# Patient Record
Sex: Male | Born: 1935 | Race: White | Hispanic: No | Marital: Married | Smoking: Former smoker
Health system: Southern US, Community
[De-identification: ages and names within clinical notes are randomized; demographics above are authoritative.]

## PROBLEM LIST (undated history)

## (undated) DIAGNOSIS — H409 Unspecified glaucoma: Secondary | ICD-10-CM

## (undated) DIAGNOSIS — T7840XA Allergy, unspecified, initial encounter: Secondary | ICD-10-CM

## (undated) DIAGNOSIS — I255 Ischemic cardiomyopathy: Secondary | ICD-10-CM

## (undated) DIAGNOSIS — I219 Acute myocardial infarction, unspecified: Secondary | ICD-10-CM

## (undated) DIAGNOSIS — K635 Polyp of colon: Secondary | ICD-10-CM

## (undated) DIAGNOSIS — E039 Hypothyroidism, unspecified: Secondary | ICD-10-CM

## (undated) DIAGNOSIS — E785 Hyperlipidemia, unspecified: Secondary | ICD-10-CM

## (undated) DIAGNOSIS — K5790 Diverticulosis of intestine, part unspecified, without perforation or abscess without bleeding: Secondary | ICD-10-CM

## (undated) DIAGNOSIS — I259 Chronic ischemic heart disease, unspecified: Secondary | ICD-10-CM

## (undated) DIAGNOSIS — E1140 Type 2 diabetes mellitus with diabetic neuropathy, unspecified: Secondary | ICD-10-CM

## (undated) DIAGNOSIS — H269 Unspecified cataract: Secondary | ICD-10-CM

## (undated) DIAGNOSIS — Z95810 Presence of automatic (implantable) cardiac defibrillator: Secondary | ICD-10-CM

## (undated) DIAGNOSIS — R269 Unspecified abnormalities of gait and mobility: Secondary | ICD-10-CM

## (undated) DIAGNOSIS — I509 Heart failure, unspecified: Secondary | ICD-10-CM

## (undated) DIAGNOSIS — K648 Other hemorrhoids: Secondary | ICD-10-CM

## (undated) HISTORY — DX: Allergy, unspecified, initial encounter: T78.40XA

## (undated) HISTORY — DX: Ischemic cardiomyopathy: I25.5

## (undated) HISTORY — DX: Unspecified abnormalities of gait and mobility: R26.9

## (undated) HISTORY — DX: Chronic ischemic heart disease, unspecified: I25.9

## (undated) HISTORY — PX: CHOLECYSTECTOMY, LAPAROSCOPIC: SHX56

## (undated) HISTORY — DX: Other hemorrhoids: K64.8

## (undated) HISTORY — DX: Hyperlipidemia, unspecified: E78.5

## (undated) HISTORY — PX: CARDIAC DEFIBRILLATOR PLACEMENT: SHX171

## (undated) HISTORY — DX: Unspecified glaucoma: H40.9

## (undated) HISTORY — DX: Heart failure, unspecified: I50.9

## (undated) HISTORY — DX: Hypothyroidism, unspecified: E03.9

## (undated) HISTORY — DX: Polyp of colon: K63.5

## (undated) HISTORY — PX: TONSILLECTOMY: SUR1361

## (undated) HISTORY — DX: Type 2 diabetes mellitus with diabetic neuropathy, unspecified: E11.40

## (undated) HISTORY — PX: CHOLECYSTECTOMY: SHX55

## (undated) HISTORY — DX: Unspecified cataract: H26.9

## (undated) HISTORY — DX: Diverticulosis of intestine, part unspecified, without perforation or abscess without bleeding: K57.90

## (undated) HISTORY — PX: EYE SURGERY: SHX253

## (undated) HISTORY — PX: CATARACT EXTRACTION: SUR2

## (undated) HISTORY — DX: Acute myocardial infarction, unspecified: I21.9

## (undated) HISTORY — PX: SHOULDER SURGERY: SHX246

---

## 1999-04-18 ENCOUNTER — Encounter: Admission: RE | Admit: 1999-04-18 | Discharge: 1999-07-17 | Payer: Self-pay | Admitting: Anesthesiology

## 1999-09-25 ENCOUNTER — Encounter: Admission: RE | Admit: 1999-09-25 | Discharge: 1999-12-24 | Payer: Self-pay | Admitting: Anesthesiology

## 2000-02-07 ENCOUNTER — Encounter: Admission: RE | Admit: 2000-02-07 | Discharge: 2000-05-06 | Payer: Self-pay | Admitting: Anesthesiology

## 2000-06-17 ENCOUNTER — Encounter: Admission: RE | Admit: 2000-06-17 | Discharge: 2000-09-15 | Payer: Self-pay | Admitting: Anesthesiology

## 2000-10-03 ENCOUNTER — Encounter: Admission: RE | Admit: 2000-10-03 | Discharge: 2001-01-01 | Payer: Self-pay | Admitting: Anesthesiology

## 2004-08-11 ENCOUNTER — Encounter: Admission: RE | Admit: 2004-08-11 | Discharge: 2004-08-11 | Payer: Self-pay | Admitting: Internal Medicine

## 2004-10-22 DIAGNOSIS — I219 Acute myocardial infarction, unspecified: Secondary | ICD-10-CM

## 2004-10-22 HISTORY — DX: Acute myocardial infarction, unspecified: I21.9

## 2004-11-22 DIAGNOSIS — Z95810 Presence of automatic (implantable) cardiac defibrillator: Secondary | ICD-10-CM

## 2004-11-22 HISTORY — DX: Presence of automatic (implantable) cardiac defibrillator: Z95.810

## 2004-11-29 ENCOUNTER — Inpatient Hospital Stay (HOSPITAL_COMMUNITY): Admission: EM | Admit: 2004-11-29 | Discharge: 2004-12-11 | Payer: Self-pay | Admitting: Emergency Medicine

## 2004-12-01 ENCOUNTER — Encounter: Payer: Self-pay | Admitting: Cardiology

## 2004-12-02 HISTORY — PX: CORONARY ARTERY BYPASS GRAFT: SHX141

## 2004-12-16 ENCOUNTER — Inpatient Hospital Stay (HOSPITAL_COMMUNITY): Admission: EM | Admit: 2004-12-16 | Discharge: 2004-12-19 | Payer: Self-pay | Admitting: Emergency Medicine

## 2004-12-18 ENCOUNTER — Encounter (INDEPENDENT_AMBULATORY_CARE_PROVIDER_SITE_OTHER): Payer: Self-pay | Admitting: *Deleted

## 2005-01-08 ENCOUNTER — Encounter (HOSPITAL_COMMUNITY): Admission: RE | Admit: 2005-01-08 | Discharge: 2005-04-08 | Payer: Self-pay | Admitting: Cardiology

## 2005-04-09 ENCOUNTER — Encounter (HOSPITAL_COMMUNITY): Admission: RE | Admit: 2005-04-09 | Discharge: 2005-07-08 | Payer: Self-pay | Admitting: Cardiology

## 2005-04-19 ENCOUNTER — Ambulatory Visit: Payer: Self-pay | Admitting: Internal Medicine

## 2005-05-04 ENCOUNTER — Ambulatory Visit: Payer: Self-pay | Admitting: Internal Medicine

## 2005-05-11 ENCOUNTER — Inpatient Hospital Stay (HOSPITAL_COMMUNITY): Admission: AD | Admit: 2005-05-11 | Discharge: 2005-05-12 | Payer: Self-pay | Admitting: Internal Medicine

## 2005-05-12 ENCOUNTER — Ambulatory Visit: Payer: Self-pay | Admitting: Cardiology

## 2005-05-28 ENCOUNTER — Ambulatory Visit: Payer: Self-pay

## 2005-06-04 ENCOUNTER — Ambulatory Visit: Payer: Self-pay

## 2005-06-11 ENCOUNTER — Ambulatory Visit: Payer: Self-pay

## 2005-08-13 ENCOUNTER — Ambulatory Visit: Payer: Self-pay | Admitting: Internal Medicine

## 2005-11-13 ENCOUNTER — Ambulatory Visit: Payer: Self-pay | Admitting: Internal Medicine

## 2006-02-18 ENCOUNTER — Encounter (INDEPENDENT_AMBULATORY_CARE_PROVIDER_SITE_OTHER): Payer: Self-pay | Admitting: *Deleted

## 2006-02-18 ENCOUNTER — Ambulatory Visit (HOSPITAL_COMMUNITY): Admission: RE | Admit: 2006-02-18 | Discharge: 2006-02-19 | Payer: Self-pay | Admitting: General Surgery

## 2006-03-04 ENCOUNTER — Ambulatory Visit: Payer: Self-pay | Admitting: Internal Medicine

## 2006-06-11 ENCOUNTER — Ambulatory Visit: Payer: Self-pay | Admitting: Internal Medicine

## 2006-08-23 ENCOUNTER — Ambulatory Visit: Payer: Self-pay

## 2006-08-23 ENCOUNTER — Ambulatory Visit: Payer: Self-pay | Admitting: Internal Medicine

## 2006-12-10 ENCOUNTER — Ambulatory Visit: Payer: Self-pay | Admitting: Internal Medicine

## 2007-03-11 ENCOUNTER — Ambulatory Visit: Payer: Self-pay | Admitting: Internal Medicine

## 2007-05-20 ENCOUNTER — Ambulatory Visit: Payer: Self-pay | Admitting: Internal Medicine

## 2007-06-10 ENCOUNTER — Ambulatory Visit: Payer: Self-pay | Admitting: Internal Medicine

## 2007-06-24 ENCOUNTER — Encounter: Payer: Self-pay | Admitting: Internal Medicine

## 2007-06-24 ENCOUNTER — Ambulatory Visit: Payer: Self-pay | Admitting: Internal Medicine

## 2007-07-31 ENCOUNTER — Ambulatory Visit: Payer: Self-pay | Admitting: Internal Medicine

## 2007-09-09 ENCOUNTER — Ambulatory Visit: Payer: Self-pay | Admitting: Internal Medicine

## 2007-10-28 ENCOUNTER — Ambulatory Visit: Payer: Self-pay | Admitting: Internal Medicine

## 2007-11-25 ENCOUNTER — Inpatient Hospital Stay (HOSPITAL_COMMUNITY): Admission: EM | Admit: 2007-11-25 | Discharge: 2007-11-27 | Payer: Self-pay | Admitting: Emergency Medicine

## 2007-11-25 ENCOUNTER — Ambulatory Visit: Payer: Self-pay | Admitting: Internal Medicine

## 2007-12-10 ENCOUNTER — Ambulatory Visit: Payer: Self-pay

## 2007-12-17 ENCOUNTER — Ambulatory Visit: Payer: Self-pay | Admitting: Internal Medicine

## 2008-02-02 DIAGNOSIS — F3289 Other specified depressive episodes: Secondary | ICD-10-CM | POA: Insufficient documentation

## 2008-02-02 DIAGNOSIS — I509 Heart failure, unspecified: Secondary | ICD-10-CM | POA: Insufficient documentation

## 2008-02-02 DIAGNOSIS — I2510 Atherosclerotic heart disease of native coronary artery without angina pectoris: Secondary | ICD-10-CM | POA: Insufficient documentation

## 2008-02-02 DIAGNOSIS — I219 Acute myocardial infarction, unspecified: Secondary | ICD-10-CM | POA: Insufficient documentation

## 2008-02-02 DIAGNOSIS — F329 Major depressive disorder, single episode, unspecified: Secondary | ICD-10-CM

## 2008-02-02 DIAGNOSIS — E119 Type 2 diabetes mellitus without complications: Secondary | ICD-10-CM

## 2008-03-09 ENCOUNTER — Ambulatory Visit: Payer: Self-pay | Admitting: Internal Medicine

## 2008-06-08 ENCOUNTER — Ambulatory Visit: Payer: Self-pay | Admitting: Internal Medicine

## 2008-06-08 ENCOUNTER — Encounter: Admission: RE | Admit: 2008-06-08 | Discharge: 2008-06-08 | Payer: Self-pay | Admitting: Cardiology

## 2008-09-09 ENCOUNTER — Ambulatory Visit: Payer: Self-pay | Admitting: Internal Medicine

## 2008-12-07 ENCOUNTER — Ambulatory Visit: Payer: Self-pay | Admitting: Internal Medicine

## 2008-12-15 ENCOUNTER — Encounter: Payer: Self-pay | Admitting: Internal Medicine

## 2009-03-09 ENCOUNTER — Encounter: Payer: Self-pay | Admitting: Internal Medicine

## 2009-03-15 DIAGNOSIS — E039 Hypothyroidism, unspecified: Secondary | ICD-10-CM | POA: Insufficient documentation

## 2009-03-15 DIAGNOSIS — Z95810 Presence of automatic (implantable) cardiac defibrillator: Secondary | ICD-10-CM

## 2009-03-15 DIAGNOSIS — Q740 Other congenital malformations of upper limb(s), including shoulder girdle: Secondary | ICD-10-CM | POA: Insufficient documentation

## 2009-03-15 DIAGNOSIS — E785 Hyperlipidemia, unspecified: Secondary | ICD-10-CM | POA: Insufficient documentation

## 2009-03-16 ENCOUNTER — Ambulatory Visit: Payer: Self-pay | Admitting: Internal Medicine

## 2009-06-14 ENCOUNTER — Ambulatory Visit: Payer: Self-pay | Admitting: Internal Medicine

## 2009-06-29 ENCOUNTER — Encounter: Payer: Self-pay | Admitting: Internal Medicine

## 2009-07-15 ENCOUNTER — Encounter: Admission: RE | Admit: 2009-07-15 | Discharge: 2009-07-15 | Payer: Self-pay | Admitting: Orthopedic Surgery

## 2009-09-13 ENCOUNTER — Ambulatory Visit: Payer: Self-pay | Admitting: Internal Medicine

## 2009-09-28 ENCOUNTER — Encounter: Payer: Self-pay | Admitting: Internal Medicine

## 2009-10-07 ENCOUNTER — Emergency Department (HOSPITAL_COMMUNITY): Admission: EM | Admit: 2009-10-07 | Discharge: 2009-10-07 | Payer: Self-pay | Admitting: Emergency Medicine

## 2009-12-13 ENCOUNTER — Ambulatory Visit: Payer: Self-pay | Admitting: Internal Medicine

## 2009-12-16 ENCOUNTER — Encounter: Payer: Self-pay | Admitting: Internal Medicine

## 2010-02-27 LAB — BASIC METABOLIC PANEL: Creatinine: 1.5 mg/dL — AB (ref <=1.3)

## 2010-02-27 LAB — LIPID PANEL: LDL Cholesterol: 47 mg/dL

## 2010-02-28 ENCOUNTER — Ambulatory Visit: Payer: Self-pay | Admitting: Internal Medicine

## 2010-06-01 ENCOUNTER — Ambulatory Visit: Payer: Self-pay | Admitting: Internal Medicine

## 2010-06-05 ENCOUNTER — Ambulatory Visit: Payer: Self-pay | Admitting: Cardiology

## 2010-07-05 ENCOUNTER — Encounter: Payer: Self-pay | Admitting: Internal Medicine

## 2010-07-18 ENCOUNTER — Ambulatory Visit (HOSPITAL_COMMUNITY): Admission: RE | Admit: 2010-07-18 | Discharge: 2010-07-18 | Payer: Self-pay | Admitting: Cardiology

## 2010-07-18 ENCOUNTER — Ambulatory Visit: Payer: Self-pay | Admitting: Cardiology

## 2010-09-05 ENCOUNTER — Ambulatory Visit: Payer: Self-pay | Admitting: Cardiology

## 2010-09-07 ENCOUNTER — Ambulatory Visit: Payer: Self-pay | Admitting: Internal Medicine

## 2010-10-06 ENCOUNTER — Encounter: Payer: Self-pay | Admitting: Internal Medicine

## 2010-11-12 ENCOUNTER — Encounter: Payer: Self-pay | Admitting: Surgery

## 2010-11-20 ENCOUNTER — Encounter: Payer: Self-pay | Admitting: Cardiology

## 2010-11-20 DIAGNOSIS — I255 Ischemic cardiomyopathy: Secondary | ICD-10-CM | POA: Insufficient documentation

## 2010-11-20 DIAGNOSIS — I259 Chronic ischemic heart disease, unspecified: Secondary | ICD-10-CM | POA: Insufficient documentation

## 2010-11-20 DIAGNOSIS — E1140 Type 2 diabetes mellitus with diabetic neuropathy, unspecified: Secondary | ICD-10-CM | POA: Insufficient documentation

## 2010-11-20 DIAGNOSIS — E785 Hyperlipidemia, unspecified: Secondary | ICD-10-CM | POA: Insufficient documentation

## 2010-11-20 DIAGNOSIS — E119 Type 2 diabetes mellitus without complications: Secondary | ICD-10-CM | POA: Insufficient documentation

## 2010-11-20 DIAGNOSIS — I219 Acute myocardial infarction, unspecified: Secondary | ICD-10-CM | POA: Insufficient documentation

## 2010-11-20 DIAGNOSIS — E039 Hypothyroidism, unspecified: Secondary | ICD-10-CM | POA: Insufficient documentation

## 2010-11-20 DIAGNOSIS — I119 Hypertensive heart disease without heart failure: Secondary | ICD-10-CM | POA: Insufficient documentation

## 2010-11-23 NOTE — Cardiovascular Report (Signed)
Summary: Office Visit Remote   Office Visit Remote   Imported By: Sallee Provencal 12/19/2009 11:12:01  _____________________________________________________________________  External Attachment:    Type:   Image     Comment:   External Document

## 2010-11-23 NOTE — Cardiovascular Report (Signed)
Summary: Office Visit Remote   Office Visit Remote   Imported By: Sallee Provencal 07/11/2010 14:12:35  _____________________________________________________________________  External Attachment:    Type:   Image     Comment:   External Document

## 2010-11-23 NOTE — Assessment & Plan Note (Signed)
Summary: re  Medications Added GLIMEPIRIDE 2 MG TABS (GLIMEPIRIDE) 1 by mouth two times a day SYNTHROID 125 MCG TABS (LEVOTHYROXINE SODIUM) 6 days per week AMIODARONE HCL 200 MG TABS (AMIODARONE HCL) Take 1/2 tablet by mouth daily VITAMIN D3 1000 UNIT CAPS (CHOLECALCIFEROL) once daily      Allergies Added: NKDA  Visit Type:  Follow-up Primary Provider:  Piedad Climes, MD   History of Present Illness: Gregory [name] returns today for followup.  He is a 77 yo man who is s/p ICD implant secondary to a ICM and non-sustained VT.  He developed a medtronic [phone] lead fx. and underwent ICD lead revision two years ago.  He returns today for eval.  He has seen Dr. Mare Ferrari and thought to be doing well.  He denies sob or peripheral edema.  No ICD therapies have been noted.  Current Medications (verified): 1)  Aspirin Ec 325 Mg Tbec (Aspirin) .... Take One Tablet By Mouth Daily 2)  Glimepiride 2 Mg Tabs (Glimepiride) .Marland Kitchen.. 1 By Mouth Two Times A Day 3)  Lipitor 20 Mg Tabs (Atorvastatin Calcium) .... Take One Tablet By Mouth Daily. 4)  Furosemide 40 Mg Tabs (Furosemide) .... Take 1/2 Tablet By Mouth Daily. 5)  Ramipril 1.25 Mg Caps (Ramipril) .... Take One Capsule By Mouth Daily 6)  Coreg Cr 20 Mg Xr24h-Cap (Carvedilol Phosphate) .Marland Kitchen.. 1 By Mouth Once Daily 7)  Gabapentin 300 Mg Caps (Gabapentin) .... 2 Capsules Once Daily 8)  Synthroid 125 Mcg Tabs (Levothyroxine Sodium) .... 6 Days Per Week 9)  Amiodarone Hcl 200 Mg Tabs (Amiodarone Hcl) .... Take 1/2 Tablet By Mouth Daily 10)  Vitamin D3 1000 Unit Caps (Cholecalciferol) .... 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    Vital Signs:  Patient profile:   77 year old male Height:      66 inches Weight:      184 pounds BMI:     29.81 Pulse rate:   54 / minute BP sitting:   110 / 80  (left arm)  Vitals Entered By: Margaretmary Bayley CMA (Feb 28, 2010 3:19 PM)  Physical Exam  General:  Well developed, well nourished, in no acute distress. Head:  normocephalic and atraumatic Eyes:  He wears a patch over his right eye Mouth:  Teeth, gums and palate normal. Oral mucosa normal. Neck:  Neck supple, no JVD. No masses, thyromegaly or abnormal cervical nodes. Chest Wall:  Well healed ICD incision Lungs:  Clear bilaterally to auscultation with no wheezes, rales, or rhonchi. Heart:  RRR with normal S1 and S2 and laterally displaced PMI.  No murmurs were appreciated. Abdomen:  Bowel sounds positive; abdomen soft and non-tender without masses, organomegaly, or hernias noted. No hepatosplenomegaly. Msk:  Back normal, normal gait. Muscle strength and tone normal. Pulses:  pulses normal in all 4 extremities Extremities:  No clubbing or cyanosis. Neurologic:  Alert and oriented x 3.    ICD Specifications Following MD:  Cristopher Peru, MD     ICD Vendor:  Medtronic     ICD Model Number:  [phone]     ICD  Serial Number:  [id number] ICD DOI:  05/11/2005     ICD Implanting MD:  Cristopher Peru, MD  Lead 1:    Location: RV     DOI: 05/11/2005     Model #: [id number]     Serial #: [id number]     Status: capped Lead 2:    Location: RV     DOI: 11/26/2007     Model #: [id number]     Serial #[id number]     Status: active  Indications::  ICM   ICD Follow Up Battery Voltage:  3.07 V     Charge Time:  8.59 seconds     Underlying rhythm:  SR   ICD Device Measurements Right Ventricle:  Amplitude: 8.1 mV, Impedance: 400  ohms, Threshold: 1.0 V at 0.4 msec Shock Impedance: 63 ohms   Episodes MS Episodes:  0     Percent Mode Switch:  0     Coumadin:  No Shock:  0     ATP:  0     Nonsustained:  0     Atrial Therapies:  0 Ventricular Pacing:  <0.1%  Brady Parameters Mode VVI     Lower Rate Limit:  40      Tachy Zones VF:  200     VT:  200 (VIA VF)     VT1:  176     Tech Comments:  NORMAL DEVICE FUNCTION.  NO CHANGES MADE.  CARELINK CHECK 06-01-10. Shelly Bombard  Feb 28, 2010 3:11 PM MD Comments:  Agree with above.  Impression & Recommendations:  Problem # 1:  AUTOMATIC IMPLANTABLE CARDIAC DEFIBRILLATOR SITU (ICD-V45.02) His device is stable with normal function.  Will recheck in several months.  Problem # 2:  CAD (ICD-414.00) He denies anginal symptoms.  Continue current meds. His updated medication list for this problem includes:    Aspirin Ec 325 Mg Tbec (Aspirin) .Marland Kitchen... Take one tablet by mouth daily    Ramipril 1.25 Mg Caps (Ramipril) .Marland Kitchen... Take one capsule by mouth daily    Coreg Cr 20 Mg Xr24h-cap (Carvedilol phosphate) .Marland Kitchen... 1 by mouth once daily  Problem # 3:  CHF (ICD-428.0) He remains class 2.  Continue current meds. His updated medication list for this problem includes:    Aspirin Ec 325 Mg Tbec (Aspirin) .Marland Kitchen... Take one tablet by mouth daily    Furosemide 40 Mg Tabs (Furosemide) .Marland Kitchen... Take 1/2 tablet by mouth daily.    Ramipril 1.25 Mg Caps (Ramipril) .Marland Kitchen... Take one capsule by mouth daily    Coreg Cr 20 Mg Xr24h-cap (Carvedilol phosphate) .Marland Kitchen... 1 by mouth once daily    Amiodarone Hcl 200 Mg Tabs (Amiodarone hcl) .Marland Kitchen... Take 1/2 tablet by mouth daily  Patient Instructions: 1)  Your physician recommends that you schedule a follow-up appointment in: 12 months with Dr Lovena Le

## 2010-11-23 NOTE — Cardiovascular Report (Signed)
Summary: Office Visit Remote   Office Visit Remote   Imported By: Sallee Provencal 10/09/2010 16:05:34  _____________________________________________________________________  External Attachment:    Type:   Image     Comment:   External Document

## 2010-11-23 NOTE — Letter (Signed)
Summary: Remote Device Check  [facility], [place]  [id number] [place]   [place], [place] [address]   Phone: [phone]  Fax: [phone]     October 06, 2010 MRN: [id number]   [place] [address] [place], [place]  [address]   Dear Mr. Tudisco,   Your remote transmission was recieved and reviewed by your physician.  All diagnostics were within normal limits for you.  __X___Your next transmission is scheduled for:  12-07-2010.  Please transmit at any time this day.  If you have a wireless device your transmission will be sent automatically.   Sincerely,  Shelly Bombard

## 2010-11-23 NOTE — Letter (Signed)
Summary: Remote Device Check  [facility], [place]  [id number] [place]   [place], [place] [address]   Phone: [phone]  Fax: [phone]     December 16, 2009 MRN: [id number]   [place] [address] [place], [place]  [address]   Dear Gregory [name],   Your remote transmission was recieved and reviewed by your physician.  All diagnostics were within normal limits for you.   ___X___Your next office visit is scheduled for:  MAY 2011 [place]. Please call our office to schedule an appointment.    Sincerely,  Hotel manager

## 2010-11-23 NOTE — Letter (Signed)
Summary: Remote Device Check  [facility], [place]  [id number] [place]   [place], [place] [address]   Phone: [phone]  Fax: [phone]     July 05, 2010 MRN: [id number]   [place] [address] [place], [place]  [address]   Dear Gregory [name],   Your remote transmission was recieved and reviewed by your physician.  All diagnostics were within normal limits for you.  __X___Your next transmission is scheduled for:   09-07-10.  Please transmit at any time this day.  If you have a wireless device your transmission will be sent automatically.   Sincerely,  Shelly Bombard

## 2010-11-23 NOTE — Assessment & Plan Note (Signed)
Summary: [email or web address]  Medications Added ASPIRIN EC 325 MG TBEC (ASPIRIN) Take one tablet by mouth daily GLIMEPIRIDE 2 MG TABS (GLIMEPIRIDE) 1 by mouth once daily LIPITOR 20 MG TABS (ATORVASTATIN CALCIUM) Take one tablet by mouth daily. FUROSEMIDE 40 MG TABS (FUROSEMIDE) Take 1/2 tablet by mouth daily. RAMIPRIL 1.25 MG CAPS (RAMIPRIL) Take one capsule by mouth daily COREG CR 20 MG XR24H-CAP (CARVEDILOL PHOSPHATE) 1 by mouth once daily GABAPENTIN 300 MG CAPS (GABAPENTIN) 2 capsules once daily SYNTHROID 125 MCG TABS (LEVOTHYROXINE SODIUM) 1 by mouth once daily      Allergies Added: NKDA  CC:  chest spam ?.  History of Present Illness: Mr. Leeb returns today for followup.  He is a 77 yo man who is s/p ICD implant secondary to a ICM and non-sustained VT.  He developed a medtronic [phone] lead fx. and underwent ICD lead revision one year ago.  He returns today for eval.  He notes fleeting (seconds) episodes of c/p whic is not related to exertion.  He has seen Dr. Mare Ferrari with the thought at this time appropriately being a period of watchful waiting.  He denies sob or peripheral edema.  No ICD therapies have been noted.  Current Medications (verified): 1)  Aspirin Ec 325 Mg Tbec (Aspirin) .... Take One Tablet By Mouth Daily 2)  Glimepiride 2 Mg Tabs (Glimepiride) .Marland Kitchen.. 1 By Mouth Once Daily 3)  Lipitor 20 Mg Tabs (Atorvastatin Calcium) .... Take One Tablet By Mouth Daily. 4)  Furosemide 40 Mg Tabs (Furosemide) .... Take 1/2 Tablet By Mouth Daily. 5)  Ramipril 1.25 Mg Caps (Ramipril) .... Take One Capsule By Mouth Daily 6)  Coreg Cr 20 Mg Xr24h-Cap (Carvedilol Phosphate) .Marland Kitchen.. 1 By Mouth Once Daily 7)  Gabapentin 300 Mg Caps (Gabapentin) .... 2 Capsules Once Daily 8)  Synthroid 125 Mcg Tabs (Levothyroxine Sodium) .Marland Kitchen.. 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    Vital Signs:  Patient profile:   77 year old male Height:      66 inches Weight:      187.75 pounds BMI:     30.41 Pulse rate:   67 / minute Pulse rhythm:   regular BP sitting:   119 / 73  (left arm) Cuff size:   large  Vitals Entered By: Darrick Meigs (Mar 16, 2009 2:34 PM)  Physical Exam  General:  Well developed, well nourished, in no acute distress. Head:  normocephalic and atraumatic Eyes:  He wears a patch over his right eye Mouth:  Teeth, gums and palate normal. Oral mucosa normal. Neck:  Neck supple, no JVD. No masses, thyromegaly or abnormal cervical nodes. Chest Wall:  Well healed ICD incision Lungs:  Clear bilaterally to auscultation and percussion. Heart:  RRR with normal S1 and S2 and laterally displaced PMI.  No murmurs were appreciated. Abdomen:  Bowel sounds positive; abdomen soft and non-tender without masses, organomegaly, or hernias noted. No hepatosplenomegaly. Msk:  Back normal, normal gait. Muscle strength and tone normal. Pulses:  pulses normal in all 4 extremities Extremities:  No clubbing or cyanosis. Neurologic:  Alert and oriented x 3.   ICD Specifications Following MD:  Cristopher Peru, MD     ICD Vendor:  Medtronic     ICD Model Number:  [id number]     ICD Serial Number:  [id number] H ICD DOI:  05/11/2005     ICD Implanting MD:  Cristopher Peru, MD  Lead 1:    Location: RV     DOI: 05/11/2005     Model #: [id number]     Serial #: [id number]     Status: capped Lead 2:    Location: RV     DOI: 11/26/2007     Model #: [id number]      Serial #[id number]     Status: active  Indications::  ICM   ICD Specs Remote Check?  No Battery Voltage:  3.14 V     Charge Time:  8.19 seconds     Underlying rhythm:  SR@67    ICD Device Measurements [place]:  Right Ventricle:  Amplitude: 3.9 mV, Impedance: 440 ohms, Threshold: 1.5 V at 0.3 msec Left Ventricle:  Shock Impedance: 61 ohms   Episodes Coumadin:  No Shock:  0     ATP:  0     Nonsustained:  0     Ventricular Pacing:  <0.1&  Brady Parameters Mode VVI     Lower Rate Limit:  40      Tachy Zones VF:  200     VT:  250     VT1:  176     Next Remote Date:  06/14/2009     Next Cardiology Appt Due:  02/19/2010 Tech Comments:  No changes [facility] Friendly, LPN  May 26, [id number] PM  MD Comments:  Normal device function.  Impression & Recommendations:  Problem # 1:  AUTOMATIC IMPLANTABLE CARDIAC DEFIBRILLATOR SITU (ICD-V45.02) His device is functioning normally today.  Will followup in several months.  Problem # 2:  CHF (ICD-428.0) His CHF is well controlled at the present.  Continue current meds as noted below. His updated medication list for this problem includes:    Aspirin Ec 325 Mg Tbec (Aspirin) .Marland Kitchen... Take one tablet by mouth daily    Furosemide 40 Mg Tabs (Furosemide) .Marland Kitchen... Take 1/2 tablet by mouth daily.    Ramipril 1.25 Mg Caps (Ramipril) .Marland Kitchen... Take one capsule by mouth daily    Coreg Cr 20 Mg Xr24h-cap (Carvedilol phosphate) .Marland Kitchen... 1 by mouth once daily  Problem # 3:  CAD (ICD-414.00) Currently his chest pain is fleeting and I do not think represent recurrent ischemia. His updated medication list for this problem includes:    Aspirin Ec 325 Mg Tbec (Aspirin) .Marland Kitchen... Take one tablet by mouth daily    Ramipril 1.25 Mg Caps (Ramipril) .Marland Kitchen... Take one capsule by mouth daily    Coreg Cr 20 Mg Xr24h-cap (Carvedilol phosphate) .Marland Kitchen... 1 by mouth once daily

## 2010-12-07 ENCOUNTER — Encounter: Payer: Self-pay | Admitting: Internal Medicine

## 2010-12-07 ENCOUNTER — Encounter (INDEPENDENT_AMBULATORY_CARE_PROVIDER_SITE_OTHER): Payer: Medicare Other

## 2010-12-07 DIAGNOSIS — I472 Ventricular tachycardia: Secondary | ICD-10-CM

## 2011-01-08 ENCOUNTER — Encounter: Payer: Self-pay | Admitting: *Deleted

## 2011-01-09 ENCOUNTER — Ambulatory Visit: Payer: Self-pay | Admitting: Cardiology

## 2011-01-12 ENCOUNTER — Ambulatory Visit (INDEPENDENT_AMBULATORY_CARE_PROVIDER_SITE_OTHER): Payer: Medicare Other | Admitting: Cardiology

## 2011-01-12 ENCOUNTER — Encounter: Payer: Self-pay | Admitting: Cardiology

## 2011-01-12 VITALS — BP 100/60 | HR 61 | Wt 183.0 lb

## 2011-01-12 DIAGNOSIS — I2510 Atherosclerotic heart disease of native coronary artery without angina pectoris: Secondary | ICD-10-CM

## 2011-01-12 DIAGNOSIS — I509 Heart failure, unspecified: Secondary | ICD-10-CM

## 2011-01-12 DIAGNOSIS — Z95810 Presence of automatic (implantable) cardiac defibrillator: Secondary | ICD-10-CM

## 2011-01-12 DIAGNOSIS — I4510 Unspecified right bundle-branch block: Secondary | ICD-10-CM

## 2011-01-12 NOTE — Assessment & Plan Note (Signed)
No shocks from his defibrillator.  He had a lead replacement in February 2009.  Continue followup through the pacemaker defibrillator clinic at [facility] heart [facility]

## 2011-01-12 NOTE — Progress Notes (Signed)
History of Present Illness: No longer goes to [facility].  No chest pain. NO increase in dyspnea.The patient has an ICD.  He's had no shocks from his defibrillator.  He has not had to take any recent nitroglycerin.  He states that his blood sugars at home have been good and he has not had any hypoglycemic episodes.  He no longer goes to the [facility] because it was too expensive to drive back-and-forth  Current Outpatient Prescriptions  Medication Sig Dispense Refill  . acetaminophen (TYLENOL) 500 MG tablet Take 500 mg by mouth as needed.        Marland Kitchen amiodarone (PACERONE) 200 MG tablet Take 100 mg by mouth daily.        Marland Kitchen aspirin 325 MG tablet Take 325 mg by mouth daily.        Marland Kitchen atorvastatin (LIPITOR) 20 MG tablet Take 20 mg by mouth daily.        . calcium carbonate (TUMS EX) 750 MG chewable tablet Chew 1 tablet by mouth daily.        . carvedilol (COREG) 12.5 MG tablet Take 12.5 mg by mouth 2 (two) times daily.        . Cholecalciferol (VITAMIN D-3 PO) Take by mouth daily.        . furosemide (LASIX) 40 MG tablet Take 20 mg by mouth daily.       Marland Kitchen gabapentin (NEURONTIN) 300 MG capsule Take 600 mg by mouth daily.        Marland Kitchen glimepiride (AMARYL) 2 MG tablet Take 2 mg by mouth 2 (two) times daily.        . IRON PO Take by mouth daily.        Marland Kitchen levothyroxine (SYNTHROID, LEVOTHROID) 125 MCG tablet Take 125 mcg by mouth daily. 6 DAYS OUT OF 7 DAYS       . Multiple Vitamins-Minerals (CENTRUM SILVER PO) Take by mouth daily.        . nitroGLYCERIN (NITROSTAT) 0.4 MG SL tablet Place 0.4 mg under the tongue every 5 (five) minutes as needed.        Marland Kitchen TEMAZEPAM PO Take by mouth at bedtime as needed.          No Known Allergies  Patient Active Problem List  Diagnoses  . HYPOTHYROIDISM  . DM  . DYSLIPIDEMIA  . DEPRESSION  . MI  . CAD  . CHF  . ANOMALY, ARM, CONGENITAL  . AUTOMATIC IMPLANTABLE CARDIAC DEFIBRILLATOR SITU  . Ischemic heart disease  . Myocardial infarction  . Hypothyroidism  . Diabetes  mellitus  . Dyslipidemia  . Essential hypertension  . Ischemic cardiomyopathy  . Diabetic neuropathy    History  Smoking status  . Former Smoker  Smokeless tobacco  . Not on file    History  Alcohol Use: Not on file    No family history on file.  Review of Systems: Constitutional: no fever chills diaphoresis or fatigue or change in weight.  Head and neck: no hearing loss, no epistaxis, no photophobia or visual disturbance. Respiratory: No cough, shortness of breath or wheezing. Cardiovascular: No chest pain peripheral edema, palpitations. Gastrointestinal: No abdominal distention, no abdominal pain, no change in bowel habits hematochezia or melena. Genitourinary: No dysuria, no frequency, no urgency, no nocturia. Musculoskeletal:No arthralgias, no back pain, no gait disturbance or myalgias. Neurological: No dizziness, no headaches, no numbness, no seizures, no syncope, no weakness, no tremors. Hematologic: No lymphadenopathy, no easy bruising. Psychiatric: No confusion, no hallucinations, no sleep disturbance.  Physical Exam: Weight 183, unchanged.  Pulse is 61.  Vital signs as recorded.  The general appearance reveals a well-developed well-nourished gentleman in no distress.  He wears a patch over his right eye.  Extraocular Movements are full.  There is no scleral icterus.  The mouth and pharynx are normal.  The neck is supple.  The carotids reveal no bruits.  The jugular venous pressure is normal.  The thyroid is not enlarged.  There is no lymphadenopathy.The chest is clear to percussion and auscultation. There are no rales or rhonchi. Expansion of the chest is symmetrical.The precordium is quiet.  The first heart sound is normal.  The second heart sound is physiologically split.  There is no murmur gallop rub or click.  There is no abnormal lift or heave.The abdomen is soft and nontender. Bowel sounds are normal. The liver and spleen are not enlarged. There Are no abdominal  masses. There are no bruits.The pedal pulses are good.  There is no phlebitis or edema.  There is no cyanosis or clubbing.Strength is normal and symmetrical in all extremities.  There is no lateralizing weakness.  There are no sensory deficits.The skin is warm and dry.  There is no rash.    Assessment / Plan:

## 2011-01-12 NOTE — Assessment & Plan Note (Signed)
The patient has had no recurrent angina.  Continue same medication.

## 2011-01-12 NOTE — Assessment & Plan Note (Signed)
The patient is not experiencing any orthopnea or paroxysmal nocturnal dyspnea or peripheral edema.  Continue present medication.  Continue low-sodium diet.

## 2011-01-18 NOTE — Cardiovascular Report (Signed)
Summary: Office Visit   Office Visit   Imported By: Sallee Provencal 01/09/2011 15:26:13  _____________________________________________________________________  External Attachment:    Type:   Image     Comment:   External Document

## 2011-01-18 NOTE — Letter (Signed)
Summary: Remote Device Check  [facility], [place]  [id number] [place]   [place], [place] [address]   Phone: [phone]  Fax: [phone]     January 08, 2011 MRN: [id number]   [place] [address] [place], [place]  [address]   Dear Mr. Rostron,   Your remote transmission was recieved and reviewed by your physician.  All diagnostics were within normal limits for you.   __X____Your next office visit is scheduled for: 03-09-2011 @ 930 with Dr Lovena Le.     Sincerely,  Shelly Bombard

## 2011-03-06 NOTE — Assessment & Plan Note (Signed)
[name]                         ELECTROPHYSIOLOGY OFFICE NOTE   Gregory [name], Gregory [name]                     MRN:          [id number]  DATE:10/28/2007                            DOB:          03/31/1933    [name] returns today for follow-up.  He is a very pleasant patient  of Dr. Lorelle Gibbs with a history of ischemic cardiomyopathy,  congestive heart failure and nonsustained VT who is status post ICD  insertion.  He returns today for follow-up.  His heart failure has been  fairly well-controlled.  He does have very mild episodes of dizziness at  times but mostly these are infrequent.  He denies peripheral edema.   MEDICATIONS:  1. Aspirin 325 a day.  2. Altace 1.25 daily.  3. Glyburide 2 mg daily.  4. Lipitor 20 mg daily.  5. Amiodarone 200 mg 1/2-tablet daily.  6. Coreg 20 mg CR daily.  7. Synthroid 125 mcg daily.  8. Lasix 20 mg daily.   PHYSICAL EXAM:  He is a pleasant, well-appearing man in no acute  distress.  Blood pressure was 96/53, the pulse 66 and regular, respirations were  18.  Weight was 189 pounds, unchanged from prior visits.  NECK:  Revealed no jugular distention.  LUNGS:  Clear bilaterally to auscultation. No wheezes, rales or rhonchi  are present. There is no increased work of breathing.  CARDIOVASCULAR:  Regular rate and rhythm with normal S1 and S2.  EXTREMITIES:  Demonstrated no cyanosis, clubbing or edema.   Interrogation of his defibrillator demonstrates Medtronic Maximo with R  waves of  9 millivolts, the impedance was 416, the threshold was 1 volt  at 0.2 milliseconds.  There were no intercurrent ICD therapies.   IMPRESSION:  1. Ischemic cardiomyopathy.  2. Nonsustained ventricular tachycardia.  3. Congestive heart failure.  4. Status post ICD insertion.   DISCUSSION:  Overall Gregory [name] is stable and his defibrillator is  working normally.  I will plan to see him back in the office in 1 year  for ICD  follow-up.  He will also follow-up with Dr. Mare [name] as  previously scheduled.     Gregory [name]. Gregory Le, MD  Electronically Signed    Gregory [name]  DD: 10/28/2007  DT: 10/28/2007  Job #: [id number]   cc:   Gregory [name], M.D.

## 2011-03-06 NOTE — Assessment & Plan Note (Signed)
[place]                         ELECTROPHYSIOLOGY OFFICE NOTE   JEET, HEARING                     MRN:          [id number]  DATE:03/09/2008                            DOB:          12/05/1932    Mr. Steege returns today for followup.  He is a pleasant man with a  history of congestive heart failure and ischemic cardiomyopathy and  nonsustained VT, status post ICD insertion.  He had a prophylactic  defibrillator placed back in 2006.  He subsequently has developed a 6949  lead fracture and had multiple ICD shocks and hospitalization several  weeks ago.  He underwent insertion of a new Medtronic defibrillator lead  and returns today for followup.  He denies chest pain or shortness of  breath today and otherwise had no specific complaints.   PRESENT MEDICATIONS:  1. Aspirin 325 a day.  2. Altace 1.25 daily.  3. Glyburide 2 mg a day.  4. Lipitor 20 a day.  5. Amiodarone 200 mg 1/2 tablet daily.  6. Furosemide 40 mg 1/2 tablet daily.  7. Multivitamin.  8. Gabapentin.  9. Doxycycline.  10.He is also on Synthroid 125 mcg daily.   PHYSICAL EXAMINATION:  GENERAL:  He is a pleasant man, in no acute  distress.  VITAL SIGNS:  Blood pressure was 130/59, the pulse 56 and regular, the  respirations were 18, the weight was 191 pounds.  NECK:  Revealed no jugular venous distention.  LUNGS:  Clear bilaterally to auscultation.  No wheezes, rales, or  rhonchi are present.  CARDIOVASCULAR:  Revealed a regular rate and rhythm.  Normal S1 and S2.  EXTREMITIES:  Demonstrated no edema. The ICD insertion site was healed  nicely.   Interrogation of his defibrillator demonstrates a [place].  The  R-waves were 5, the impedance 424, the threshold 1 V at 0.3.  The  battery voltage was 3.19 V.  There were no intercurrent IC therapies.  His outputs were decreased in the right ventricle to 2.5 at 0.5 today.   IMPRESSION:  1. Ischemic cardiomyopathy.  2. Nonsustained ventricular tachycardia.  3. Congestive heart failure.  4. Status post implantable cardioverter defibrillator lead function,      with recent revision after the patient presented with multiple      implantable cardioverter defibrillator shocks secondary to      noise on his defibrillator lead.  Overall, the patient is stable.      Will plan to see the patient back in the office in approximately 1      year.     Champ Mungo. Lovena Le, MD  Electronically Signed    GWT/MedQ  DD: 03/09/2008  DT: 03/09/2008  Job #: [id number]   cc:   Darlin Coco, M.D.

## 2011-03-06 NOTE — Discharge Summary (Signed)
Gregory [name], Gregory [name]              ACCOUNT NO.:  [id number]   MEDICAL RECORD NO.:  [id number]          PATIENT TYPE:  INP   LOCATION:  2034                         FACILITY:  [facility]   PHYSICIAN:  Marijo Conception. Wall, MD, FACCDATE OF BIRTH:  1933-01-09   DATE OF ADMISSION:  11/25/2007  DATE OF DISCHARGE:                               DISCHARGE SUMMARY   ALLERGIES:  This patient has no known drug allergies.   Time for this dictation and examination greater than 35 minutes.   FINAL DIAGNOSES:  1. Discharging day #1 after implant of supplementary right ventricular      lead for cardioverter defibrillator by Dr. Cristopher Peru.  Admit      with inappropriate ICD discharges x9 secondary to fracture of      Medtronic 6949 lead.  2. Venogram November 26, 2007.  No occlusion of left subclavian vein.   SECONDARY DIAGNOSES:  1. New York Heart Association class II chronic systolic congestive      heart failure.  2. Diabetes non-insulin-dependent diagnosed greater than 10 years ago.  3. Three vessel coronary artery disease status post coronary artery      bypass graft surgery in 2006.  4. Right bundle branch block.  5. Cardioverter defibrillator implanted 2006 secondary to coronary      artery disease with LAD 100% occlusion, cardiogenic shock, and      emergent CABG.  6. Post CABG atrial fibrillation.  Patient placed on amiodarone.  7. At catheterization 2006 ejection fraction 20% and ischemic      cardiomyopathy with akinesis of the anterior wall and apex.  8. Echocardiogram November 17, 2007, EF 25%.  9. Right bundle branch block.   PROCEDURES:  1. November 26, 2007:  Venogram and ICD lead revision by Dr. Cristopher Peru.  2. Defibrillator threshold study less than or equal to 15 joules.      There was a pocket revision as well.  The patient has a Maximo VR      7232 CX cardioverter defibrillator.   BRIEF HISTORY:  Gregory [name] is a 77 year old male.  He has a  history of ischemic  cardiomyopathy.  He had a myocardial infarction in  2006.  This led to an urgent left heart catheterization study showing  that the LAD and the right coronary artery were jeopardized.  The  patient was in cardiogenic shock.  He needed an intra-aortic balloon  pump, and then underwent emergent coronary artery bypass graft surgery.  At catheterization at that time his ejection fraction was 20%.  He had  akinesis of the anterior wall and apex.  In the postoperative period he  had atrial fibrillation.  He was placed on Coumadin which he still takes  as well as amiodarone.  He also had a Medtronic Maximo [id number]  cardioverter defibrillator implanted for ischemic cardiomyopathy,  nonsustained ventricular tachycardia at about the time of bypass  surgery.   Recently he had a followup 2D echocardiogram at the office of Dr.  Mare Ferrari.  His ejection fraction remained at 25%.  The patient seems  very functional, and he has suspected New York Heart Association class  II chronic systolic congestive heart failure.   Today, February 3, the patient was up and about.  He felt fine.  About  10 in the morning he was doing his morning ablutions.  He had just  shaved and then was brushing his teeth.  Suddenly he was interrupted by  ICD discharges.  He felt four discharges.  He came to the emergency  room.  Interrogation of the device showed nine inappropriate discharges.  Electrical parameters on interrogation implicate a fractured [id number]  Medtronic right ventricular lead.   HOSPITAL COURSE:  After presentation through the emergency room with ICD  discharges which were inappropriate the device was turned off.  The  patient has been feeling fine since.  He was seen by Dr. Cristopher Peru.  Diagnosis is fractured 6949 right ventricular lead.  The patient is  scheduled for revision.  This took place February 4.  Was proceeded by  venogram to check for a flow in the left subclavian vein to the heart.  Venogram  showed good flow, and the patient then received a subsequent  right ventricular lead.  He has done well in the post procedural period.  He has been in sinus rhythm discharging post procedure day #1, November 27, 2007.  His medications have stayed the same.  The incision is doing  well.  There is no hematoma there.  He will go home with his regular  medications as well as a five day course of antibiotics.  He is asked to  keep his incision dry for the next seven days, to sponge bathe until  Wednesday, February 11.  Mobility of the left arm has been discussed  with the patient.   MEDICATIONS AT DISCHARGE:  1. Keflex 500 mg tablets one tab 1/2 hour before breakfast, lunch,      dinner, and bedtime.  2. Amiodarone 200 mg daily.  3. Altace 1.25 mg daily.  4. Furosemide 400 mg daily.  5. Lipitor 20 mg daily at bedtime.  6. Synthroid 125 mcg daily.  7. Glimepiride 2 mg daily.  8. Coreg CR 20 mg daily.  9. Enteric-coated aspirin 325 mg daily.  10.Gabapentin 300 mg two tabs at bedtime.  11.Multivitamin daily.  12.Ferrous sulfate 65 mg daily.  13.He is to take Tums as he has done prior to this admission.   FOLLOWUP:  He has followup at [facility] on Wednesday, February 18, at 9:40 at the [facility] to check the  incision.  He will see Dr. Mare Ferrari Friday, May 1, at 11 o'clock.  He  is free to call Dr. Mare Ferrari if he needs to see him earlier.   LABORATORY STUDIES PERTINENT TO THIS ADMISSION:  Complete blood count:  Hemoglobin 14.6, hematocrit 42.4, white cells 11, platelets 157.  Serial  electrolytes:  Sodium 133, potassium 4, chloride 97, bicarbonate 27, BUN  13, creatinine 1.63, glucose 90.  Protime this admission 14.3.  INR 1.1.  Troponin I studies 0.61, then 0.61 after nine ICD discharges.      Sueanne Margarita, PA      Marcello Moores C. Verl Blalock, MD, [facility]  Electronically Signed    GM/MEDQ  D:  11/27/2007  T:  11/28/2007  Job:  [id number]   cc:    Darlin Coco, M.D.  Champ Mungo. Lovena Le, MD  Burnard Bunting, M.D.

## 2011-03-06 NOTE — Assessment & Plan Note (Signed)
[place]                         ELECTROPHYSIOLOGY OFFICE NOTE   BAELIN, PETROSSIAN                     MRN:          [id number]  DATE:03/11/2007                            DOB:          07-03-1933    Mr. Mckeag returns today for followup. He is a very pleasant, middle-age  man with a history of ischemic cardiomyopathy, congestive heart failure  and nonsustained VT who is status post ICD insertion. He returns today  for followup. The patient has dyspnea with exertion. He recently  underwent exercise treadmill testing with Dr. Mare Ferrari and was told  that he did not have any severe ischemia. The patient does try to be  active but notes when he tries to get out and do much in the way of  strenuous activity he gets short of breath and fatigued. His heart  failure is really class 2. He denies any intercurrent ICD therapies. He  denies peripheral edema.   PHYSICAL EXAMINATION:  GENERAL:  He is a pleasant, middle-aged man who  is somewhat obese in no distress.  VITAL SIGNS:  Blood pressure was 120/64, the pulse 57 and regular, the  weight 189 pounds.  NECK:  Revealed no jugular venous distention.  LUNGS:  Clear bilaterally to auscultation. There were no wheezes, rales  or rhonchi.  CARDIOVASCULAR:  Regular rate and rhythm with normal S1 and S2.  ABDOMEN:  Protuberant, soft, nontender, nondistended. There is no  organomegaly.  EXTREMITIES:  Demonstrated trace peripheral edema bilaterally.   Interrogation of his defibrillator demonstrates a Medtronic Maximo with  R waves greater than 10, impedance of 408 ohms and a threshold a volt at  0.3. There were no intercurrent ICD therapies. The patient does have a  6949 lead which has been reprogrammed to minimize any ICD shocks.   IMPRESSION:  1. Ischemic cardiomyopathy.  2. Congestive heart failure.  3. Nonsustained ventricular tachycardia.   DISCUSSION:  Overall, Mr. Kissell is stable. He will  followup with Dr.  Mare Ferrari with regards to his congestive heart failure. His  defibrillator is working normally. Will plan to see him back in the ICD  clinic in one year and he will followup with __________ .     Champ Mungo. Lovena Le, MD  Electronically Signed    GWT/MedQ  DD: 03/11/2007  DT: 03/11/2007  Job #: [id number]   cc:   Darlin Coco, M.D.

## 2011-03-06 NOTE — H&P (Signed)
Gregory [name], Gregory [name] NO.:  [id number]   MEDICAL RECORD NO.:  [id number]          PATIENT TYPE:  EMS   LOCATION:  MAJO                         FACILITY:  [facility]   PHYSICIAN:  [name], MD DATE OF BIRTH:  08/04/1933   DATE OF ADMISSION:  11/25/2007  DATE OF DISCHARGE:                              HISTORY & PHYSICAL   PRIMARY CARE PHYSICIAN:  Darlin Coco, M.D.   CARDIOLOGIST:  Champ Mungo. Lovena Le, MD.   ELECTROPHYSIOLOGIST:  Burnard Bunting, MD.   ALLERGIES:  NO KNOWN DRUG ALLERGIES.   PRESENTING CIRCUMSTANCE:  It sure walloped me.   HISTORY OF PRESENT ILLNESS:  Gregory [name] is a 77 year old male.  He has a history of ischemic cardiomyopathy.  He had a myocardial  infarction in 2006.  This led to an urgent left heart catheterization.  Study showed that the LAD and the right coronary artery were  jeopardized.  The patient was in cardiogenic shock.  He needed an intra-  aortic balloon pump and then underwent emergent coronary artery bypass  graft surgery.  At catheterization, his ejection fraction was 20%.  He  had akinesis of the anterior wall and apex.  In the postoperative  period, he had atrial fibrillation and was placed on Coumadin which he  still takes today.  He also had a Medtronic Maximo [id number] cardioverter  defibrillator implanted for ischemic cardiomyopathy, nonsustained  ventricular tachycardia  at about the time of his bypass surgery.   Recently, he had a follow up 2-D echocardiogram at the office of Dr.  Mare Ferrari, ejection fraction was 25%. This patient seems very functional  with suspected New York Heart Association class II chronic systolic  congestive heart failure.   Today, the patient was up and about.  He felt fine.  At about 10 in the  morning, he was doing his morning ablutions.  He had just shaved and  then was brushing his teeth.  Suddenly was interrupted by ICD  discharges.  He felt 4 discharges.  He came to the  emergency room.  Interrogation registered 9 inappropriate discharges.  Electrical  parameters on interrogation implicate a fractured [id number] Medtronic right  ventricular lead.   MEDICATIONS:  1. Amiodarone 200 mg daily.  2. Altace 1.25 mg daily.  3. Furosemide 40 mg daily.  4. Lipitor 20 mg daily.  5. Synthroid 125 mcg daily.  6. Glimepiride 2 mg daily.  7. Coreg CR 20 mg daily.  8. Enteric-coated aspirin 325 mg daily.  9. Gabapentin 300 mg 2 tablets at bedtime.  10.Multivitamin daily.  11.Ferrous sulfate 65 mg daily.  12.Calcium carbonate 600 mg daily   PAST MEDICAL HISTORY:  1. New York Heart Association class II chronic systolic congestive      heart failure.  2. Diabetes greater than 10 years, non-insulin-dependence.  3. History of myocardial infarction, three-vessel coronary artery      disease at left heart catheterization with ejection fraction 20%,      subsequent coronary artery bypass graft surgery 2006 on IABP.  4. Cardioverter-defibrillator implant 2006.  The patient presented at  that time in acute pulmonary edema.  It was secondary to 100%      occlusion of the LAD.  He underwent emergent coronary artery bypass      graft surgery 2006.  5. Post-coronary artery bypass graft surgery, atrial fibrillation on      amiodarone.  6. At left heart catheterization 2006, ejection fraction 20%, akinesis      of anterior wall and apex.  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  8. Treated hypothyroidism.  9. Dyslipidemia.   SOCIAL HISTORY:  The patient lives in [place] with his wife of 64  years.  Their 1 daughter had juvenile diabetes and passed away at age  59.  The patient and his wife both instantly grieve at the  mention of  their daughter.  The patient used to smoke a pipe, but does not smoke  currently not for the last 3 years.  No alcoholic beverages.  No drugs.  He is a retired from [employer], corporate, he was a Freight forwarder.   FAMILY HISTORY:  Both mother and  father died of cancer.  Father died of  prostate cancer, but he has 2 sisters living, no coronary artery  disease.   REVIEW OF SYSTEMS:  No fevers, chills, no unexpected weight gain or  loss.  No epistaxis, vertigo or hoarseness.  No rashes, lesions or  nonhealing ulcerations.  No chest pain, no palpitations.  No presyncope.  No syncope.  No edema in the lower extremities.  No dyspnea on exertion.  No orthopnea, no hematuria, no dysuria.  No weakness, no numbness, no  anxiety or depression.  No neuro deficits.   PHYSICAL EXAMINATION:  GENERAL:  This is an alert and oriented male in  no acute distress.  VITAL SIGNS:  Temperature is 97.8, pulse is 60 and regular, blood  pressure 104/61, respirations are 20.  LUNGS:  Clear to auscultation bilaterally.  HEART:  Regular rate and rhythm without murmur.  ABDOMEN:  Soft, nondistended.  Bowel sounds are present.  EXTREMITIES:  Show no evidence of clubbing, edema or cyanosis.  NEURO:  Grossly intact.   DIAGNOSTICS:  Telemetry is sinus rhythm.   PLAN:  Admit the patient, cycle enzymes and get admission laboratories.  His ICD has been disabled so as not to provide more inappropriate  shocks.  The patient will have a probable lead revision in the morning  November 26, 2007.  The patient has low-level congestive heart failure symptoms, but with a  right bundle branch block and increased PR interval, the patient may be  a candidate for BIV/ICD upgrade as well as the expected right  ventricular lead replacement.  Removal of the existing fractured right  ventricular lead is per Dr. Lovena Le will be doing the procedure.      Sueanne Margarita, PA    ______________________________  [name], MD    GM/MEDQ  D:  11/25/2007  T:  11/26/2007  Job:  [id number]

## 2011-03-06 NOTE — Assessment & Plan Note (Signed)
[place] OFFICE NOTE   Gregory [name], Gregory [name]                     MRN:          [id number]  DATE:05/20/2007                            DOB:          Aug 31, 1933    REFERRING PHYSICIAN:  Burnard Bunting, M.D.   REASON FOR CONSULTATION:  Minor intermittent rectal bleeding and  colonoscopy.   HISTORY:  This is a pleasant, 77 year old white male with a history of  coronary artery disease, status post myocardial infarction, status post  coronary artery bypass grafting in February of 2006, history of  congestive heart failure, status post pacemaker placement, status post  internal defibrillator placement, history of diabetes mellitus, and a  history of depression.  He is referred today through the courtesy of Dr.  Reynaldo Minium regarding intermittent rectal bleeding and the need for  colonoscopy.  Patient reports to me that he sees bright red blood  intermittently in the toilet, as well as the toilet paper.  This has  occurred intermittently over the past one and a half to two years.  It  may happen as infrequently as once every three months.  There is no  obvious rectal pain or rectal discomfort associated with the bleeding.  He has no abdominal pain or change in bowel habits, though his bowels  tend to be a bit constipated and he needs to strain for defecation.  He  has not had prior colonoscopy.  From a cardiovascular standpoint, he has  been stable.   PAST MEDICAL HISTORY:  As above.   PAST SURGICAL HISTORY:  1. Status post tonsillectomy.  2. Status post coronary artery bypass grafting.  3. Status post cholecystectomy.  4. Status post right-eye surgery (blindness).   ALLERGIES:  No known drug allergies.   CURRENT MEDICATIONS:  1. Aspirin 325 mg daily.  2. Furosemide 40 mg daily.  3. Altace 1.25 mg daily.  4. Glimepiride 2 mg daily.  5. Lipitor 20 mg daily.  6. Neurontin 300 mg in the morning and 600 mg  at night.  7. Amiodarone 100 mg daily.  8. Iron 65 mg at night.  9. Coreg 20 mg daily.  10.Lovaza two b.i.d.  11.Doxycycline.  12.Synthroid 125 micrograms.   FAMILY HISTORY:  No family history of gastrointestinal malignancy.   SOCIAL HISTORY:  Patient is married, accompanied by his wife.  They had  one daughter, who is deceased.  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  He quit smoking in 2006, after his heart attack.  Does not  use alcohol.   REVIEW OF SYSTEMS:  Per diagnostic evaluation form.   PHYSICAL EXAM:  Well-appearing male, in no acute distress.  Blood pressure is 118/54, heart rate is 60 and regular, his weight is  190 pounds, he is 5 feet 7 inches in height.  HEENT:  Sclerae are anicteric.  Conjunctivae are pink.  Oral mucosa is  intact, no adenopathy.  His right eye has a patch on it.  LUNGS:  Clear.  HEART:  Regular.  ABDOMEN:  Soft, without tenderness, mass or hernia.   IMPRESSION:  This is a 77 year old gentleman with  multiple significant  medical problems for which he is currently clinically stable.  He  presents today regarding intermittent minor rectal bleeding.  This is  most likely due to benign anorectal pathology.  More worrisome causes  should be excluded.   RECOMMENDATIONS:  Colonoscopy with polypectomy, if necessary.  The  nature of the procedure, as well as the risks, benefits, and  alternatives, have been reviewed.  He understood and agreed to proceed.  We discussed issues regarding his defibrillator and preparation options,  given his medical problems.     Docia Chuck. Henrene Pastor, MD  Electronically Signed    JNP/MedQ  DD: 05/20/2007  DT: 05/21/2007  Job #: [id number]

## 2011-03-06 NOTE — Op Note (Signed)
Gregory [name], Gregory [name] NO.:  [id number]   MEDICAL RECORD NO.:  [id number]          PATIENT TYPE:  INP   LOCATION:  2034                         FACILITY:  [facility]   PHYSICIAN:  Champ Mungo. Lovena Le, MD    DATE OF BIRTH:  08-19-1933   DATE OF PROCEDURE:  11/26/2007  DATE OF DISCHARGE:                               OPERATIVE REPORT   PROCEDURE PERFORMED:  Implantation of a new single coil defibrillator  lead and ICD pocket revision with defibrillation threshold testing.   INTRODUCTION:  The patient is 14-year male with a longstanding ischemic  cardiomyopathy and class II heart failure who presented to hospital  after having nine ICD shocks and was essentially found to have ICD lead  failure, resulting in noise being sensed as VF resulting in the  unnecessary shocks.  He is now referred for insertion of new  defibrillator lead and ICD pocket revision.   PROCEDURE:  After informed obtained the patient is taken to the  diagnostic EP lab in fasting state.  After usual preparation and  draping, 10 mL of contrast was injected into the left upper extremity  venous system demonstrating a patent left subclavian vein as documented  by the venography.  The left subclavian vein was then punctured and the  Medtronic model Sprint Quattro secure S, model [id number] 65-cm active  fixation single coil defibrillation was advanced by way of the  subclavian vein into the right ventricle.  The final site on the RV  septum the R-waves measured 11 mV and the pacing impedance was 723 ohms  with the lead actively fixed.  10 volts pacing did not stimulate the  diaphragm, the pacing threshold 0.7 volts at 0.5 milliseconds.  With the  defibrillation lead in satisfactory position it was secured to  subpectoralis fascia with a figure-of-eight silk suture and the sewing  sleeve was also secured with silk suture.  At this point electrocautery  was utilized to enter the subcutaneous pocket and remove the  previously  implanted Medtronic Maximo defibrillator.  The defibrillator was placed  in a bowl of kanamycin.  The electrocautery was then utilized to revise  the previously made subcutaneous pocket.  Having accomplished this, the  pocket was irrigated with kanamycin.  Electrocautery utilized to assure  hemostasis.  The Medtronic Maximo VR was connected to the new  defibrillator lead and placed back in the subcutaneous pocket.  Generator secured with silk suture.  Kanamycin was utilized to irrigate  the pocket and defibrillation threshold testing carried out.   After the patient was more deeply sedated with fentanyl and Versed, VF  was induced with T-wave shock and a 15 joules shock was delivered which  terminated VF and restored sinus rhythm.  At this point no additional  defibrillation threshold test was carried out and the incision was  closed with 2-0 Vicryl followed by layer of 3-0 Vicryl.  Benzoin painted  on skin Steri-Strips applied and pressure dressings placed.  The patient  was returned to his room in satisfactory condition.   COMPLICATIONS:  There were no immediate procedure complications.   RESULTS:  This demonstrates successful implantation of a Medtronic ICD  lead in a patient whose previous lead had failed.  Defibrillation  threshold testing demonstrated DFT at 15 joules.      Champ Mungo. Lovena Le, MD  Electronically Signed     GWT/MEDQ  D:  11/26/2007  T:  11/27/2007  Job:  [id number]   cc:   Darlin Coco, M.D.

## 2011-03-09 ENCOUNTER — Encounter: Payer: Self-pay | Admitting: Internal Medicine

## 2011-03-09 ENCOUNTER — Ambulatory Visit (INDEPENDENT_AMBULATORY_CARE_PROVIDER_SITE_OTHER): Payer: Medicare Other | Admitting: Internal Medicine

## 2011-03-09 DIAGNOSIS — I255 Ischemic cardiomyopathy: Secondary | ICD-10-CM

## 2011-03-09 DIAGNOSIS — I509 Heart failure, unspecified: Secondary | ICD-10-CM

## 2011-03-09 DIAGNOSIS — I2589 Other forms of chronic ischemic heart disease: Secondary | ICD-10-CM

## 2011-03-09 DIAGNOSIS — Z95810 Presence of automatic (implantable) cardiac defibrillator: Secondary | ICD-10-CM

## 2011-03-09 DIAGNOSIS — I428 Other cardiomyopathies: Secondary | ICD-10-CM

## 2011-03-09 NOTE — H&P (Signed)
[facility]  Patient:    Gregory [name], Gregory [name]                     MRN: [id number] Adm. Date:  [id number] Attending:  Nicholaus Bloom CC:         Richard A. Reynaldo Minium, M.D.  [name], M.D.   History and Physical  FOLLOW-UP EVALUATION:  [name] comes in for follow-up evaluation of his chronic neck and shoulder pain. Since his last evaluation, we have tapered him nearly off of his OxyContin and placed him on apple cider vinegar with honey. He has noted a marked improvement with regard to his pain. He did take some Vioxx at one point and noted that this helped him to a degree. The combination of apple cider vinegar and honey seems to have helped the most.  PHYSICAL EXAMINATION:  VITAL SIGNS:  Blood pressure is 138/73, heart rate is 105, respiratory rate is 18, O2 saturation is 98%, pain level is 3/10.  NEUROLOGICAL:  Neck shows no change in range of motion. Right shoulder is unchanged with regard to limitations in range of motion. Deep tendon reflexes were symmetric in the upper and lower extremities.  IMPRESSION: 1. Right shoulder pain most likely on the basis of rotator cuff tendonitis. 2. Cervical spondylosis, stable. 3. Other medical problems per Richard A. Reynaldo Minium, M.D.  DISPOSITION: 1. Go off OxyContin. 2. Continue with apple cider vinegar/honey combination. 3. He was given some Vioxx 25 mg one p.o. q.d., #30 with two refills if needed    in the event the apple cider vinegar with honey was less efficacious. DD:  11/28/00 TD:  11/29/00 Job: [id number]

## 2011-03-09 NOTE — Op Note (Signed)
NAMECAELLUM, STORK              ACCOUNT NO.:  [id number]   MEDICAL RECORD NO.:  [id number]          PATIENT TYPE:  AMB   LOCATION:  SDS                          FACILITY:  [facility]   PHYSICIAN:  Shellia Carwin, M.D. DATE OF BIRTH:  Aug 03, 1933   DATE OF PROCEDURE:  02/18/2006  DATE OF DISCHARGE:                                 OPERATIVE REPORT   OPERATIVE PROCEDURE:  Laparoscopic cholecystectomy with intraoperative  cholangiogram.   SURGEON:  Shellia Carwin, M.D.   ASSISTANT:  Odis Hollingshead, M.D.   ANESTHESIA:  General endotracheal.   PREOPERATIVE DIAGNOSES:  Gallstones, cholecystitis.   POSTOPERATIVE DIAGNOSES:  Gallstones, cholecystitis, normal cholangiogram.   CLINICAL SUMMARY:  A 77 year old male with multiple medical problems,  including diabetes, cardiac disease, and hypertension, who is brought in  with gallstones for elective cholecystectomy.   OPERATIVE FINDINGS:  The gallbladder was thin-walled, not acutely inflamed.  There were adhesions of it to the omentum, etcetera.  Then, the  cholangiogram was normal in anatomy.   DESCRIPTION OF PROCEDURE:  Under satisfactory general endotracheal  anesthesia, the patient was positioned, and prepped and draped in a standard  fashion.  He received 1 gram of Ancef preop.  A total of 26 mL of 0.5%  Marcaine with epinephrine was infiltrated for postop analgesia at the  operative sites.   A transverse incision was made above the umbilicus, midline identified and  opened, peritoneum entered, controlled with a figure-of-eight 0 Vicryl  suture and operating Hassan, ports inserted and secured, good CO2  pneumoperitoneum established and camera placed.  With excellent  visualization, two #5 ports were placed laterally and a second #10 medially.  A lateral grasper was placed and operating through the medial port, using  coagulating scissors, I took down sharply the adhesions of omentum to the  liver and gallbladder.  Then, the  dissector was used to carefully dissect  the cystic duct and artery.  The patient had a very large hepatic artery,  close to the cystic artery, and we carefully avoided that.  Multiple clips  were placed on the cystic artery, and it was cupped.  A single clip on the  cystic duct near the gallbladder.  A percutaneous catheter placed and good  cholangiogram obtained that looked normal.  The catheter was removed and the  cystic duct was controlled with multiple clips and divided.  The gallbladder  was then removed from the liver bed from below upward with coagulating  current for hemostasis and dissection.  A small hole was made in the  gallbladder and spillage of some clear bile.  The gallbladder was removed  from the liver bed and then placed in an EndoCatch bag.   The liver bed was checked for hemostasis and made dry by cautery.  Because  of concern for some postop bleeding when he returns to his anticoagulation,  Surgicel was also placed.  Hemostasis was very good.   Then, the camera moved to the upper port and a large grasper placed at the  umbilicus and the gallbladder in the bag, removed.  The operative site was  then checked.  The abdomen was lavaged with saline, suctioned dry, ports and  CO2 released.  The midline was closed with the previous figure-of-eight and  a second interrupted 0 Vicryl suture, followed by a 4-0 Vicryl subcu.  Then,  the upper medial site was checked and one 0 Vicryl suture was used to  approximate the fascia.  Again, subcu was 4-0 Vicryl, and Steri-Strips  applied to all incisions.  Sterile absorbent dressings were applied and the  patient went to the recovery room from the operating room in good condition.  Sponge and needle counts were correct.  No complications.           ______________________________  Shellia Carwin, M.D.     MRL/MEDQ  D:  02/18/2006  T:  02/18/2006  Job:  [id number]   cc:   Darlin Coco, M.D.  Fax: [phone]   Burnard Bunting, M.D.  Fax: [phone]

## 2011-03-09 NOTE — Cardiovascular Report (Signed)
NAMEHANNA, COMINS NO.:  [id number]   MEDICAL RECORD NO.:  [id number]          PATIENT TYPE:  INP   LOCATION:  2929                         FACILITY:  [facility]   PHYSICIAN:  Belva Crome III, M.D.DATE OF BIRTH:  [birth year]   DATE OF PROCEDURE:  12/02/2004  DATE OF DISCHARGE:                              CARDIAC CATHETERIZATION   PROCEDURE PERFORMED:  Insertion of intra-aortic balloon pump.   INDICATIONS FOR PROCEDURE:  Acute heart failure in this patient with severe  3-vessel coronary disease including total occlusion of the proximal RCA,  proximal circumflex and high-grade diffuse LAD disease with TIMI grade 2  flow documented by catheterization on 11/30/2004.  This evening he suddenly  developed shortness of breath, EKG demonstrates ST elevation in anterior  leads.  He is felt, not to be an emergency surgical candidate, after  discussion with Dr. Gilford Raid.  I felt that this procedure was indicated  to help improve myocardial perfusion and treat heart failure and impending  cardiogenic shock.   DESCRIPTION:  The patient was brought to the catheterization laboratory  under emergency circumstances where the right iliofemoral region was  sterilely prepped and draped.  We then placed a 6-French, intra-aortic 40  cm, intra-aortic balloon pump.  This was done under fluoroscopic guidance  without difficulty.  1:1 punting was then begun.  The patient was taken back  to the cardiac care unit in improved condition.   CONCLUSIONS:  Successful placement of intra-aortic balloon pump for the  above indication.   PLAN:  IV diuresis, IV nitroglycerin. Prognosis is guarded.  He does not  have PTI options that are reasonable. He is not deemed to be a reasonable  candidate for emergency surgery at this time either.      HWS/MEDQ  D:  12/02/2004  T:  12/03/2004  Job:  [id number]   cc:   Darlin Coco, M.D.  [id number] [address] [place] [address]  Fax:  [phone]   Thayer Headings, M.D.  [id number] [address] [place] [address]  Fax: [place], [place]  [address]  [place]  [place] [address]   Burnard Bunting, M.D.  [address]  [place]  [place] [address]  Fax: [phone]   Youlanda Roys. Deatra Ina, M.D.  [address]  [place]  [place] [address]  Fax: [phone]

## 2011-03-09 NOTE — Assessment & Plan Note (Signed)
He denies anginal symptoms. He will continue his current medications. 

## 2011-03-09 NOTE — Op Note (Signed)
NAMESHIRAZ, DORSON              ACCOUNT NO.:  [id number]   MEDICAL RECORD NO.:  [id number]          PATIENT TYPE:  INP   LOCATION:  2033                         FACILITY:  [facility]   PHYSICIAN:  Champ Mungo. Lovena Le, M.D.  DATE OF BIRTH:  [birth year]   DATE OF PROCEDURE:  05/11/2005  DATE OF DISCHARGE:                                 OPERATIVE REPORT   PROCEDURE PERFORMED:  Implantation of an implantable  cardioverter/defibrillator (ICD).   INDICATIONS:  Ischemic cardiomyopathy, status post MI.  Status post bypass  surgery with an ejection fraction of 30%, class 2 heart failure.   INTRODUCTION:  The patient is a 77 year old man status post myocardial  infarction back in February.  He underwent three-vessel bypass surgery at  that time.  Postoperatively, his ejection fraction has been 30%.  He has  class 2 heart failure.  His most recent in May demonstrated an ejection  fraction of 15%.  He is now referred for prophylactic ICD implantation.  The  patient has a history of nonsustained. VT.   PROCEDURE:  After informed consent was obtained, the patient was taken to  the diagnostic EP lab in a fasting state.  After the usual prepping and  draping __________ midazolam was given for sedation, 30 cc of lidocaine was  infiltrated into the left infraclavicular region.  A 9 cm incision was  carried out over this region.  Electrocautery was utilized to dissect down  to the fascial plane.  Then 10 cc of contrast was injected into the left  upper extremity venous system, demonstrating a patent left subclavian vein.  It subsequently punctured, and the Medtronic model 6949 65 cm axial fixation  defibrillator lead, serial [id number] was advanced into the right  ventricle.  Mapping was carried out in the right ventricle.  At the final  site, the R wave was measured at 11 mV.  The pacing impedance was 824 ohm,  and the pacing threshold was 1.5 volts at 0.5 milliseconds.  The 10 volt  pacing in this  location did not stimulate the diaphragm.  With the  defibrillator lead in satisfactory position, it was carried to the  subpectoralis fascia with a figure-of-eight silk suture.  The sew-in sleeve  was also secured with silk suture.  Electrocautery was utilized to make a  subcutaneous pocket.  Kanamycin irrigation was utilized to irrigate the  pocket, and electrocautery utilized to assure hemostasis.  The Medtronic  model 7232 single-chamber defibrillator, serial [id number] was connected to  the defibrillator lead and placed in the subcutaneous pocket.  The generator  was secured with a silk suture.  Additional kanamycin was utilized to  irrigate the pocket and defibrillator threshold testing carried out.  After  the patient was more deeply sedated with fentanyl and Versed, VF was induced  with a T-wave shock.  A 15 joule shock was delivered, which terminated  ventricular fibrillation and restored sinus rhythm.  Five minutes was  allowed to lapse, and a second defibrillation threshold testing carried out.  Again, VF was induced with a T wave shock, and again, a 15 joule shock was  delivered, which terminated ventricular fibrillation and restored sinus  rhythm.  At this point, no additional defibrillation threshold testing was  carried out.  The incision was closed with a layer of 2-0 Vicryl, followed  by a layer of 3-0 Vicryl, followed by a layer of 4-0 Vicryl.  Benzoin was  painted on the skin.  Steri-Strips were applied.  A pressure dressing was  placed.  The patient was returned to his room in satisfactory condition.   COMPLICATIONS:  There were no immediate procedural complications.   RESULTS:  This demonstrates successful implantation of a Medtronic single-  chamber defibrillator in a patient with an ischemic cardiomyopathy, class 2  heart failure, status post myocardial infarction with a history of  nonsustained ventricular tachycardia.       GWT/MEDQ  D:  05/11/2005  T:   05/11/2005  Job:  [id number]   cc:   Darlin Coco, M.D.  [id number] [address], [address] [place]  [place] [address]  Fax: [phone]

## 2011-03-09 NOTE — Consult Note (Signed)
[facility]  Patient:    Gregory [name], Gregory [name]                     MRN: [id number] Proc. Date: 10/03/00 Adm. Date:  [id number] Attending:  Nicholaus Bloom CC:         Richard A. Reynaldo Minium, M.D.  [name], M.D.   Consultation Report  FOLLOW-UP EVALUATION  Mr. Corporon comes in for follow-up evaluation of his cervical spondylosis and right shoulder pain.  Since his last evaluation, he has seen Dr. Reynaldo Minium who started him on Synthroid.  He continues to have predominantly just the right shoulder pain which he rates at a level of 4/10.  He notes that lifting heavy objects will increase his right shoulder discomfort; rest decreases it.  He is having a considerable amount of problems with constipation, but it sounds as though he has been kind of hit and miss with how he takes the laxatives.  This also may be partially related to his underlying hypothyroidism.  He complains of a lot of dry skin.  He is on OxyContin 20 mg twice a day.  In addition, he takes Vioxx, glucosamine, Centrum, Tums, extra-strength Tylenol, and Glucotrol.  The Vioxx also could be constipating him.  PHYSICAL EXAMINATION:  Blood pressure is 143/76.  Heart rate is 110, respiratory rates 18, O2 saturations 98%.  Pain level is 4/10.  He exhibits cold hands and feet with good pulses at the radial and dorsalis pedis.  Deep tendon reflexes are symmetric.  He continues to have fairly limited range of motion of his right shoulder.  IMPRESSION: 1. Right shoulder pain most likely on the basis of rotator cuff tendonitis. 2. Cervical spondylosis - stable. 3. Constipation, possibly related either the OxyContin, Vioxx, or to his    hypothyroidism. 4. Other medical problems per Dr. Reynaldo Minium.  DISPOSITION: 1. Continue on OxyContin 20 mg 1 p.o. b.i.d. #60 with no refill. 2. He was given the Senokot protocol from [facility]. 3. Follow up with me in eight weeks. 4. He was encouraged to call if he  does not get results with his protocol for    his constipation. DD:  10/03/00 TD:  10/03/00 Job: [id number]

## 2011-03-09 NOTE — Discharge Summary (Signed)
Gregory [name], Gregory [name]              ACCOUNT NO.:  [id number]   MEDICAL RECORD NO.:  [id number]          PATIENT TYPE:  INP   LOCATION:  2036                         FACILITY:  [facility]   PHYSICIAN:  Gilford Raid, M.D.     DATE OF BIRTH:  12-Nov-1932   DATE OF ADMISSION:  11/29/2004  DATE OF DISCHARGE:  12/11/2004                                 DISCHARGE SUMMARY   ADMITTING DIAGNOSES:  1.  Chest pain with ST elevation and myocardial infarction.   DISCHARGE/SECONDARY DIAGNOSES:  1.  Severe three vessel coronary artery disease, severe left ventricular      dysfunction status post acute myocardial infarction.  Status post      emergency coronary artery bypass grafting.  2.  Postoperative renal insufficiency, improving.  3.  Diabetes mellitus Type 2, poorly controlled.  4.  Hypothyroidism.  5.  Diabetic neuropathy on Neurontin.  6.  Hypercholesterolemia.  7.  Hypertension.  8.  Right bundle branch block, uncertain age.  9.  Ongoing pipe smoker.  10. 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  11. History of right shoulder surgery.  12. No known drug allergies.  13. Postoperative atrial fibrillation, resolved on amiodarone.  14. Acute pulmonary edema secondary to coronary artery disease and suspected      occlusion of his left anterior descending artery requiring emergent      intra-aortic balloon pump placement followed by emergent coronary artery      bypass graft.   PROCEDURES:  1.  December 02, 2004:  Emergency median sternotomy for coronary artery      bypass grafting x4 using a left internal mammary artery to a left      anterior descending artery, saphenous vein graft to the second diagonal      branch of the left anterior descending, saphenous vein graft to the      obtuse marginal branch of the left circumflex coronary artery, saphenous      vein graft to the distal right coronary artery, endoscopic vein      harvesting from the right leg.  Surgeon Dr. Gilford Raid.  2.   December 02, 2004:  Intraoperative transesophageal echocardiogram by Dr.      Lillia Abed.  3.  December 02, 2004:  Cardiac catheterization for insertion of intra-      aortic balloon pump by Dr. Daneen Schick, III.  4.  November 30, 2004:  Cardiac catheterization by Dr. Mertie Moores showing      severe three vessel coronary artery disease with mild to moderate mitral      regurgitation (1+ per transesophageal echocardiogram).  There was      moderate to severe left ventricular dysfunction with an ejection      fraction of 20% with akinesis/dyskinesis of anterior wall and apex.      There is severe hypokinesis of inferior wall.  There was 1-2+ mitral      regurgitation.  5.  November 30, 2004:  Carotid duplex showed bilateral mild diffuse carotid      artery disease, but no significant internal carotid artery stenosis.  Vertebral artery flow was antegrade.  6.  Ankle brachial indexes are greater than 1.0 bilaterally with normal wave      forms.   BRIEF HISTORY:  Gregory [name] is a 77 year old Caucasian male who on November 29, 2004 presented to [facility] Emergency Department with chest pain.  He  did not have any prior history of known coronary artery disease.  Apparently  around 2 p.m. that day while watching television he developed substernal  tightness not long after lunch.  He felt it might be due to indigestion but  it persisted, lasted about 45 minutes.  At that point he and his wife drove  to the emergency department.  He did take two aspirin at home prior to  leaving.  Described the pain as a dull discomfort in the center of his chest  which did not radiate to his jaw or arm.  There was no associated nausea,  vomiting, or diaphoresis.  His medical history is significant for diabetes  mellitus, hypercholesterolemia, and hypertension.  EKG in the emergency  department showed normal sinus rhythm with deep Q-waves in leads 2, 3, and  aVF which appear old and were felt consistent with  an old inferior wall  myocardial infarction.  He also had a right bundle branch block.  There was  no acute ST segment changes.  However, his enzymes were positive with a CK-  MB of 45, 13.3, and 37.9.  Troponin were 0.19-0.99 and 5.59.  Gregory [name] was  free of chest pain on arrival to the emergency department; however, it was  felt that he was suffering from myocardial infarction and was admitted for  further treatment including telemetry, IV heparin, nitroglycerin, and  aspirin with plans for future cardiac catheterization during this  hospitalization.   HOSPITAL COURSE:  On November 29, 2004 Gregory [name] is admitted to [facility] after suffering a myocardial infarction.  He was treated with IV  heparin and Integrilin and placed on a telemetry floor.  He was continued on  aspirin.  On February 9 he underwent cardiac catheterization by Dr. Acie Fredrickson  with results discussed briefly above.  Dr. Lanelle Bal of [facility] was  consulted regarding consideration of coronary artery bypass grafting this  hospitalization.  Dr. Servando Snare saw Gregory [name] on February 9 and did feel  that the patient would benefit from cardiac bypass surgery.  However, it was  felt that first an echocardiogram should be done to evaluate the mitral  regurgitation that was noted on cardiac catheterization report.  In  addition, it was felt that Gregory [name] should get better control of his  diabetes prior to surgery as his admission hemoglobin A1C was elevated at  8.7.  Once these issues were addressed, Dr. Servando Snare felt the patient should  be ready for surgery around February 14.  After discussing risks, benefits,  and alternatives with the patient he agreed to proceed.  However, just two  days later the patient developed acute pulmonary edema that awakened him  from sleep.  He was transferred to the critical care unit under the care of Dr. Pernell Dupre.  Dr. Tamala Julian felt the acute pulmonary edema was secondary to a  severe  coronary artery disease with suspected occlusion of his LAD.  Initial  plan was insertion of an intra-aortic balloon pump with emergent contact  with cardiac surgery.  Dr. Gilford Raid was the covering cardiac surgeon  that day.  After discussing the plan with Dr. Cyndia Bent, he felt  Mr. Wilkins  should first undergo placement of an intra-aortic balloon pump and then try  to stabilize the patient and resolve his pulmonary edema prior to surgery.  This was done on the 11th and he was transferred to the critical care unit  where he was medically stabilized.  His oxygenation improved and he was  weaned from 100% FiO2 down to 4 L by nasal cannula.  His oxygen saturation  remained at 98% if he showed good evidence of diuresing well.  He remained  hemodynamically stable and denied chest pain and resolution of his shortness  of breath.  Once stabilized Dr. Cyndia Bent felt that he should proceed with  emergent coronary artery bypass grafting surgery as follow-up cardiac  enzymes did show an increase that morning with a CPK now at 1544 and an MB  of 80.  He also showed new in 2 ST elevation per EKG suggesting there was  further ischemia.  Again, risks and benefits were discussed with the patient  and he agreed to proceed and was taken to the operating room emergently.   On postoperative day one Mr. Fansler remained sedated on the ventilator.  Chest x-ray showed continued pulmonary edema.  EKG showed normal sinus  rhythm with a right bundle branch block.  His ST changes had improved.  He  remained hemodynamically stable on a dopamine drip.  The intra-aortic  balloon pump remained in place.  Diuresis was continued for his pulmonary  edema using furosemide drip.   On postoperative day two Mr. Gruett had been extubated neurologically  intact.  Chest x-ray showed improving edema.  He remained on dopamine and  Levophed drip.  Otherwise, he was stable and slightly tachycardic in the low  100s.  His blood sugars  were elevated and did initially require insulin drip  but post extubation he was switched to Lantus insulin as well as sliding  scale.  His Glucophage was placed on hold due to his poor left ventricular  function and congestive heart failure.  On postoperative day three Mr.  Dicroce remained stable, although he did develop atrial fibrillation which  required IV amiodarone.  He was back in sinus rhythm shortly thereafter.  He  continued to show decrease in his weight, although was still up around 10  pounds from his baseline.  His creatinine did show some elevation to 1.4.  His baseline was 1.1.  Otherwise, he was felt to be progressing well and was  felt stable for transfer out of the intensive care unit and out to the  floor.   Once on unit 2000 Mr. Cudd made steady progression.  He did initially have  some nausea with amiodarone, but improved when switching to oral regimen. He continued to show good diuresis and resolution of his edema and  eventually his diuretic therapy was discontinued.  His creatinine did peak  at 2.0, but began decreasing to 1.8 and remained stable.  Due to his renal  insufficiency his Glucophage was held.  Instead, he was started on low dose  Amaryl with Lantus insulin.  His Lantus insulin was eventually discontinued  due to a blood sugar of 68.  He was continued on Amaryl as well as sliding  scale.  He also developed some mild hypotension which required decreasing  his beta blocker and amiodarone.  After these adjustments his blood pressure  seemed to tolerate low dose metoprolol 12.5 mg b.i.d. and amiodarone 200 mg  b.i.d.  He maintained sinus rhythm.   By December 06, 2004 Mr. Duhn chest x-ray showed near complete  resolution of his pulmonary edema with basilar atelectasis/small bilateral  pleural effusions.  His chest tubes had been removed and there was no  evidence of pneumothorax.  He continued to feel that his breathing was  improving and denied  shortness of breath.  He did develop a productive cough  with congestion which was treated with Humibid.  He did remain afebrile with  a decreasing white blood count of 10.5.  By postoperative day eight Mr.  Tomlins was felt near ready for discharge home.  He remained hemodynamically  stable and was tolerating his blood pressure regimen.  His systolic blood  pressure readings ranged between low [id number] with diastolic readings in the  60s and 70s.  He was maintaining sinus rhythm in the 80s.  He was afebrile  and saturating 95% on room air.  His creatinine had stayed stable for  several days at 1.8.  His blood sugars were moderately controlled on Amaryl  primarily ranging in the 120s-170s.  His weight was just above his baseline  of 178.  He felt that his chest congestion was improving.  On examination  his heart had a regular rate and rhythm.  His lungs were diminished at the  bases, but otherwise clear.  His abdominal examination was benign and he was  tolerating oral diet.  His extremities showed only trace lower extremity and  his incisions were healing well without signs of infection.  His pain was  managed on oral medication and his bowel and bladder were functioning  appropriately.  He was also ambulating in the hallways with both nursing and  cardiac rehabilitation assist.  His distance was improving daily.  It was  noted that he was ambulating often with the rolling walker.  This will be  arranged as is felt needed at discharge.   It is anticipated that Mr. Schneller will be ready for discharge home on  postoperative day 9, December 11, 2004 if there were no significant changes  in his status and if his renal and pulmonary function remained stable.   LABORATORIES:  February 19:  Sodium 135, potassium 3.9, chloride 102, CO2  27, blood glucose 97, BUN 28, creatinine 1.8, calcium 7.9.  Other recent laboratories showed a white blood count 10.5, hemoglobin 10.6, hematocrit  30.8, platelet  count 287.  Hemoglobin A1C 8.7.  Magnesium 2.3.  Total  bilirubin on February 11 was 2.0 with alkaline phosphatase of 97, SGOT 115,  SGPT 30, total protein 6.9, blood albumin 3.4.  Lipid profile showed a total  cholesterol of 166, triglyceride 362, HDL 31, LDL 63.  TSH 5.408, T4 6.7.   DISCHARGE MEDICATIONS:  1.  Enteric-coated aspirin 325 mg one p.o. daily.  2.  Toprol XL 25 mg one p.o. daily.  3.  Lipitor 20 mg one p.o. daily.  4.  Amiodarone 200 mg one p.o. b.i.d.  5.  Synthroid 50 mcg one p.o. daily.  6.  Amaryl 2 mg one p.o. daily.  7.  Neurontin 300 mg each morning, 600 mg at nighttime.  8.  Tylox one to two tablets p.o. q.4h. p.r.n. moderate to severe pain or      Tylenol 325 mg one to two tablets q.4h. p.r.n. mild pain.   DISCHARGE INSTRUCTIONS:  He is instructed to avoid driving or heavy lifting  more than 10 pounds.  He was encouraged to continue daily breathing and  walking exercises.  He is to follow a carbohydrate-modified  diet.  He may  shower and clean his incisions gently with mild soap and water.  He should  notify the [facility] office if he develops fever, increasing shortness of breath,  redness or drainage from his incision site, or increasing weight or edema.   FOLLOW-UP:  He is to follow up with Dr. Cyndia Bent at the [place] office in  approximately three weeks from discharge.  The [facility] office will contact him  with a specific appointment date and time.  He is to call [phone] to  schedule a two-week follow-up with Dr. Mare Ferrari with a chest x-ray.  He is  instructed to bring this chest x-ray with him to his follow-up appointment  with Dr. Cyndia Bent.  He should schedule one to two-week follow-up with Dr.  Burnard Bunting to readdress his diabetes and current glucose reading.      AWZ/MEDQ  D:  12/10/2004  T:  12/11/2004  Job:  [id number]   cc:   Darlin Coco, M.D.  [id number] [place] [address]  Fax: [phone]   Thayer Headings, M.D.  [id number]  [address], [place]  [place] [address]  Fax: [place]. Deatra Ina, M.D.  [address]  [place]  [place] [address]  Fax: [phone]   Burnard Bunting, M.D.  [address]  [place]  [place] [address]  Fax: [phone]

## 2011-03-09 NOTE — Cardiovascular Report (Signed)
[name], [name] NO.:  [id number]   MEDICAL RECORD NO.:  [id number]          PATIENT TYPE:  INP   LOCATION:  2037                         FACILITY:  [facility]   PHYSICIAN:  Thayer Headings, M.D. DATE OF BIRTH:  April 06, 1933   DATE OF PROCEDURE:  11/30/2004  DATE OF DISCHARGE:                              CARDIAC CATHETERIZATION   Gregory [name] is a 77 year old gentleman with a history of diabetes mellitus.  He was admitted to the hospital last night with subendocardial myocardial  infarction (anterior wall). He was noted to have Q-waves in the inferior  lateral leads at the time of admission. The patient became pain free with  medical therapy and is now referred for heart catheterization.   The procedure was left heart catheterization with coronary angiography.  Right femoral artery was easily cannulated using modified Seldinger  technique.   HEMODYNAMIC RESULTS:  LV pressure was 92/24 with an aortic pressure of  91/55.   Angiography:  Left main, left main coronary artery has mild luminal irregularities.   The left anterior descending artery is subtotally occluded in the proximal  segment. There is sluggish TIMI-1 flow down the down the distal LAD. The  proximal and mid LAD has the appearance of a string of beads with multiple  subtotal occlusions. They are two small diagonal vessels which have only  minor luminal irregularities.   The left circumflex artery is occluded proximally. The first obtuse marginal  artery fills very slowly via collaterals.   The right coronary artery is occluded proximally. The mid and distal vessel  filled very slowly through collateral vessels.   The left subclavian artery and left internal mammary artery were widely  patent with brisk flow.   The left ventriculogram reveals moderate left ventricular enlargement. There  is moderate to severe left ventricular dysfunction with an ejection fraction  of 20%. There is  akinesis/dyskinesis of the anterior wall and apex. There is  severe hypokinesis of the inferior wall. The anterior base of the left  ventricle contracts fairly normally. There is one to two plus mitral  regurgitation.   COMPLICATIONS:  None.   CONCLUSION:  Severe three-vessel coronary artery disease with at least mild  to moderate mitral regurgitation. The patient will need coronary artery  bypass grafting and may need a mitral valve angioplasty ring.  I have  consulted [facility]. At their request, we will get an echocardiogram for further  evaluation of his mitral regurgitation.      PJN/MEDQ  D:  11/30/2004  T:  11/30/2004  Job:  [id number]   cc:   Burnard Bunting, M.D.  [address]  [place]  [place] [address]  Fax: [phone]   [facility] office

## 2011-03-09 NOTE — Consult Note (Signed)
[facility]  Patient:    Gregory [name], Gregory [name]                     MRN: [id number] Proc. Date: 08/08/00 Adm. Date:  [id number] Attending:  Nicholaus Bloom CC:         [name], M.D.   Consultation Report  FOLLOW-UP EVALUATION  Gregory [name] comes in for a follow-up evaluation of his right shoulder pain and cervical spondylosis. He states that he has been relatively stable on his current dose except for some right knee discomfort. He rates his pain at about 3-4/10. He is tolerating the OxyContin 20 mg twice a day well. He continues on Vioxx, glucosamine, Centrum, Tums, extra strength Tylenol and Glucotrol in addition.  PHYSICAL EXAMINATION:  VITAL SIGNS:  Blood pressure 140/82, heart rate 88, respiratory rate 16, O2 saturations 99%.  He has some tenderness of his right leg along the anserine bursa but good range of motion. Deep tendon reflexes were symmetric in the upper and lower extremity. Range of motion of his shoulder continues to be mildly limited.  IMPRESSION: 1. Right shoulder pain most likely on the basis of rotator cuff tendinopathy. 2. Cervical spondylosis. 3. Other medical problems per primary care physician.  DISPOSITION: 1. Continue on OxyContin 20 mg 1 p.o. b.i.d. #60 with no refill. 2. Continue on other medications. 3. Followup with me in 8 weeks. DD:  08/08/00 TD:  08/08/00 Job: [id number]

## 2011-03-09 NOTE — Consult Note (Signed)
[facility]  Patient:    Gregory [name], Gregory [name]                     MRN: [id number] Proc. Date: 04/22/00 Adm. Date:  [id number] Attending:  Nicholaus Bloom CC:         [name], M.D.                          Consultation Report  FOLLOW-UP EVALUATION  Gregory [name] comes in for follow-up evaluation of his chronic neck pain and right shoulder discomfort.  Since his previous evaluation, the patient has noted his shoulder pain has come and gone.  He was recently diagnosed as having diabetes by Dr. ___________.  He does note that he is doing better with the OxyContin. He spoke with Gaspar Skeeters with regard to his shoulder and he did not advocate further injections at this time.  CURRENT MEDICATIONS:  Vioxx, glucosamine, OxyContin, Centrum, Tums, extra strength Tylenol, and now Glucotrol.  PHYSICAL EXAMINATION:  VITAL SIGNS:  Blood pressure 167/81, heart rate 74, respiratory rate 16.  O2 saturation is 99%.  Pain level is 4/10.  EXTREMITIES:  The patient demonstrated ability to abduct the left shoulder completely, with right limited to about 90 degrees.  He had pain on internal and external rotation.  Deep tendon reflexes were symmetric in the upper extremity.  Range of motion of the neck was mildly restricted.  He does note some constipation from the OxyContin, but otherwise is tolerating it.  IMPRESSION:  1. Right shoulder pain which I suspect is some rotator cuff tendinopathy.  2. Cervical spondylosis.  3. Other medical problems per primary care physician.  DISPOSITION:  1. Increased OxyContin to 20 mg in the evening, 1 p.o. in p.m.  2. Other medications per primary care physician.  3. Follow up with me in 8 weeks.  He is to stop by in about 3/4 weeks with     regard to another prescription for his OxyContin.  He is still driving     during the day, so we did not advocate a b.i.d. dosing.  ADDENDUM:  The patient has redness of his left eye.  He is blind  in his right eye and apparently he is using drops fairly regularly for this. DD:  04/22/00 TD:  04/22/00 Job: [id number]

## 2011-03-09 NOTE — Assessment & Plan Note (Signed)
His device is working normally. His high voltage impedance is high on the SVC coil. This has been stable.

## 2011-03-09 NOTE — Op Note (Signed)
NAMEBINYOMIN, WOODWORTH              ACCOUNT NO.:  [id number]   MEDICAL RECORD NO.:  [id number]          PATIENT TYPE:  INP   LOCATION:  2315                         FACILITY:  [facility]   PHYSICIAN:  Crissie Sickles. Conrad Rockledge, M.D.   DATE OF BIRTH:  23-Dec-1932   DATE OF PROCEDURE:  DATE OF DISCHARGE:                                 OPERATIVE REPORT   DATE OF PROCEDURE:  December 02, 2004.   TYPE OF PROCEDURE:  Transesophageal echocardiographic probe insertion and  monitoring.  Coronary artery bypass grafting body of report as covered by  Dr. Arvid Right, who placed the transesophageal echo probe into Mr. Prada  for perioperative assessment.   HISTORY:  Mr. Colocho is a 77 year old gentleman, who had an acute myocardial  infarction and known coronary artery disease, who presents for coronary  artery bypass grafting.   DESCRIPTION OF PROCEDURE:  After uneventful induction of anesthesia and  endotracheal intubation, the transesophageal echo probe was easily passed  into the stomach.  Initial short access view of the left ventricle showed  some anterior wall hypokinesis of the inferior wall, and the septum of the  left ventricle at the level of the papillary muscles contracted normally.  There was no left ventricular hypertrophy.  Turning to the mitral valve,  there was trace to 1+ central mitral regurgitation on placing color flow  Doppler across the valve; however, the valve structures looked normal.  The  valves coapted normal without color flow Doppler.  The left atrium was  normal sized.  The flow in the pulmonary veins was forward.  Intra-atrial  septum was normal.  The right side of the heart was normal.  Turning to the  aortic valve, it was tricuspid, normal in structure, and while placing color  flow Doppler across the valve in the longitudinal view there was no aortic  stenosis or aortic insufficiency.  Left ventricular outflow tract was  normal.   The patient was placed on  cardiopulmonary bypass by Dr. Cyndia Bent and underwent  coronary artery bypass grafting x5.  At the end of the bypass run, the left  ventricle seemed to contract unchanged from preop, and the patient was  discontinued off of cardiopulmonary bypass with minimal inotropes.  At that  time, the left ventricle, unchanged from preop, with mild anterior wall  hypokinesis.  The mitral valve was unchanged both structurally and on  placing color flow Doppler across the valve with trace to 1+ central mitral  regurgitation.  Left ventricular outflow tract was normal.  The left atrium  and right side of the heart were normal.   At the end of the procedure, the transesophageal echo probe was easily  removed from the patient, and he was taken intubated in a stable condition  to the intensive care unit.      KDO/MEDQ  D:  12/02/2004  T:  12/03/2004  Job:  [id number]   cc:   Anesthesia Department

## 2011-03-09 NOTE — Discharge Summary (Signed)
[name], [name] NO.:  [id number]   MEDICAL RECORD NO.:  [id number]          PATIENT TYPE:  INP   LOCATION:  2038                         FACILITY:  [facility]   PHYSICIAN:  Kaylyn Lim., M.D.DATE OF BIRTH:  1933-01-15   DATE OF ADMISSION:  12/16/2004  DATE OF DISCHARGE:  12/19/2004                                 DISCHARGE SUMMARY   DISCHARGE DIAGNOSES:  1.  Congestive heart failure exacerbation.  2.  History of ischemic cardiomyopathy with an ejection fraction of      approximately 30%.  3.  Coronary artery disease, status post recent coronary artery bypass      grafting.  4.  Dyslipidemia.  5.  Diabetes mellitus type 2.  6.  Bronchitis.   HISTORY OF PRESENT ILLNESS:  The patient is 77 year old, white male recently  discharged from hospital on December 10, 2004,  after undergoing coronary  artery bypass grafting.  He presented back on December 16, 2004, with  complaint of malaise, orthopnea, increasing lower extremity edema and  dyspnea on exertion.  The patient was admitted to the hospital for CHF  exacerbation.   HOSPITAL COURSE:  The patient was admitted to telemetry bed.  Serial cardiac  enzymes were obtained and were negative.  The patient had a BNP level of  1600 on admission.  His medications were changed to Coreg 3.125 mg b.i.d.,  Altace 1.25 mg daily, digoxin 0.125 mg daily and Lasix. He had significant  diuresis.  On hospital day #1 and #2, his chest x-ray showed significant  improvement.  He underwent echocardiogram on December 18, 2004, showing mild  MR and TR and an EF of approximately 30%.  He did have vague anteroseptal  and apical wall motion abnormalities noted.  These were unchanged from prior  echocardiogram.  His posterior inferior walls did show some improvement from  last echocardiogram.  He has now been up and ambulatory.  His weight is  decreased from 183 pounds down to 179 pounds.  He has no further dyspnea.   DISCHARGE  MEDICATIONS:  1.  Aspirin 325 mg daily.  2.  Amaryl 2 mg daily.  3.  Lipitor 20 mg daily.  4.  Neurontin 300 in the morning, 600 in the evening.  5.  Synthroid 50 mcg daily.  6.  Amiodarone 200 mg daily.  7.  Azithromycin 500 mg daily x2 days.  8.  Digoxin 0.125 mg daily.  9.  Coreg 3.125 mg b.i.d.  10. Altace 1.25 mg daily.  11. Lasix 40 mg daily.   I did go over symptoms of CHF with him at length.  I instructed him should  he have any weight gain greater than 3 pounds in 24 hours or increasing  lower extremity edema or orthopnea, he is to take in at an additional Lasix  pill that day.   DISCHARGE LABORATORY DATA AND X-RAY FINDINGS:  His laboratory values on  discharge were stable with a hemoglobin of 11.0, white count of 9.8,  platelets 389.  BUN and creatinine are 17 and 1.6.   ACTIVITY:  Post CABG restrictions.  He is to resume home PT as before.   DIET:  ADA, 2000 calorie, low salt, low cholesterol.   WOUND CARE:  Routine post bypass.   INSTRUCTIONS:  Special instructions were regarding daily weights as well as  special instructions regarding his Lasix as listed above.   FOLLOW UP:  He will follow up with his scheduled appointment with Dr.  Burnard Bunting at [facility] on December 25, 2004, and follow up with Dr.  Warren Danes at [facility] on December 28, 2004.  I did encourage  the patient to call the office should he have any further questions. These  instructions were given to the patient as well as his wife.      TWK/MEDQ  D:  12/19/2004  T:  12/19/2004  Job:  [id number]   cc:   Burnard Bunting, M.D.  [address]  [place]  [place] [address]  Fax: [phone]   Darlin Coco, M.D.  [id number], [address] [place]  [place] [address]  Fax: [phone]

## 2011-03-09 NOTE — Progress Notes (Signed)
HPI Gregory [name] returns today for followup. He is a 77 year old male with history of ischemic cardiomyopathy, congestive heart failure, ventricular tachycardia, status post ICD implantation. The patient denies chest pain or shortness of breath. He continues to work and cut his grass and work in the yard.  He has had no ICD shocks. No Known Allergies   Current Outpatient Prescriptions  Medication Sig Dispense Refill  . acetaminophen (TYLENOL) 500 MG tablet Take 500 mg by mouth as needed.        Marland Kitchen amiodarone (PACERONE) 200 MG tablet Take 100 mg by mouth daily.        Marland Kitchen aspirin 325 MG tablet Take 325 mg by mouth daily.        Marland Kitchen atorvastatin (LIPITOR) 20 MG tablet Take 20 mg by mouth daily.        . calcium carbonate (TUMS EX) 750 MG chewable tablet Chew 1 tablet by mouth daily.        . carvedilol (COREG) 12.5 MG tablet Take 12.5 mg by mouth 2 (two) times daily.        . Cholecalciferol (VITAMIN D-3 PO) Take by mouth daily.        . dorzolamide-timolol (COSOPT) 22.3-6.8 MG/ML ophthalmic solution       . furosemide (LASIX) 40 MG tablet Take 20 mg by mouth daily.       Marland Kitchen gabapentin (NEURONTIN) 300 MG capsule Take 600 mg by mouth daily.        Marland Kitchen glimepiride (AMARYL) 2 MG tablet Take 2 mg by mouth 2 (two) times daily.        . IRON PO Take by mouth daily.        Marland Kitchen levothyroxine (SYNTHROID, LEVOTHROID) 125 MCG tablet Take 125 mcg by mouth daily. 6 DAYS OUT OF 7 DAYS       . LUMIGAN 0.01 % SOLN       . Multiple Vitamins-Minerals (CENTRUM SILVER PO) Take by mouth daily.        . nitroGLYCERIN (NITROSTAT) 0.4 MG SL tablet Place 0.4 mg under the tongue every 5 (five) minutes as needed.        . ramipril (ALTACE) 1.25 MG capsule       . TEMAZEPAM PO Take by mouth at bedtime as needed.           Past Medical History  Diagnosis Date  . Ischemic heart disease   . Myocardial infarction     LARGE ANTERIOR WALL  . Hypothyroidism   . Diabetes mellitus   . Dyslipidemia   . Essential hypertension   .  Ischemic cardiomyopathy   . Diabetic neuropathy     ROS:   All systems reviewed and negative except as noted in the HPI.   Past Surgical History  Procedure Date  . Coronary artery bypass graft 12/02/04  . Cardiac defibrillator placement   . Cholecystectomy, laparoscopic   . 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 Shoulder surgery     RIGHT     No family history on file.   History   Social History  . Marital Status: Married    Spouse Name: N/A    Number of Children: N/A  . Years of Education: N/A   Occupational History  . Not on file.   Social History Main Topics  . Smoking status: Former Research scientist (life sciences)  . Smokeless tobacco: Not on file  . Alcohol Use: Not on file  . Drug Use: Not on  file  . Sexually Active: Not on file   Other Topics Concern  . Not on file   Social History Narrative  . No narrative on file     BP 124/58  Pulse 64  Ht 5\' 6"  (1.676 m)  Wt 184 lb 1.9 oz (83.516 kg)  BMI 29.72 kg/m2  Physical Exam:  Well appearing NAD HEENT: Unremarkable Neck:  No JVD, no thyromegally Lymphatics:  No adenopathy Back:  No CVA tenderness Lungs:  Clear. Well-healed ICD incision. HEART:  Regular rate rhythm, no murmurs, no rubs, no clicks Abd:  Flat, positive bowel sounds, no organomegally, no rebound, no guarding Ext:  2 plus pulses, no edema, no cyanosis, no clubbing Skin:  No rashes no nodules Neuro:  CN II through XII intact, motor grossly intact DEVICE  Normal device function.  See PaceArt for details.   Assess/Plan:

## 2011-03-09 NOTE — Assessment & Plan Note (Signed)
He has class II congestive heart failure symptoms. I've asked that he maintain a low-salt diet. He does admit to some dietary indiscretion. Continue his current medications.

## 2011-03-09 NOTE — H&P (Signed)
[facility]  Patient:    Gregory [name], SERVICE                     MRN: [id number] Adm. Date:  [id number] Attending:  Nicholaus Bloom CC:         [name], M.D.   History and Physical  FOLLOW-UP EVALUATION:  [name] comes in for follow-up evaluation of his chronic right shoulder pain on the basis of rotator cuff tendopathy and cervical spondylosis. He is tolerating the OxyContin well, although he does have some constipation from it. He is noticing that it is not sedating him to any great extent, and he feels comfortable continuing on this if not increasing it to one twice a day. He is not driving as much as he was in the past.  PHYSICAL EXAMINATION:  VITAL SIGNS:  Blood pressure is 153/67, heart rate is 78, respiratory rate is 14, O2 saturation 99%, pain level is 6/10, temperature 99.1.  NEUROLOGICAL:  The patient continues to have limitation in range of motion of his right shoulder with abduction to 90 degrees. He has pain on internal/external rotation. Neck demonstrates no change.  Additional history revealed that he is getting some numbness and tingling in his feet bilaterally.  His other medications include Vioxx, glucosamine, Centrum, Tums, Extra-Strength Tylenol, and Glucotrol.  IMPRESSION: 1. Right shoulder pain on the basis of rotator cuff tendopathy. 2. Cervical spondylosis. 3. Other medical problems per primary care physician.  DISPOSITION: 1. Increase OxyContin to 20 mg one p.o. b.i.d., #60 with no refill. 2. Other medications per primary care physician. 3. Follow up with me in eight weeks. He is to stop by in three to four weeks    for another prescription for his OxyContin. DD:  06/17/00 TD:  06/17/00 Job: [id number]

## 2011-03-09 NOTE — Op Note (Signed)
Gregory [name], Gregory [name]              ACCOUNT NO.:  [id number]   MEDICAL RECORD NO.:  [id number]          PATIENT TYPE:  INP   LOCATION:  2315                         FACILITY:  [facility]   PHYSICIAN:  Gilford Raid, M.D.     DATE OF BIRTH:  June 18, 1933   DATE OF PROCEDURE:  12/02/2004  DATE OF DISCHARGE:                                 OPERATIVE REPORT   PREOPERATIVE DIAGNOSIS:  Severe three-vessel coronary artery disease and  severe left ventricular dysfunction; status post acute myocardial  infarction.   POSTOPERATIVE DIAGNOSIS:  Severe three-vessel coronary artery disease and  severe left ventricular dysfunction; status post acute myocardial  infarction.   OPERATIVE PROCEDURE:  1.  Emergency median sternotomy.  2.  Extracorporeal circulation.  3.  Coronary artery bypass graft surgery x4 using:      1.  Left internal mammary graft to the left anterior descending coronary          artery.      2.  Saphenous vein graft to the second diagonal branch of the LAD.      3.  Saphenous vein graft to the obtuse marginal branch of the left          circumflex coronary artery.      4.  Saphenous vein graft to the distal right coronary artery.  4.  Endoscopic vein harvesting from the right leg.   ATTENDING SURGEON:  Gilford Raid, M.D.   ASSISTANT:  Leta Baptist, PA   ANESTHESIA:  General endotracheal.   CLINICAL HISTORY:  This patient is a 77 year old gentleman with a long  history of diabetes; who presented with a one-year history of exertional  substernal chest discomfort.  He was admitted with an acute myocardial  infarction; with a total CK of 791, an MB fraction of 62.  His Troponin was  5.59.   An electrocardiogram was consistent with a non-Q wave myocardial infarction.  He was started on heparin and Integrillin; stabilized without recurrent  pain.  He underwent cardiac catheterization by Dr. Acie Fredrickson, and this showed  severe three-vessel coronary artery disease.  The LAD was  subtotally  occluded proximally, with sluggish TIMI-I to II flow distally.  There were  two small diagonals, both of which appeared to have proximal disease in  them.  The left circumflex was occluded proximally, with a large marginal  branch filling by collaterals.  The right coronary artery was occluded  proximally, with the mid and distal vessel filling by collaterals.  Left  ventricular ejection fraction was about 20%, with moderate to severe  dysfunction and moderate LV enlargement.  There was akinesis to dyskinesis  of the anterior wall and apex.  There was severe hypokinesis of the inferior  wall, and the base contracted fairly normally.  There was 1+ mitral  regurgitation.   An echocardiogram was obtained, which showed mild mitral regurgitation.  It  was read as showing an ejection fraction of 30-40%, but I reviewed this echo  and clearly the ejection fraction was not that good.  The patient was seen  by Dr. Servando Snare in consultation, and was scheduled for  surgery next Tuesday;  but, overnight the patient developed acute pulmonary edema that awakened him  from sleep.  He was transferred to the CCU under the care of Dr. Pernell Dupre.  After we discussed the patient's case, I felt that the best treatment would  be to insert an intra-aortic balloon pump and try to stabilize him and  resolve his pulmonary edema prior to doing surgery.  The balloon pump was  inserted and the patient taken to the CCU, where he was medically  stabilized.  His oxygenation improved and he was weaned from 100% FI-O2 down  to 4 L, with oxygen saturation of 98%.  He was diuresing well.  He remained  hemodynamically stable without chest pain or shortness of breath.   I felt that the best course of action would be to proceed with surgery  today, given that his entire heart was supplied by a subtotally occluded  LAD.  His CPK enzymes did increase this morning again to 1544, with an MB of  80.  He developed new  anterior ST elevation, suggesting that he had further  ischemia.  I discussed the operative procedure of emergency coronary bypass  surgery with the patient and his family; including the alternatives,  benefits and risks; these included bleeding, blood transfusion, infection,  stroke, myocardial infarction, organ failure and death.  I discussed the  high risk of the surgery with them, and they all understood and agreed to  proceed.   OPERATION PROCEDURE:  The patient was taken to the operating room and placed  on the table in supine position.  After induction of general endotracheal  anesthesia, a Foley catheter was removed from the bladder and a new catheter  was placed.  Then the chest, abdomen and both lower extremities were prepped  and draped in the usual sterile manner.  Transesophageal echocardiogram was  performed by anesthesiology.  This showed severe left ventricular  dysfunction, with ejection fraction of about 25%.  There was akinesis of the  anterior wall and apex, and hypokinesis of the lateral wall and inferior  wall.  Right ventricular function was well preserved.  There was 1+ mitral  regurgitation.   Then the chest was opened through a median sternotomy incision, and the  pericardium opened in the midline.  Examination of the heart showed good  ventricular contractility.  The ascending aorta had no palpable plaques in  it.   Then the left internal mammary artery was harvested from the chest wall with  a pedicle graft.  This was a medium-caliber vessel, with excellent blood  flow through it.  At the same time, a segment of greater saphenous vein was  harvested from the right leg, and this vein was of medium size and good  quality.   Then the patient was heparinized and when an adequate activated clotting  time was achieved, the distal ascending aorta was cannulated using a 20-  [place] aortic cannula for arterial inflow.  Venous outflow was achieved using a two-stage  venous cannula to the right atrial appendage.  An  antegrade cardioplegia and vent cannulae were inserted in the aortic root.  A retrograde cardioplegia cannula was inserted through the right atrium and  into the coronary sinus.   Then the patient was placed on cardiopulmonary bypass, and the distal  coronary was identified.  The LAD was a medium-sized vessel that was lying  deep beneath the epicardial fat throughout its course, and in the  intraventricular groove.  It was heavily  diseased proximally.  The first  diagonal was small and lying deep beneath the fat and was not felt to be  graftable.  The second diagonal was slightly larger and felt to be  graftable, since it was visible on the surface of the heart.  The obtuse  marginal branch was a large branch that was graftable, with mild distal  disease in it.  The right coronary artery gave off a small posterior  descending and a couple of small posterolateral branches.  None of these  branches were directly graftable.  The distal right coronary artery just  before the takeoff of the posterior descending branch was visualized, and  was suitable for grafting.   Then the aorta was crossclamped, and 300 cc of cold blood antegrade  cardioplegia was administered in the aortic root for quick arrest of the  heart.  This was followed by 300 cc of cold blood retrograde cardioplegia.  Systemic hypothermia to 20 degrees Centigrade and topical hypothermic iced  saline was used.  A temperature probe was placed in the septum, insulated  and padding the pericardium.  Additional doses of retrograde cardioplegia  were given at about 20 min intervals, maintaining myocardial temperature  around 10 degrees Centigrade.   Then, the first distal anastomosis was performed to the obtuse marginal  branch.  The internal diameter was about 1.75 mm.  The conduit used was a  segment of greater saphenous vein; the anastomosis performed in an end-to-  side manner  using continuous 7-0 Prolene suture.  Flow was administered  through the graft and was excellent.   The second distal anastomosis was performed to the second diagonal branch.  The internal diameter of this vessel was about 1.5 mm. The conduit used was  a second segment of saphenous vein; anastomosis performed in end-to-side  manner using continuous 7-0 Prolene suture.  The flow was measured through  the graft and was excellent.   The third distal anastomosis was performed to the distal right coronary  artery.  The internal diameter of this vessel was about 1.6 mm.  The conduit  used was a third segment of greater saphenous vein; anastomosis performed in  end-to-side manner using continuous 7-0 Prolene suture.  Flow was measured  through the graft and was excellent.   The fourth distal anastomosis was performed of the mid portion of the left  anterior descending artery. The internal diameter was 2.5 mm.  The conduit used was a left internal mammary graft, and this was brought through an  opening in the left pericardium and through the phrenic nerve.  It was  anastomosed to the LAD in an end-to-side manner using continuous 8-0 Prolene  suture.  The pedicle was tacked up to the epicardium with 6-0 Prolene  sutures.   Then with the crossclamp in place, the three proximal vein graft anastomoses  were formed to the aortic root in an end-to-side manner, using continuous 6-  0 Prolene suture.  The clamp was removed from the mammary pedicle.  There  was rapid warming of the ventricular septum and return of spontaneous  ventricular fibrillation.  The crossclamp was removed with a time of 94 min,  and the patient spontaneously converted to sinus rhythm.   The proximal and distal anastomoses appeared hemostatic, and lie of the  graft satisfactory.  Graft markers were placed around the proximal  anastomosis.  Two temporary right ventricular and right atrial pacing wires  were placed and brought  out through the skin.  When the patient had rewarmed to 37 degrees Centigrade, he was weaned from  cardiopulmonary bypass on low-dose Dopamine.  Total bypass time was 127 min.  Cardiac function appeared improved by transesophageal echocardiogram, and  cardiac output was __________ per minute.  The internal aortic balloon pump  was then placed on 1-2 augmentation.  Protamine was given and the venous and  aortic cannulae were removed without difficulty.  Attention was given  continuous to platelets, since he had been on Integrillin immediately  preoperatively.  Hemostasis was achieved.  Three chest tubes were placed;  two in the posterior pericardium and one in the left pleural space and in  the anterior mediastinum.  The sternum was closed with #6 stainless steel  wire. The fascia was closed in a continuous #1 Vicryl suture.  The  subcutaneous tissue was closed with continuous 2-0 Vicryl.  The skin closed  with 3-0 Vicryl subcuticular closure.  The lower extremity vein harvest site  was closed in layers in a similar manner.   The sponge, needle and instrument counts were correct as reported by scrub  nurse.  A dry sterile dressing applied over the incisions, around the chest  tubes which were connected to suction.   The patient remained hemodynamically stable and was transported to the SICU  in guarded but stable condition.    BB/MEDQ  D:  12/02/2004  T:  12/03/2004  Job:  [id number]

## 2011-03-09 NOTE — H&P (Signed)
NAMEGOTTFRIED, GIONFRIDDO NO.:  [id number]   MEDICAL RECORD NO.:  [id number]          PATIENT TYPE:  EMS   LOCATION:  [facility]                         FACILITY:  [facility]   PHYSICIAN:  Darlin Coco, M.D. DATE OF BIRTH:  1933-05-16   DATE OF ADMISSION:  11/29/2004  DATE OF DISCHARGE:                                HISTORY & PHYSICAL   CHIEF COMPLAINT:  Chest pain.   HISTORY:  This is a 77 year old, married, Caucasian gentleman admitted with  chest pain.  He does not have any prior history of known coronary artery  disease.  Today at about 2 p.m., he was watching television in the afternoon  and developed substernal tightness, not too long after lunch.  He felt that  it might be some indigestion but it persisted and lasted about 45 minutes,  and he had his wife drive him to the emergency room.  He did take two  aspirin at home prior to leaving.  The pain was a dull discomfort in the  center of his chest.  It did not radiate to the jaw or arm.  There was no  associated nausea, vomiting, or diaphoresis.  The patient does not give any  history of exertional chest tightness but has had dyspnea on exertion.  He  has multiple risk factors for coronary artery disease.   1.  He has been an adult onset borderline diabetic for about ten years and      is on Glucophage XR 500 daily as well as a diabetic diet.  2.  He has a history of hypercholesterolemia and is on Lipitor.  3.  He has had a history of hypertension and is on Accuretic 20/12.5 once a      day.  4.  He does have diabetic neuropathy and takes gabapentin 300 in the      morning, 600 at night.  5.  He also has a history of hypothyroidism and is on replacement therapy      with Synthroid 50 mcg a day.   He has not had any prior cardiac workup such as treadmill or Cardiolite,  etcetera.  He has not been taking any aspirin on a regular basis but takes  it p.r.n. for headache or joint pain.   FAMILY HISTORY:  Reveals  that his father died at 9.  At age 46, his father  had his aortic valve replaced.  The father died of cancer and not of his  heart.  The mother died of cancer but had a history of hypertension and  diabetes.   SOCIAL HISTORY:  Reveals that he is married.  He is retired.  He used to  work in a Retail buyer.  He does smoke a pipe, and he does  not use alcohol.  They have no children now.  They had only one child, a  daughter, who died of juvenile onset diabetes.   PREVIOUS SURGERY:  1.  Several surgical procedures on his right eye socket.  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  2.  Right shoulder surgery  by Dr. Lauretta Grill.   ALLERGIES:  No known drug allergies.   REVIEW OF SYSTEMS:  He does have a lot of post prandial gas.  He does not  have any history of known cholelithiasis.  He has had no history of melena  or peptic ulcer.  GENITOURINARY:  Reveals no dysuria.  He does have  occasional hesitancy.  He has had a recent upper respiratory infection and  has been taking some over the counter Contact and has noticed some increased  difficulty with voiding since starting on the Contact.  Remainder of the  review of systems is negative in detail.   PHYSICAL EXAMINATION:  VITAL SIGNS:  Blood pressure is 121/76, pulse 72  regular, respirations are normal.  HEENT:  The patient wears a right eye patch covering a vacant right eye  socket.  NECK:  Jugular venous pressure is normal.  Carotids reveal no bruits.  CHEST:  Clear to percussion and auscultation.  HEART:  Reveals no murmur, gallop, rub, or click.  ABDOMEN:  Soft without hepatosplenomegaly or masses.  EXTREMITIES:  Show no phlebitis or edema.  Pedal pulses are good.   His electrocardiogram shows normal sinus rhythm.  He has deep Q waves II,  III, and aVF which appear old and are consistent with an old inferior wall  myocardial infarction of which there is no clinical history.  The patient  had a right bundle  branch block of uncertain age.  There are no acute ST  segment changes.  The patient has been pain free since arrival in the  emergency room and serial EKGs have shown no change.  His enzymes, however,  are positive.  CK-MB are 4.5, 13.3, and 37.9.  Troponin I is 0.19, 0.99, and  5.59.  His sodium 145, potassium 4.5, BUN 10, creatinine 1.1, blood sugar  192.  Clotting studies normal.   IMPRESSION:  1.  Chest pain, now resolved with positive enzymes consistent with a known      ST elevation myocardial infarction.      1.  The patient also has what appears to be an old inferior wall          myocardial infarction.  2.  Right bundle branch block, uncertain age.  3.  Adult onset diabetes mellitus, on Glucophage.  4.  Essential hypertension.  5.  Compensated hypothyroidism.  6.  Hypercholesterolemia by history.  7.  Ongoing pipe smoker.  8.  Congenital absence of right eye.   DISPOSITION:  1.  We are admitting to telemetry.  2.  We will treat him with IV heparin.  3.  We will use p.r.n. nitroglycerin.  4.  We will continue with aspirin.  5.  We will hold his Accuretic because his blood pressure is low normal, and      we will hold his Glucophage anticipating cardiac cath.  6.  We will plan cardiac catheterization in the a.m. with one of my      associates.      TB/MEDQ  D:  11/29/2004  T:  11/29/2004  Job:  [id number]   cc:   Burnard Bunting, M.D.  [address]  [place]  [place] [address]  Fax: [phone]

## 2011-03-09 NOTE — H&P (Signed)
Gregory [name], Gregory [name] NO.:  [id number]   MEDICAL RECORD NO.:  [id number]          PATIENT TYPE:  INP   LOCATION:  [place]                         FACILITY:  [facility]   PHYSICIAN:  Kaylyn Lim., M.D.DATE OF BIRTH:  09-01-1933   DATE OF ADMISSION:  12/16/2004  DATE OF DISCHARGE:                                HISTORY & PHYSICAL   PRIMARY CARE PHYSICIAN:  Burnard Bunting, M.D.   PRIMARY CARDIOLOGIST:  Darlin Coco, M.D.   HISTORY OF PRESENT ILLNESS:  The patient is a 77 year old white male past  medical history of coronary artery disease (status post recent coronary  artery bypass grafting), diabetes mellitus, hypertension, dyslipidemia, who  was  just recently discharged from the hospital five days ago after emergent  CABG.  Since discharge home, he reports increasing lower extremity edema,  progressive dyspnea on exertion, and an approximately 10 pound weight gain  as well as decreased exercise tolerance.  On discharge from the hospital,  Lasix was held secondary to worsening renal function and a BUN and  creatinine of 1.8 on discharge.  The patient denies any significant chest  pain or palpitations.  He does report recent head cold.  No significant  fever.  He does report off and on nonproductive cough with occasional  scant phlegm.   REVIEW OF SYSTEMS:  Otherwise review of systems as per HPI.   CURRENT MEDICATIONS:  1.  Aspirin.  2.  Amiodarone 200 mg b.i.d.  3.  Amaryl 2 mg daily.  4.  Neurontin daily.  5.  Toprol XL 25 mg daily.  6.  Lipitor 20 mg daily.  7.  Synthroid 50 mcg daily.   ALLERGIES:  None.   FAMILY HISTORY:  Positive for cancer and valvular heart disease in his  father.   SOCIAL HISTORY:  He is married.  No alcohol.  He used to be a pipe smoker.   PHYSICAL EXAMINATION:  VITAL SIGNS:  He is afebrile, temperature is 97.0,  blood pressure is 125/76, heart rate is 75 and regular.  Sating 95 to 97% on  room air.  GENERAL:  He is a  very pleasant, elderly, white male, alert and oriented x  4, no acute distress.  NECK:  Supple.  No lymphadenopathy.  Carotids 2+.  No JVD.  LUNGS:  Decreased breath sounds and crackles bilaterally a third up, left  greater than right.  HEART:  Regular with a soft 1/6 holosystolic murmur.  No S3 or S4 heard.  ABDOMEN:  Soft, nontender, nondistended.  EXTREMITIES:  Warm with 2+ edema.  Vein graft harvest sites look good.  SKIN:  Chest tube sites are healing appropriately and sternotomy wound looks  excellent.   SIGNIFICANT LABORATORY VALUES:  Hemoglobin is 11.8, white blood cell count  is 14.8, platelets are 504.  BUN and creatinine are 22 and 1.6.  Potassium  is 3.8.  Cardiac enzymes are negative x 1.  Chest x-ray shows mild pulmonary  edema with bilateral effusions, left greater than right.  CT scan shows no  thoracic aneurysm or dissection with moderate bilateral effusions.  Echo,  done  December 01, 2004, showed an EF of approximately 30% with mild MR.  EKG, today, shows a normal sinus rhythm, rate of 68 per minute with first  degree A-V block, right bundle branch block, and old inferior and  anterolateral MI.   IMPRESSION:  1.  Congestive heart failure exacerbation.  2.  Recent emergent coronary artery bypass graft.  3.  History of dyslipidemia.  4.  Diabetes mellitus.   PLAN:  1.  We will admit to telemetry.  2.  Continue diuresis.  3.  We will DC Toprol and start Coreg 3.125 mg b.i.d.  4.  The patient is not on an ACE inhibitor at this time, would start with      Altace 1.25 mg daily and increase as tolerated.  We will also add      digoxin 0.125 mg daily.  5.  At the current time, we will continue his amiodarone.  He is in a normal      sinus rhythm, however, he is postoperative within ten days from his CABG      and high risk of recurrent atrial fibrillation.  We will continue at the      current dose.  6.  From his diabetes standpoint, we will continue his Amaryl with  sliding      scale insulin.      TWK/MEDQ  D:  12/16/2004  T:  12/16/2004  Job:  [id number]

## 2011-03-09 NOTE — Consult Note (Signed)
Gregory [name], KLOUDA NO.:  [id number]   MEDICAL RECORD NO.:  [id number]          PATIENT TYPE:  INP   LOCATION:  2037                         FACILITY:  [facility]   PHYSICIAN:  Lanelle Bal, MD    DATE OF BIRTH:  03/01/1933   DATE OF CONSULTATION:  DATE OF DISCHARGE:                                   CONSULTATION   REQUESTING PHYSICIAN:  Dr. Acie Fredrickson.   FOLLOWUP CARDIOLOGIST:  Dr. Mare Ferrari.   PRIMARY CARE PHYSICIAN:  Dr. Joya Salm and Dr. Deatra Ina.   REASON FOR CONSULTATION:  Acute myocardial infraction, severe three vessel  coronary artery disease, mitral regurgitation, question degree.  Depressed  LV function.   HISTORY OF PRESENT ILLNESS:  The patient is a 77 year old male who has had  at least a 10-year history of diabetes and presents with a 1-year history of  exertional, substernal chest discomfort, usually associated with some chest  tightness but without nausea or radiation or vomiting or diaphoresis.  For  approximately a year, he has noted these symptoms, usually when working in  his yard or other exertional activities.  Yesterday, approximately 1 in the  afternoon, he had onset of pain while watching TV.  It persisted longer than  usual and the patient came to the emergency room at [facility].  CK-MBs  were obtained with a total CK of 791, MB of 62 and rose up to 82.  Troponin  hit a maximum of 5.59.  EKG showed non-Q wave MI.  The patient was started  on heparin and Integrilin and stabilized medically, without recurrent pain  yesterday.  Today, he underwent cardiac catheterization and has significant  three vessel coronary artery disease and is referred for consideration for  bypass surgery.   The patient is a poorly controlled diabetic, denies hypertension, has  hyperlipidemia.  Smokes a pipe 2-3 times a day.  Has no family history of  coronary artery disease.  His father has a history of aortic valve  replacement, died at age 52.  Mother died  in her 66s of cancer. The patient  denies history of stroke or peripheral vascular disease or renal  insufficiency.  He had an injury to his left lower extremity in an  industrial accident and was scarred and varicose veins in the left leg.   PAST SURGICAL HISTORY:  1.  Right eye socket surgery for congenital abnormality x 2.  2. Right      shoulder surgery, Dr. Eddie Dibbles, [place].  3. History of tonsillectomy,      adenoidectomy.   The patient is married.  Lives with his wife.  Had one daughter who died of  juvenile diabetes.  He is a retired Editor, commissioning.  Denies any alcohol  use.   MEDICATIONS:  1.  Lipitor.  2. Glucophage XR.  3. Accuretic.  4. Gabarentin 300 mg in the      morning and 600 at night.  5. Synthroid 50 mcg per day.   ALLERGIES:  Denies any allergies.   REVIEW OF SYSTEMS:  Cardiac:  Positive for chest pain, exertional shortness  of breath, presyncope.  Has noted some feelings of faintness with exertion,  especially over the past seven months.  He has mild lower extremity edema.  Denies resting shortness of breath.  Denies palpitations.  General:  Denies  any constitutional symptoms of fever, chills.  Does have general fatigue.  Respiratory:  Denies hemoptysis.  He has had a recent cough, runny nose and  cold symptoms for the past week prior to this episode.  Gastrointestinal:  Post prandial gas, bloating.  Neurologic:  History of diabetic neuropathy,  especially in his feet.  Had pain in his right shoulder with decreased range  of motion.  GU:  Has had some occasional nocturia.  Has had a recent  infection as noted above with upper respiratory tract infection symptoms,  draining nose.  He denies any fevers.  He has a history of recently  diagnosed hypothyroidism.  Recently started on Synthroid.  He has no history  of TIAs or amaurosis.  He does have lower extremity varices related to  injury.  Denies claudication.   PHYSICAL EXAMINATION:  Blood pressure 113/75,  pulse is 73, respiratory rate  18, O2 saturation 98% on room air.  Height 66 inches.  Weight 178.   GENERAL:  The patient appears his stated age.  HEENT:  Pupils equal, round, reactive to light.  Neck is without jugular  venous distension.  He has no carotid bruits.  LUNGS:  Distant breath sounds bilaterally.  CARDIAC:  I do not appreciate murmur, mitral insufficiency, but his heart  sounds are distant.  ABDOMEN:  Protuberant, without palpable masses or tenderness.  LOWER EXTREMITIES:  He has varicose veins in the left lower extremity.  He  has 2+ DP and PT pulses bilaterally.  Decreased sensation in both feet.  Right groin is without hematoma with pressure dressing in place from his  recent catheterization.  He has no palpable abdominal aneurysm.   LABORATORY DATA:  White count 12,000, hematocrit 36, platelets 177, INR is  1.1, creatinine 1.1.   TSH is 5.4.  BNP is 92.  Cardiac catheterization reveals decreased LV  dysfunction.  Ejection fraction 20-25%, with 1-2+ but hard to judge degree  of mitral regurgitation.  Left main has mild irregularities.  The LAD is  subtotally occluded with decreased flow.  Two small diagonals, circumflexes  totally occluded proximally.  OM fills back collaterals.  Right coronary  artery is totally occluded with collateral filling.   IMPRESSION:  A 77 year old patient with significant diabetes, poorly  controlled, who presents with acute myocardial infarction, now 51 hours old,  with severe three vessel coronary artery disease, significant LV  dysfunction, question of mitral regurgitation but it is unclear the degree  of this, and one week history of upper respiratory congestion, question  infection.  From the patient's coronary anatomy, coronary artery bypass  grafting is suggested.  The patient, prior to surgery, needs to get better glucose control.  Needs to obtain an echocardiogram and evaluate the degree  of mitral regurgitation.  Would recommend  proceeding with coronary artery  bypass grafting this admission.  Probably, February 14.  After reviewing the  echocardiogram, I will further discuss details with the patient, but in  general, the risks of the procedure including death, infection, stroke,  myocardial infarction, bleeding, blood transfusion have all been discussed  with the patient and his wife and their questions have been answered.      EG/MEDQ  D:  11/30/2004  T:  11/30/2004  Job:  [id number]

## 2011-03-09 NOTE — Patient Instructions (Signed)
Your physician wants you to follow-up in: 12 months with Dr Knox Saliva will receive a reminder letter in the mail two months in advance. If you don't receive a letter, please call our office to schedule the follow-up appointment.   Remote monitoring is used to monitor your Pacemaker of ICD from home. This monitoring reduces the number of office visits required to check your device to one time per year. It allows [place] to keep an eye on the functioning of your device to ensure it is working properly. You are scheduled for a device check from home on 06/07/2011 . You may send your transmission at any time that day. If you have a wireless device, the transmission will be sent automatically. After your physician reviews your transmission, you will receive a postcard with your next transmission date.

## 2011-03-09 NOTE — Consult Note (Signed)
[facility]  Patient:    Gregory [name], Gregory [name]                     MRN: [id number] Proc. Date: 03/22/00 Adm. Date:  [id number] Attending:  Nicholaus Bloom CC:         [name], M.D.                          Consultation Report  FOLLOW-UP EVALUATION:  Gregory [name] comes in for a follow-up.  We had not seen him since February, when we had weaned him off of his OxyContin at that time. He apparently was doing fairly well on his Vioxx and glucosamine, only to go help a neighbor with a Conservation officer, nature.  It was a Psychologist, educational and when he pulled with his right upper extremity, he felt the acute onset of increased right shoulder discomfort.  He presents today with his shoulder bothering him. He has been seen by Dr. Eddie Dibbles for this.  He states his neck is not bothering him.  The patient denied any numbness or tingling out to the right upper extremity or any weakness.  MEDICATIONS:  Vioxx, glucosamine, OxyContin, Centrum, Tums, and Extra Strength Tylenol.  PHYSICAL EXAMINATION:  VITAL SIGNS:  Blood pressure 155/77, heart rate 109, respiratory rate 18, O2 saturation is 99%, pain level is 6 out of 10, temperature is 98.4.  MUSCULOSKELETAL/NEUROLOGIC:  The patient demonstrated deep tendon reflexes symmetric.  Range of motion of his shoulder revealed pain on abduction from 90-120 degrees.  He exhibits tenderness over the posterior aspect of the joint.  Internal rotation increases his discomfort.  External rotation, abduction, and adduction did not increase his pain.  IMPRESSION: 1. Right shoulder pain, which I suspect is a rotator cuff tendinitis versus a    partial tear. 2. Cervical spondylosis. 3. Other medical problems per primary care physician.  DISPOSITION: 1. Reinstitute OxyContin at 10 mg one p.o. q.p.m. 2. Continue other medications per Dr. Eddie Dibbles. 3. The patient was advised that if he is not improving, he may need to have    Dr. Eddie Dibbles take another  look at his shoulder and consider repeat injection if    this has not been done recently. 4. Follow up with me in four weeks. DD:  03/22/00 TD:  03/28/00 Job: [id number]

## 2011-03-09 NOTE — Discharge Summary (Signed)
Gregory [name], Gregory [name]              ACCOUNT NO.:  [id number]   MEDICAL RECORD NO.:  [id number]          PATIENT TYPE:  INP   LOCATION:  2033                         FACILITY:  [facility]   PHYSICIAN:  Champ Mungo. Lovena Le, M.D.  DATE OF BIRTH:  [birth year]   DATE OF ADMISSION:  05/11/2005  DATE OF DISCHARGE:  05/12/2005                                 DISCHARGE SUMMARY   DISCHARGE DIAGNOSES:  1.  Discharging day #1, status post implantation [employer] Maximo VR 7232 CX      cardioverter-defibrillator with defibrillator threshold study less than      or equal to 15 joules.  2.  History of myocardial infarction in February 2006 with finding of      catheterization of severe three-vessel coronary artery disease with      subsequent coronary artery bypass graft surgery x4.  3.  Ischemic cardiomyopathy, ejection fraction 15% by recent 2-D      echocardiogram.  4.  Class II congestive heart failure   SECONDARY DIAGNOSES:  1.  Borderline diabetes mellitus.  2.  Congenital loss of the right eye.  3.  Dyslipidemia.  4.  Hypothyroidism.   OPERATION/PROCEDURE:  May 11, 2005, implantation of [employer] Maximo  cardioverter-defibrillator. The patient tolerated the procedure well.  Defibrillator threshold study was undertaken with less than or equal to 15  joules.  Dr. Cristopher Peru practitioner.   DISCHARGE DISPOSITION:  Gregory [name] is discharged on day #1 after  implantation of his cardioverter-defibrillator.  He has remained afebrile.  He is also maintaining sinus rhythm.  He has had no cardiac dysrhythmia, no  respiratory distress.  He is ambulating independently.  The incision looks  good at discharge and the pain was controlled with oral analgesics.   DISCHARGE MEDICATIONS:  He was discharged on the following medications:  1.  Enteric-coated aspirin 325 mg daily.  2.  Furosemide 40 mg daily.  3.  Altace 1.25 mg daily.  4.  Amaryl 2 mg daily.  5.  Lipitor 20 mg daily.  6.  Neurontin 300  mg tablets one tablet in the morning, two tablets in the      evening.  7.  Coreg 3.125 mg at bedtime.  8.  Amiodarone 200 mg one-half tablet in the morning.  9.  Synthroid 50 mcg daily.  10. Multivitamin daily.  11. For pain management, Tylenol 325 mg one to two tablets every four to six      hours as needed.   ACTIVITY:  The patient has been given an activity sheet that describes  movement in the left upper extremity for the next week.   DIET:  Low-sodium, low-cholesterol, diabetic diet.   WOUND CARE:  He is asked to keep his incision dry for the next seven days.  Just sponge-bathe until Friday, July 28.   FOLLOW UP:  [place], [address]  ICD clinic Monday,  August 7 at 9:30 a.m.  He will see Dr. Lovena Le on Monday, August 23 at 10:45  in the morning.   BRIEF HISTORY:  Mr. Mimnaugh is a 77 year old male.  He  has ischemic  cardiomyopathy.  He had a non-Q wave myocardial infarction in February 2006.  At that time presented in overt congestive heart failure.  He underwent left  heart catheterization which demonstrated severe three-vessel coronary artery  disease with mild to moderate mitral regurgitation.  Subsequently he  underwent coronary artery bypass graft surgery.  He had recurrent heart  failure exacerbation, post CABG and initial echocardiogram showed a ejection  fraction of 25-35%.  The patient has undergone cardiac rehabilitation,  titration of his  cardiac medications and referred for followup.  The  patient has Class II congestive heart failure symptoms.  The patient can  walk without difficulty. He says that he walks for 30 minutes a day.  A  recent 2-D echocardiogram following cardiac rehabilitation in May  demonstrates an ejection fraction 15%.  He presents for cardioverter-  defibrillator for primary prevention of malignant ventricular arrhythmias.   HOSPITAL COURSE:  The patient, after understanding the risks and benefits of  the  cardioverter-defibrillator, presents electively July 21.  The  implantation was done the same day by Dr. Lovena Le.  There were no  complications.  The patient tolerated the procedure well.  His incision at  the time of discharge is healing nicely.  He is maintaining sinus rhythm. He  is afebrile. Chest x-ray has been checked and the ICD has been interrogated  by the [employer] personnel.  The patient was discharged with medications and  followup as dictated.      Soledad Gerlach   GM/MEDQ  D:  05/11/2005  T:  05/12/2005  Job:  [id number]   cc:   Darlin Coco, M.D.  [id number] [place] [address]  Fax: [phone]   Burnard Bunting, M.D.  [address]  [place]  [place] [address]  Fax: [phone]

## 2011-04-05 ENCOUNTER — Encounter: Payer: Self-pay | Admitting: Cardiology

## 2011-05-07 ENCOUNTER — Ambulatory Visit (INDEPENDENT_AMBULATORY_CARE_PROVIDER_SITE_OTHER): Payer: Medicare Other | Admitting: Cardiology

## 2011-05-07 ENCOUNTER — Encounter: Payer: Self-pay | Admitting: Cardiology

## 2011-05-07 VITALS — BP 120/60 | HR 60 | Wt 185.0 lb

## 2011-05-07 DIAGNOSIS — I259 Chronic ischemic heart disease, unspecified: Secondary | ICD-10-CM

## 2011-05-07 DIAGNOSIS — I509 Heart failure, unspecified: Secondary | ICD-10-CM

## 2011-05-07 DIAGNOSIS — Z951 Presence of aortocoronary bypass graft: Secondary | ICD-10-CM

## 2011-05-07 DIAGNOSIS — E119 Type 2 diabetes mellitus without complications: Secondary | ICD-10-CM

## 2011-05-07 NOTE — Progress Notes (Signed)
Gregory [name] Date of Birth:  Dec 20, 1932 [facility] Card[facility] / [facility] [id number] [address]   [place], [place]  [address] [phone]           Fax   [phone]  History of Present Illness: This pleasant elderly gentleman is seen for a scheduled followup office visit.  He has a history of ischemic heart disease.  He had a large anterior wall myocardial infarction in 2006 and socially underwent coronary artery bypass graft surgery.  The day of his surgery was 12/02/04.  He was left with an ischemic cardiomyopathy and had an ICD placed.  He has a past history of nonsustained VT.  He had a lead revision for his ICD in February 2009.  He has had no further shocks since 2009.  His ejection fraction by stress test on 03/29/09 was 37%.  He's not having any symptoms of overt CHF.  He is on amiodarone 100 mg a day.  Current Outpatient Prescriptions  Medication Sig Dispense Refill  . acetaminophen (TYLENOL) 500 MG tablet Take 500 mg by mouth as needed.        Marland Kitchen amiodarone (PACERONE) 200 MG tablet Take 100 mg by mouth daily.        Marland Kitchen aspirin 325 MG tablet Take 325 mg by mouth daily.        Marland Kitchen atorvastatin (LIPITOR) 20 MG tablet Take 20 mg by mouth daily.        . calcium carbonate (TUMS EX) 750 MG chewable tablet Chew 1 tablet by mouth daily.        . carvedilol (COREG) 12.5 MG tablet Take 12.5 mg by mouth 2 (two) times daily.        . Cholecalciferol (VITAMIN D-3 PO) Take by mouth daily.        . furosemide (LASIX) 40 MG tablet Take 20 mg by mouth daily.       Marland Kitchen gabapentin (NEURONTIN) 300 MG capsule Take 600 mg by mouth daily.        Marland Kitchen glimepiride (AMARYL) 2 MG tablet Take 2 mg by mouth daily.       . IRON PO Take by mouth daily.        Marland Kitchen levothyroxine (SYNTHROID, LEVOTHROID) 125 MCG tablet Take 125 mcg by mouth daily. 6 DAYS OUT OF 7 DAYS       . LUMIGAN 0.01 % SOLN       . Multiple Vitamins-Minerals (CENTRUM SILVER PO) Take by mouth daily.        . nitroGLYCERIN (NITROSTAT) 0.4 MG  SL tablet Place 0.4 mg under the tongue every 5 (five) minutes as needed.        . ramipril (ALTACE) 1.25 MG capsule       . TEMAZEPAM PO Take 30 mg by mouth at bedtime as needed.         No Known Allergies  Patient Active Problem List  Diagnoses  . HYPOTHYROIDISM  . DM  . DYSLIPIDEMIA  . DEPRESSION  . MI  . CAD  . CHF  . ANOMALY, ARM, CONGENITAL  . AUTOMATIC IMPLANTABLE CARDIAC DEFIBRILLATOR SITU  . Ischemic heart disease  . Myocardial infarction  . Hypothyroidism  . Diabetes mellitus  . Dyslipidemia  . Essential hypertension  . Ischemic cardiomyopathy  . Diabetic neuropathy    History  Smoking status  . Former Smoker  Smokeless tobacco  . Not on file    History  Alcohol Use: Not on file    No family  history on file.  Review of Systems: Constitutional: no fever chills diaphoresis or fatigue or change in weight.  Head and neck: no hearing loss, no epistaxis, no photophobia or visual disturbance. Respiratory: No cough, shortness of breath or wheezing. Cardiovascular: No chest pain peripheral edema, palpitations. Gastrointestinal: No abdominal distention, no abdominal pain, no change in bowel habits hematochezia or melena. Genitourinary: No dysuria, no frequency, no urgency, no nocturia. Musculoskeletal:No arthralgias, no back pain, no gait disturbance or myalgias. Neurological: No dizziness, no headaches, no numbness, no seizures, no syncope, no weakness, no tremors. Hematologic: No lymphadenopathy, no easy bruising. Psychiatric: No confusion, no hallucinations, no sleep disturbance.    Physical Exam: Filed Vitals:   05/07/11 1048  BP: 120/60  Pulse: 60  The general appearance reveals a well-developed well-nourished elderly gentleman in no acute distress.Pupils equal and reactive.   Extraocular Movements are full.  There is no scleral icterus.  The mouth and pharynx are normal.  The neck is supple.  The carotids reveal no bruits.  The jugular venous pressure  is normal.  The thyroid is not enlarged.  There is no lymphadenopathy.The chest is clear to percussion and auscultation. There are no rales or rhonchi. Expansion of the chest is symmetrical.  The heart reveals no murmur gallop rub or click.The abdomen is soft and nontender. Bowel sounds are normal. The liver and spleen are not enlarged. There Are no abdominal masses. There are no bruits.The pedal pulses are good.  There is no phlebitis or edema.  There is no cyanosis or clubbing.Strength is normal and symmetrical in all extremities.  There is no lateralizing weakness.  There are no sensory deficits.   Assessment / Plan: Continue same medication.  Recheck in 4 months for followup office visit and EKG.  Continue to try to walk regularly andTo lose weight

## 2011-05-07 NOTE — Assessment & Plan Note (Signed)
The patient has a history of diabetes mellitus and is on oral agents.  This is followed by Dr. Reynaldo Minium.  The patient denies any hypoglycemic episodes.  Patient states he'll be getting a complete battery of lab work next month when he has his physical with Dr. Reynaldo Minium.

## 2011-05-07 NOTE — Assessment & Plan Note (Signed)
The patient has history of ischemic heart disease.  He is status post CABG in 12/02/04.  He had a previous large anterior wall myocardial infarction.  He has an ICD in place because of an ejection fraction of 32% which has subsequently improved to 37%.  He's not had any shocks from his ICD.  He's had a past history of nonsustained ventricular tachycardia.  The patient has not been experiencing any angina pectoris.  His nitroglycerin are outdated and he will get a new fresh bottle.

## 2011-05-07 NOTE — Assessment & Plan Note (Signed)
The patient has not been having any symptoms of congestive heart failure.  He sleeps on one pillow.  He's not having any orthopnea or paroxysmal nocturnal dyspnea or increased dyspnea.  He is walking for exercise in an effort to lose weight.

## 2011-05-08 ENCOUNTER — Encounter: Payer: Self-pay | Admitting: *Deleted

## 2011-05-09 ENCOUNTER — Other Ambulatory Visit: Payer: Self-pay | Admitting: Cardiology

## 2011-05-09 MED ORDER — NITROGLYCERIN 0.4 MG SL SUBL: 0.4000 mg | SUBLINGUAL_TABLET | SUBLINGUAL | Status: DC | PRN

## 2011-05-09 NOTE — Telephone Encounter (Signed)
Sent to pharmacy 

## 2011-05-09 NOTE — Telephone Encounter (Signed)
Would like for RN to call in/escribe a new RX for his nitroglycerine to [facility]

## 2011-06-07 ENCOUNTER — Ambulatory Visit (INDEPENDENT_AMBULATORY_CARE_PROVIDER_SITE_OTHER): Payer: Medicare Other | Admitting: *Deleted

## 2011-06-07 ENCOUNTER — Other Ambulatory Visit: Payer: Self-pay | Admitting: Internal Medicine

## 2011-06-07 DIAGNOSIS — Z95810 Presence of automatic (implantable) cardiac defibrillator: Secondary | ICD-10-CM

## 2011-06-07 DIAGNOSIS — I428 Other cardiomyopathies: Secondary | ICD-10-CM

## 2011-06-20 ENCOUNTER — Other Ambulatory Visit: Payer: Self-pay | Admitting: Cardiology

## 2011-06-20 DIAGNOSIS — I119 Hypertensive heart disease without heart failure: Secondary | ICD-10-CM

## 2011-06-20 NOTE — Progress Notes (Signed)
icd remote check  

## 2011-07-04 ENCOUNTER — Encounter: Payer: Self-pay | Admitting: *Deleted

## 2011-07-13 LAB — DIFFERENTIAL
Basophils Relative: 0
Eosinophils Absolute: 0.4
Eosinophils Relative: 4
Lymphocytes Relative: 16
Lymphs Abs: 1.7
Neutro Abs: 8 — ABNORMAL HIGH

## 2011-07-13 LAB — POCT CARDIAC MARKERS
Myoglobin, poc: 320
Operator id: 284251

## 2011-07-13 LAB — CARDIAC PANEL(CRET KIN+CKTOT+MB+TROPI)
CK, MB: 3.3
Relative Index: 1.2
Troponin I: 0.61

## 2011-07-13 LAB — BASIC METABOLIC PANEL
CO2: 27
Calcium: 8.7
Chloride: 97
Creatinine, Ser: 1.45
GFR calc Af Amer: 51 — ABNORMAL LOW
GFR calc Af Amer: 58 — ABNORMAL LOW
GFR calc non Af Amer: 42 — ABNORMAL LOW
Potassium: 4
Sodium: 133 — ABNORMAL LOW
Sodium: 136

## 2011-07-13 LAB — I-STAT 8, (EC8 V) (CONVERTED LAB)
BUN: 12
Bicarbonate: 26.5 — ABNORMAL HIGH
Chloride: 103
Glucose, Bld: 248 — ABNORMAL HIGH
Hemoglobin: 15.6
Operator id: 284251
Sodium: 134 — ABNORMAL LOW
pCO2, Ven: 46.9

## 2011-07-13 LAB — PROTIME-INR
INR: 1.1
INR: 1.1
Prothrombin Time: 14.1
Prothrombin Time: 14.3

## 2011-07-13 LAB — POCT I-STAT CREATININE: Creatinine, Ser: 1.4

## 2011-07-13 LAB — CBC
HCT: 42.4
Hemoglobin: 14.6
MCHC: 34.4
RBC: 4.4

## 2011-09-05 ENCOUNTER — Ambulatory Visit (INDEPENDENT_AMBULATORY_CARE_PROVIDER_SITE_OTHER): Payer: Medicare Other | Admitting: Cardiology

## 2011-09-05 ENCOUNTER — Encounter: Payer: Self-pay | Admitting: Cardiology

## 2011-09-05 VITALS — BP 118/68 | HR 60 | Ht 66.0 in | Wt 186.0 lb

## 2011-09-05 DIAGNOSIS — I2589 Other forms of chronic ischemic heart disease: Secondary | ICD-10-CM

## 2011-09-05 DIAGNOSIS — I255 Ischemic cardiomyopathy: Secondary | ICD-10-CM

## 2011-09-05 DIAGNOSIS — E785 Hyperlipidemia, unspecified: Secondary | ICD-10-CM

## 2011-09-05 DIAGNOSIS — E119 Type 2 diabetes mellitus without complications: Secondary | ICD-10-CM

## 2011-09-05 DIAGNOSIS — I2510 Atherosclerotic heart disease of native coronary artery without angina pectoris: Secondary | ICD-10-CM

## 2011-09-05 DIAGNOSIS — I119 Hypertensive heart disease without heart failure: Secondary | ICD-10-CM

## 2011-09-05 DIAGNOSIS — E039 Hypothyroidism, unspecified: Secondary | ICD-10-CM

## 2011-09-05 NOTE — Assessment & Plan Note (Signed)
Patient has not been having any recurrent angina pectoris.  No chest pain.

## 2011-09-05 NOTE — Patient Instructions (Signed)
Your physician recommends that you continue on your current medications as directed. Please refer to the Current Medication list given to you today. Your physician recommends that you schedule a follow-up appointment in: 4 months with fasting labs (LP/BMET/HFP/CBC/TSH)

## 2011-09-05 NOTE — Assessment & Plan Note (Signed)
The patient has a history of dyslipidemia.  He is on Lipitor 20 mg daily.  He is not having any myalgias or side effect

## 2011-09-05 NOTE — Progress Notes (Signed)
Gregory [name] Date of Birth:  01/16/1933 [facility] / [facility] [id number] [address]   [place], [place]  [address] [phone]           Fax   [phone]  History of Present Illness: This very pleasant elderly gentleman is seen for a four-month followup office visit.  He has a history of ischemic heart disease with ischemic cardiomyopathy.  He also is status post CABG.  He's had a history of chronic systolic congestive heart failure.  He also has a history of diabetes mellitus for his infarct was in 2006. He has an ICD in place.  He had to have a lead revision of his ICD in February 2009.  Since then he has had no further shocks from his ICD.  She'll fraction by stress test in June 2010 was 37%.  He remains on low-dose amiodarone with no recurrent arrhythmias  Current Outpatient Prescriptions  Medication Sig Dispense Refill  . acetaminophen (TYLENOL) 500 MG tablet Take 500 mg by mouth as needed.        Marland Kitchen amiodarone (PACERONE) 200 MG tablet Take 100 mg by mouth daily.        Marland Kitchen aspirin 325 MG tablet Take 325 mg by mouth daily.        Marland Kitchen atorvastatin (LIPITOR) 20 MG tablet Take 20 mg by mouth daily.        . calcium carbonate (TUMS EX) 750 MG chewable tablet Chew 1 tablet by mouth daily.        . carvedilol (COREG) 12.5 MG tablet Take 12.5 mg by mouth 2 (two) times daily.        . Cholecalciferol (VITAMIN D-3 PO) Take by mouth daily.        . furosemide (LASIX) 40 MG tablet Take 20 mg by mouth daily.       Marland Kitchen gabapentin (NEURONTIN) 300 MG capsule Take 600 mg by mouth daily.        Marland Kitchen glimepiride (AMARYL) 2 MG tablet Take 2 mg by mouth daily.       . IRON PO Take by mouth daily. otc      . levothyroxine (SYNTHROID, LEVOTHROID) 125 MCG tablet Take 125 mcg by mouth daily.       Marland Kitchen LUMIGAN 0.01 % SOLN       . Multiple Vitamins-Minerals (CENTRUM SILVER PO) Take by mouth daily.        . nitroGLYCERIN (NITROSTAT) 0.4 MG SL tablet Place 1 tablet (0.4 mg total) under the tongue  every 5 (five) minutes as needed.  90 tablet  11  . ramipril (ALTACE) 1.25 MG capsule TAKE 1 CAPSULE BY MOUTH EVERY DAY  30 capsule  12  . TEMAZEPAM PO Take 30 mg by mouth at bedtime as needed.         No Known Allergies  Patient Active Problem List  Diagnoses  . HYPOTHYROIDISM  . DM  . DYSLIPIDEMIA  . DEPRESSION  . MI  . CAD  . CHF  . ANOMALY, ARM, CONGENITAL  . AUTOMATIC IMPLANTABLE CARDIAC DEFIBRILLATOR SITU  . Ischemic heart disease  . Myocardial infarction  . Hypothyroidism  . Diabetes mellitus  . Dyslipidemia  . Essential hypertension  . Ischemic cardiomyopathy  . Diabetic neuropathy    History  Smoking status  . Former Smoker  Smokeless tobacco  . Not on file    History  Alcohol Use: Not on file    No family history on file.  Review of Systems:  Constitutional: no fever chills diaphoresis or fatigue or change in weight.  Head and neck: no hearing loss, no epistaxis, no photophobia or visual disturbance. Respiratory: No cough, shortness of breath or wheezing. Cardiovascular: No chest pain peripheral edema, palpitations. Gastrointestinal: No abdominal distention, no abdominal pain, no change in bowel habits hematochezia or melena. Genitourinary: No dysuria, no frequency, no urgency, no nocturia. Musculoskeletal:No arthralgias, no back pain, no gait disturbance or myalgias. Neurological: No dizziness, no headaches, no numbness, no seizures, no syncope, no weakness, no tremors. Hematologic: No lymphadenopathy, no easy bruising. Psychiatric: No confusion, no hallucinations, no sleep disturbance.    Physical Exam: Filed Vitals:   09/05/11 1115  BP: 118/68  Pulse: 60  The patient appears to be in no distress.  Head and neck exam reveals that the pupils are equal and reactive.  The extraocular movements are full.  There is no scleral icterus.  Mouth and pharynx are benign.  No lymphadenopathy.  No carotid bruits.  The jugular venous pressure is normal.   Thyroid is not enlarged or tender.  Chest is clear to percussion and auscultation.  No rales or rhonchi.  Expansion of the chest is symmetrical.  Heart reveals no abnormal lift or heave.  First and second heart sounds are normal.  There is no murmur gallop rub or click.  The abdomen is soft and nontender.  Bowel sounds are normoactive.  There is no hepatosplenomegaly or mass.  There are no abdominal bruits.  Extremities reveal no phlebitis or edema.  Pedal pulses are good.  There is no cyanosis or clubbing.  Neurologic exam is normal strength and no lateralizing weakness.  No sensory deficits.  Integument reveals no rash  EKG today shows normal sinus rhythm.  He has an intraventricular conduction disturbance.  I do not see any pacer spikes.  Assessment / Plan: Continue same medication.  Recheck in 4 months for followup office visit.  Because he is on amiodarone we will also check CBC and TSH as well as fasting labs

## 2011-09-05 NOTE — Assessment & Plan Note (Signed)
The patient denies any hypoglycemic episodes

## 2011-09-06 ENCOUNTER — Other Ambulatory Visit: Payer: Self-pay | Admitting: Internal Medicine

## 2011-09-06 ENCOUNTER — Ambulatory Visit (INDEPENDENT_AMBULATORY_CARE_PROVIDER_SITE_OTHER): Payer: Medicare Other | Admitting: *Deleted

## 2011-09-06 DIAGNOSIS — Z95810 Presence of automatic (implantable) cardiac defibrillator: Secondary | ICD-10-CM

## 2011-09-06 DIAGNOSIS — I428 Other cardiomyopathies: Secondary | ICD-10-CM

## 2011-09-12 NOTE — Progress Notes (Signed)
Remote icd check  

## 2011-09-26 ENCOUNTER — Encounter: Payer: Self-pay | Admitting: *Deleted

## 2011-10-11 ENCOUNTER — Encounter: Payer: Self-pay | Admitting: Internal Medicine

## 2011-12-13 ENCOUNTER — Encounter: Payer: Self-pay | Admitting: Internal Medicine

## 2011-12-13 ENCOUNTER — Ambulatory Visit (INDEPENDENT_AMBULATORY_CARE_PROVIDER_SITE_OTHER): Payer: Medicare Other | Admitting: *Deleted

## 2011-12-13 DIAGNOSIS — I255 Ischemic cardiomyopathy: Secondary | ICD-10-CM

## 2011-12-13 DIAGNOSIS — I259 Chronic ischemic heart disease, unspecified: Secondary | ICD-10-CM

## 2011-12-13 DIAGNOSIS — Z95810 Presence of automatic (implantable) cardiac defibrillator: Secondary | ICD-10-CM

## 2011-12-13 DIAGNOSIS — I2589 Other forms of chronic ischemic heart disease: Secondary | ICD-10-CM

## 2011-12-13 DIAGNOSIS — I472 Ventricular tachycardia: Secondary | ICD-10-CM

## 2011-12-14 LAB — REMOTE ICD DEVICE
BATTERY VOLTAGE: 2.82 V
RV LEAD AMPLITUDE: 8.6 mv
TZAT-0004FASTVT: 8
TZAT-0005SLOWVT: 84 pct
TZAT-0005SLOWVT: 91 pct
TZAT-0011FASTVT: 10 ms
TZAT-0011SLOWVT: 10 ms
TZAT-0011SLOWVT: 10 ms
TZAT-0012FASTVT: 200 ms
TZAT-0013FASTVT: 1
TZAT-0013SLOWVT: 2
TZAT-0013SLOWVT: 2
TZAT-0018SLOWVT: NEGATIVE
TZAT-0018SLOWVT: NEGATIVE
TZAT-0019FASTVT: 8 V
TZON-0003FASTVT: 240 ms
TZON-0003SLOWVT: 340 ms
TZON-0004SLOWVT: 24
TZON-0005SLOWVT: 12
TZON-0008FASTVT: 0 ms
TZON-0011AFLUTTER: 70
TZST-0001FASTVT: 3
TZST-0001SLOWVT: 4
TZST-0001SLOWVT: 6
TZST-0003FASTVT: 25 J
TZST-0003FASTVT: 35 J
TZST-0003SLOWVT: 35 J
VENTRICULAR PACING ICD: 0 pct

## 2011-12-20 NOTE — Progress Notes (Signed)
Remote defib check  

## 2011-12-31 ENCOUNTER — Other Ambulatory Visit (INDEPENDENT_AMBULATORY_CARE_PROVIDER_SITE_OTHER): Payer: Medicare Other

## 2011-12-31 DIAGNOSIS — E119 Type 2 diabetes mellitus without complications: Secondary | ICD-10-CM

## 2011-12-31 DIAGNOSIS — I255 Ischemic cardiomyopathy: Secondary | ICD-10-CM

## 2011-12-31 DIAGNOSIS — E039 Hypothyroidism, unspecified: Secondary | ICD-10-CM

## 2011-12-31 DIAGNOSIS — I2589 Other forms of chronic ischemic heart disease: Secondary | ICD-10-CM

## 2011-12-31 DIAGNOSIS — I119 Hypertensive heart disease without heart failure: Secondary | ICD-10-CM

## 2011-12-31 LAB — CBC WITH DIFFERENTIAL/PLATELET
Basophils Relative: 0.4 % (ref 0.0–3.0)
Eosinophils Absolute: 0.3 10*3/uL (ref 0.0–0.7)
Eosinophils Relative: 2.9 % (ref 0.0–5.0)
Lymphocytes Relative: 30.1 % (ref 12.0–46.0)
Neutrophils Relative %: 57.9 % (ref 43.0–77.0)
Platelets: 158 10*3/uL (ref 150.0–400.0)
RBC: 3.84 Mil/uL — ABNORMAL LOW (ref 4.22–5.81)
WBC: 9.2 10*3/uL (ref 4.5–10.5)

## 2011-12-31 LAB — LIPID PANEL
Cholesterol: 140 mg/dL (ref 0–200)
Triglycerides: 343 mg/dL — ABNORMAL HIGH (ref 0.0–149.0)

## 2011-12-31 LAB — BASIC METABOLIC PANEL
Calcium: 8.9 mg/dL (ref 8.4–10.5)
GFR: 55.04 mL/min — ABNORMAL LOW (ref >60.00)
Sodium: 133 mEq/L — ABNORMAL LOW (ref 135–145)

## 2011-12-31 LAB — HEMOGLOBIN A1C: Hgb A1c MFr Bld: 9.3 % — ABNORMAL HIGH (ref 4.6–6.5)

## 2011-12-31 LAB — HEPATIC FUNCTION PANEL
ALT: 27 U/L (ref 0–53)
AST: 27 U/L (ref 0–37)
Albumin: 3.8 g/dL (ref 3.5–5.2)
Total Protein: 6.8 g/dL (ref 6.0–8.3)

## 2011-12-31 LAB — LDL CHOLESTEROL, DIRECT: Direct LDL: 57.2 mg/dL

## 2011-12-31 NOTE — Progress Notes (Signed)
Quick Note:  Please make copy of labs for patient visit. ______ 

## 2012-01-06 ENCOUNTER — Other Ambulatory Visit: Payer: Self-pay | Admitting: Cardiology

## 2012-01-07 NOTE — Telephone Encounter (Signed)
Refilled furosemide

## 2012-01-14 ENCOUNTER — Ambulatory Visit (INDEPENDENT_AMBULATORY_CARE_PROVIDER_SITE_OTHER): Payer: Medicare Other | Admitting: Cardiology

## 2012-01-14 ENCOUNTER — Encounter: Payer: Self-pay | Admitting: Cardiology

## 2012-01-14 ENCOUNTER — Encounter: Payer: Self-pay | Admitting: Internal Medicine

## 2012-01-14 ENCOUNTER — Other Ambulatory Visit: Payer: Medicare Other

## 2012-01-14 ENCOUNTER — Ambulatory Visit: Payer: Medicare Other | Admitting: Cardiology

## 2012-01-14 VITALS — BP 120/60 | HR 60 | Ht 65.0 in | Wt 186.0 lb

## 2012-01-14 DIAGNOSIS — E7800 Pure hypercholesterolemia, unspecified: Secondary | ICD-10-CM

## 2012-01-14 DIAGNOSIS — I2510 Atherosclerotic heart disease of native coronary artery without angina pectoris: Secondary | ICD-10-CM

## 2012-01-14 DIAGNOSIS — I509 Heart failure, unspecified: Secondary | ICD-10-CM

## 2012-01-14 DIAGNOSIS — Z95810 Presence of automatic (implantable) cardiac defibrillator: Secondary | ICD-10-CM

## 2012-01-14 NOTE — Patient Instructions (Signed)
Your physician wants you to follow-up in: 4 month  You will receive a reminder letter in the mail two months in advance. If you don't receive a letter, please call our office to schedule the follow-up appointment.

## 2012-01-14 NOTE — Assessment & Plan Note (Signed)
The patient has not had any symptoms of exacerbation of his chronic systolic congestive heart failure.  He is not having any paroxysmal nocturnal dyspnea or ankle edema.

## 2012-01-14 NOTE — Progress Notes (Signed)
[place] Date of Birth:  03-05-1933 [facility] [address] [place], [place]  [address] [phone]         Fax   [phone]  History of Present Illness: This pleasant 78 year old gentleman is seen for a four-month followup office visit.  He has a past history of ischemic cardiomyopathy.  He has chronic systolic congestive heart failure.  He has a history of previous coronary bypass graft surgery.  He has an ICD in place.  He has a history of diabetes mellitus.  His ejection fraction by stress test in June 2010 was 37% patient has a history of hypothyroidism.  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 Eddie Dibbles recently who gave him a cortisone shot.  Since then the patient reports that his blood sugars have been more difficult to control.  Current Outpatient Prescriptions  Medication Sig Dispense Refill  . acetaminophen (TYLENOL) 500 MG tablet Take 500 mg by mouth as needed.        Marland Kitchen amiodarone (PACERONE) 200 MG tablet Take 100 mg by mouth daily.        Marland Kitchen aspirin 325 MG tablet Take 325 mg by mouth daily.        Marland Kitchen atorvastatin (LIPITOR) 20 MG tablet Take 20 mg by mouth daily.        . calcium carbonate (TUMS EX) 750 MG chewable tablet Chew 1 tablet by mouth daily.        . carvedilol (COREG) 12.5 MG tablet Take 12.5 mg by mouth 2 (two) times daily.        . Cholecalciferol (VITAMIN D-3 PO) Take by mouth daily.        . furosemide (LASIX) 40 MG tablet TAKE 1 TABLET EVERY DAY  30 tablet  6  . gabapentin (NEURONTIN) 300 MG capsule Take 600 mg by mouth daily.        Marland Kitchen glimepiride (AMARYL) 2 MG tablet Take 2 mg by mouth 2 (two) times daily.       . IRON PO Take by mouth daily. otc      . levothyroxine (SYNTHROID, LEVOTHROID) 125 MCG tablet Take 125 mcg by mouth daily.       Marland Kitchen LUMIGAN 0.01 % SOLN       . Multiple Vitamins-Minerals (CENTRUM SILVER PO) Take by mouth daily.        . nitroGLYCERIN (NITROSTAT) 0.4 MG SL  tablet Place 1 tablet (0.4 mg total) under the tongue every 5 (five) minutes as needed.  90 tablet  11  . ramipril (ALTACE) 1.25 MG capsule TAKE 1 CAPSULE BY MOUTH EVERY DAY  30 capsule  12  . TEMAZEPAM PO Take 30 mg by mouth at bedtime as needed.         No Known Allergies  Patient Active Problem List  Diagnoses  . HYPOTHYROIDISM  . DM  . DYSLIPIDEMIA  . DEPRESSION  . MI  . CAD  . CHF  . ANOMALY, ARM, CONGENITAL  . AUTOMATIC IMPLANTABLE CARDIAC DEFIBRILLATOR SITU  . Ischemic heart disease  . Myocardial infarction  . Hypothyroidism  . Diabetes mellitus  . Dyslipidemia  . Essential hypertension  . Ischemic cardiomyopathy  . Diabetic neuropathy    History  Smoking status  . Former Smoker  Smokeless tobacco  . Not on file    History  Alcohol Use: Not on file    No family history on file.  Review of Systems: Constitutional:  no fever chills diaphoresis or fatigue or change in weight.  Head and neck: no hearing loss, no epistaxis, no photophobia or visual disturbance. Respiratory: No cough, shortness of breath or wheezing. Cardiovascular: No chest pain peripheral edema, palpitations. Gastrointestinal: No abdominal distention, no abdominal pain, no change in bowel habits hematochezia or melena. Genitourinary: No dysuria, no frequency, no urgency, no nocturia. Musculoskeletal:No arthralgias, no back pain, no gait disturbance or myalgias. Neurological: No dizziness, no headaches, no numbness, no seizures, no syncope, no weakness, no tremors. Hematologic: No lymphadenopathy, no easy bruising. Psychiatric: No confusion, no hallucinations, no sleep disturbance.    Physical Exam: Filed Vitals:   01/14/12 0936  BP: 120/60  Pulse: 60   the general appearance reveals a well-developed well-nourished gentleman in no distress.  He wears a patch over his right eye socket.The head and neck exam reveals pupils equal and reactive.  Extraocular movements are full.  There is no  scleral icterus.  The mouth and pharynx are normal.  The neck is supple.  The carotids reveal no bruits.  The jugular venous pressure is normal.  The  thyroid is not enlarged.  There is no lymphadenopathy.  The chest is clear to percussion and auscultation.  There are no rales or rhonchi.  Expansion of the chest is symmetrical.  The precordium is quiet.  The first heart sound is normal.  The second heart sound is physiologically split.  There is no murmur gallop rub or click.  There is no abnormal lift or heave.  The abdomen is soft and nontender.  The bowel sounds are normal.  The liver and spleen are not enlarged.  There are no abdominal masses.  There are no abdominal bruits.  Extremities reveal good pedal pulses.  There is no phlebitis or edema.  There is no cyanosis or clubbing.  Strength is normal and symmetrical in all extremities.  There is no lateralizing weakness.  There are no sensory deficits.  The skin is warm and dry.  There is no rash.     Assessment / Plan: We reviewed his recent bloodwork which is satisfactory except for elevation of his blood sugar and his triglycerides which are related to his recent steroid injection for arthritis of the shoulder.  The patient will continue same cardiac meds and recheck in 4 months for followup office visit and fasting lab work.  He has an appointment for an annual physical with Dr. Reynaldo Minium in the next 2 months.

## 2012-01-14 NOTE — Assessment & Plan Note (Signed)
The patient has had no further shocks from his ICD since he had his lead revision several years ago.

## 2012-01-14 NOTE — Assessment & Plan Note (Signed)
The patient has not been having any angina pectoris.  He has not had take any sublingual nitroglycerin.

## 2012-01-21 ENCOUNTER — Encounter: Payer: Self-pay | Admitting: *Deleted

## 2012-01-25 ENCOUNTER — Other Ambulatory Visit: Payer: Self-pay | Admitting: Cardiology

## 2012-02-27 ENCOUNTER — Other Ambulatory Visit: Payer: Self-pay | Admitting: Dermatology

## 2012-03-11 ENCOUNTER — Ambulatory Visit (INDEPENDENT_AMBULATORY_CARE_PROVIDER_SITE_OTHER): Payer: Medicare Other | Admitting: Internal Medicine

## 2012-03-11 ENCOUNTER — Encounter: Payer: Self-pay | Admitting: Internal Medicine

## 2012-03-11 VITALS — BP 116/58 | HR 60 | Ht 66.0 in | Wt 186.8 lb

## 2012-03-11 DIAGNOSIS — I259 Chronic ischemic heart disease, unspecified: Secondary | ICD-10-CM

## 2012-03-11 DIAGNOSIS — I2510 Atherosclerotic heart disease of native coronary artery without angina pectoris: Secondary | ICD-10-CM

## 2012-03-11 DIAGNOSIS — E785 Hyperlipidemia, unspecified: Secondary | ICD-10-CM

## 2012-03-11 DIAGNOSIS — Z95810 Presence of automatic (implantable) cardiac defibrillator: Secondary | ICD-10-CM

## 2012-03-11 LAB — ICD DEVICE OBSERVATION
DEV-0020ICD: NEGATIVE
FVT: 0
HV IMPEDENCE: 63 Ohm
PACEART VT: 0
RV LEAD THRESHOLD: 2.5 V
TZAT-0001SLOWVT: 1
TZAT-0001SLOWVT: 2
TZAT-0004FASTVT: 8
TZAT-0004SLOWVT: 8
TZAT-0004SLOWVT: 8
TZAT-0005FASTVT: 88 pct
TZAT-0005SLOWVT: 84 pct
TZAT-0005SLOWVT: 91 pct
TZAT-0011FASTVT: 10 ms
TZAT-0011SLOWVT: 10 ms
TZAT-0011SLOWVT: 10 ms
TZAT-0012FASTVT: 200 ms
TZAT-0013SLOWVT: 2
TZON-0003FASTVT: 240 ms
TZON-0004SLOWVT: 24
TZON-0005SLOWVT: 12
TZON-0008FASTVT: 0 ms
TZON-0011AFLUTTER: 70
TZST-0001FASTVT: 3
TZST-0001FASTVT: 6
TZST-0001SLOWVT: 3
TZST-0001SLOWVT: 4
TZST-0001SLOWVT: 6
TZST-0003FASTVT: 25 J
TZST-0003FASTVT: 35 J
TZST-0003FASTVT: 35 J
TZST-0003SLOWVT: 35 J
VF: 0

## 2012-03-11 NOTE — Assessment & Plan Note (Signed)
He denies anginal symptoms. He will continue his current medical therapy. I've asked the patient to increase his physical activity.

## 2012-03-11 NOTE — Assessment & Plan Note (Signed)
I've asked the patient to reduce his bed intake, lose weight, and continue his current medical therapy with Lipitor.

## 2012-03-11 NOTE — Assessment & Plan Note (Signed)
His device is working normally. We'll plan to recheck in several months. 

## 2012-03-11 NOTE — Patient Instructions (Signed)
Your physician wants you to follow-up in: 12 months with Dr Knox Saliva will receive a reminder letter in the mail two months in advance. If you don't receive a letter, please call our office to schedule the follow-up appointment.   Remote monitoring is used to monitor your Pacemaker of ICD from home. This monitoring reduces the number of office visits required to check your device to one time per year. It allows [place] to keep an eye on the functioning of your device to ensure it is working properly. You are scheduled for a device check from home on 06/12/12. You may send your transmission at any time that day. If you have a wireless device, the transmission will be sent automatically. After your physician reviews your transmission, you will receive a postcard with your next transmission date.

## 2012-03-11 NOTE — Progress Notes (Signed)
HPI Mr. Ehrman returns today for followup. He is a very pleasant 78 year old man with a history of symptomatic congestive heart failure, ventricular tachycardia, status post ICD implantation. He denies chest pain, shortness of breath, or peripheral edema. He has been walking regularly without difficulty. He denies any recent device shocks. No Known Allergies   Current Outpatient Prescriptions  Medication Sig Dispense Refill  . acetaminophen (TYLENOL) 500 MG tablet Take 500 mg by mouth as needed.        Marland Kitchen amiodarone (PACERONE) 200 MG tablet Take 100 mg by mouth daily.        Marland Kitchen aspirin 325 MG tablet Take 325 mg by mouth daily.        Marland Kitchen atorvastatin (LIPITOR) 20 MG tablet Take 20 mg by mouth daily.        . calcium carbonate (TUMS EX) 750 MG chewable tablet Chew 1 tablet by mouth daily.        . carvedilol (COREG) 12.5 MG tablet TAKE 1 TABLET BY MOUTH TWICE A DAY  60 tablet  12  . Cholecalciferol (VITAMIN D-3 PO) Take 1,000 Units by mouth daily.       . dorzolamide-timolol (COSOPT) 22.3-6.8 MG/ML ophthalmic solution Place 1 drop into the left eye 2 (two) times daily.       . furosemide (LASIX) 40 MG tablet       . gabapentin (NEURONTIN) 300 MG capsule Take 600 mg by mouth daily.        Marland Kitchen glimepiride (AMARYL) 2 MG tablet Take 2 mg by mouth 2 (two) times daily.       . IRON PO Take 65 mg by mouth daily. otc      . levothyroxine (SYNTHROID, LEVOTHROID) 125 MCG tablet Take 125 mcg by mouth daily.       Marland Kitchen LUMIGAN 0.01 % SOLN Take 1 drop by mouth at bedtime.       . Multiple Vitamins-Minerals (CENTRUM SILVER PO) Take by mouth daily.        . nitroGLYCERIN (NITROSTAT) 0.4 MG SL tablet Place 1 tablet (0.4 mg total) under the tongue every 5 (five) minutes as needed.  90 tablet  11  . ramipril (ALTACE) 1.25 MG capsule TAKE 1 CAPSULE BY MOUTH EVERY DAY  30 capsule  12  . TEMAZEPAM PO Take 30 mg by mouth at bedtime as needed.       Marland Kitchen DISCONTD: furosemide (LASIX) 40 MG tablet TAKE 1 TABLET EVERY DAY  30  tablet  6     Past Medical History  Diagnosis Date  . Ischemic heart disease   . Myocardial infarction     LARGE ANTERIOR WALL  . Hypothyroidism   . Diabetes mellitus   . Dyslipidemia   . Essential hypertension   . Ischemic cardiomyopathy   . Diabetic neuropathy     ROS:   All systems reviewed and negative except as noted in the HPI.   Past Surgical History  Procedure Date  . Coronary artery bypass graft 12/02/04  . Cardiac defibrillator placement   . Cholecystectomy, laparoscopic   . 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 Shoulder surgery     RIGHT     No family history on file.   History   Social History  . Marital Status: Married    Spouse Name: N/A    Number of Children: N/A  . Years of Education: N/A   Occupational History  . Not on file.   Social  History Main Topics  . Smoking status: Former Research scientist (life sciences)  . Smokeless tobacco: Not on file  . Alcohol Use: Not on file  . Drug Use: Not on file  . Sexually Active: Not on file   Other Topics Concern  . Not on file   Social History Narrative  . No narrative on file     BP 116/58  Pulse 60  Ht 5\' 6"  (1.676 m)  Wt 186 lb 12.8 oz (84.732 kg)  BMI 30.15 kg/m2  Physical Exam:  Well appearing 78 year old man, NAD HEENT: Unremarkable Neck:  No JVD, no thyromegally Lungs:  Clear with no wheezes, rales, or rhonchi. Well-healed device incision. HEART:  Regular rate rhythm, no murmurs, no rubs, no clicks Abd:  soft, positive bowel sounds, no organomegally, no rebound, no guarding Ext:  2 plus pulses, no edema, no cyanosis, no clubbing Skin:  No rashes no nodules Neuro:  CN II through XII intact, motor grossly intact  DEVICE  Normal device function.  See PaceArt for details.   Assess/Plan:

## 2012-06-12 ENCOUNTER — Encounter: Payer: Medicare Other | Admitting: *Deleted

## 2012-06-17 ENCOUNTER — Encounter: Payer: Self-pay | Admitting: *Deleted

## 2012-06-24 ENCOUNTER — Encounter: Payer: Self-pay | Admitting: Internal Medicine

## 2012-06-24 ENCOUNTER — Ambulatory Visit (INDEPENDENT_AMBULATORY_CARE_PROVIDER_SITE_OTHER): Payer: Medicare Other | Admitting: *Deleted

## 2012-06-24 DIAGNOSIS — Z95810 Presence of automatic (implantable) cardiac defibrillator: Secondary | ICD-10-CM

## 2012-06-24 DIAGNOSIS — I259 Chronic ischemic heart disease, unspecified: Secondary | ICD-10-CM

## 2012-06-27 LAB — REMOTE ICD DEVICE
BATTERY VOLTAGE: 2.71 V
CHARGE TIME: 11.32 s
RV LEAD IMPEDENCE ICD: 356 Ohm
TOT-0006: 20130521000000
TZAT-0004FASTVT: 8
TZAT-0004SLOWVT: 8
TZAT-0004SLOWVT: 8
TZAT-0005FASTVT: 88 pct
TZAT-0005SLOWVT: 84 pct
TZAT-0005SLOWVT: 91 pct
TZAT-0011FASTVT: 10 ms
TZAT-0011SLOWVT: 10 ms
TZAT-0011SLOWVT: 10 ms
TZAT-0012SLOWVT: 200 ms
TZAT-0012SLOWVT: 200 ms
TZAT-0013FASTVT: 1
TZAT-0013SLOWVT: 2
TZAT-0013SLOWVT: 2
TZAT-0018FASTVT: NEGATIVE
TZAT-0020SLOWVT: 1.6 ms
TZAT-0020SLOWVT: 1.6 ms
TZON-0003FASTVT: 240 ms
TZON-0003SLOWVT: 340 ms
TZON-0004SLOWVT: 24
TZON-0011AFLUTTER: 70
TZST-0001FASTVT: 3
TZST-0001FASTVT: 5
TZST-0001SLOWVT: 4
TZST-0001SLOWVT: 5
TZST-0001SLOWVT: 6
TZST-0003FASTVT: 35 J
TZST-0003FASTVT: 35 J
TZST-0003FASTVT: 35 J
TZST-0003SLOWVT: 15 J
TZST-0003SLOWVT: 35 J
TZST-0003SLOWVT: 35 J

## 2012-07-03 ENCOUNTER — Other Ambulatory Visit: Payer: Self-pay | Admitting: Cardiology

## 2012-07-03 NOTE — Telephone Encounter (Signed)
Refilled ramipril. 

## 2012-07-16 ENCOUNTER — Telehealth: Payer: Self-pay | Admitting: Internal Medicine

## 2012-07-16 ENCOUNTER — Encounter: Payer: Self-pay | Admitting: *Deleted

## 2012-07-16 NOTE — Telephone Encounter (Signed)
Pt would like results from transmission from 9-3, @ [phone]

## 2012-07-16 NOTE — Telephone Encounter (Signed)
Spoke w/pt in regards to check on 06-24-12. Next transmission 09-29-12 and pt aware.

## 2012-08-06 ENCOUNTER — Encounter: Payer: Self-pay | Admitting: Internal Medicine

## 2012-09-10 ENCOUNTER — Ambulatory Visit (AMBULATORY_SURGERY_CENTER): Payer: Medicare Other | Admitting: *Deleted

## 2012-09-10 ENCOUNTER — Encounter: Payer: Self-pay | Admitting: Internal Medicine

## 2012-09-10 VITALS — Ht 66.0 in | Wt 189.0 lb

## 2012-09-10 DIAGNOSIS — Z1211 Encounter for screening for malignant neoplasm of colon: Secondary | ICD-10-CM

## 2012-09-10 DIAGNOSIS — Z86010 Personal history of colonic polyps: Secondary | ICD-10-CM

## 2012-09-10 MED ORDER — MOVIPREP 100 G PO SOLR: ORAL | Status: DC

## 2012-09-10 NOTE — Progress Notes (Signed)
   Irene Shipper, MD - RE: direct for colonoscopy More Detail >>      RE: direct for colonoscopy      Irene Shipper, MD      Sent: Wed September 10, 2012  4:43 PM    To: Levonne Spiller, RN        LARMAR STREICH    MRN: [id number] DOB: 30-Oct-1932     Pt Home: [phone]               Message     He is appropriate for [place]. Hold diabetic meds the day of the exam. Also, fair prep last time. He needs to follow the pre-procedure dietary restrictions carefully. Thanks     ----- Message -----       From: Levonne Spiller, RN       Sent: 09/10/2012  11:34 AM         To: Irene Shipper, MD    Subject: direct for colonoscopy                                Patient direct for colonoscopy sent by Dr.Aronson(PCP) for colon polyps and for "trace of blood on stool card" per patient. Pt. Denies seeing any blood. Pt has hx of Adenomatous colon polyps. Last colon was 06-24-07, with follow-up as needed. I did not see Recall date in EPIC. Please review patient history to make sure he is OK for [place]. Thank you!! Sundra Aland

## 2012-09-26 ENCOUNTER — Telehealth: Payer: Self-pay

## 2012-09-26 NOTE — Telephone Encounter (Signed)
Message copied by Algernon Huxley on Fri Sep 26, 2012  8:52 AM ------      Message from: Irene Shipper      Created: Thu Sep 25, 2012  8:47 PM      Regarding: office appt       This patient was scheduled by pre-visit nurse for follow up colonoscopy. The anesthesia specialist was appropriately concerned about his cardiac status (low EF, last noninvasive test). Please contact him, cancel his colonoscopy 12-12, and have him make a routine OV with me to discuss. Thanks

## 2012-09-26 NOTE — Telephone Encounter (Signed)
Left message for pt to call back  °

## 2012-09-26 NOTE — Telephone Encounter (Signed)
Pt scheduled to see Dr. Henrene Pastor 10/06/12@10am . Pt aware of appt date and time. Colonoscopy appt cancelled.

## 2012-09-29 ENCOUNTER — Ambulatory Visit (INDEPENDENT_AMBULATORY_CARE_PROVIDER_SITE_OTHER): Payer: Medicare Other | Admitting: *Deleted

## 2012-09-29 DIAGNOSIS — I259 Chronic ischemic heart disease, unspecified: Secondary | ICD-10-CM

## 2012-09-29 DIAGNOSIS — Z95810 Presence of automatic (implantable) cardiac defibrillator: Secondary | ICD-10-CM

## 2012-10-02 ENCOUNTER — Encounter: Payer: Medicare Other | Admitting: Internal Medicine

## 2012-10-06 ENCOUNTER — Encounter: Payer: Self-pay | Admitting: Internal Medicine

## 2012-10-06 ENCOUNTER — Ambulatory Visit (INDEPENDENT_AMBULATORY_CARE_PROVIDER_SITE_OTHER): Payer: Medicare Other | Admitting: Internal Medicine

## 2012-10-06 VITALS — BP 124/50 | HR 68 | Ht 65.5 in | Wt 187.5 lb

## 2012-10-06 DIAGNOSIS — R195 Other fecal abnormalities: Secondary | ICD-10-CM

## 2012-10-06 DIAGNOSIS — E119 Type 2 diabetes mellitus without complications: Secondary | ICD-10-CM

## 2012-10-06 DIAGNOSIS — I255 Ischemic cardiomyopathy: Secondary | ICD-10-CM

## 2012-10-06 DIAGNOSIS — Z86010 Personal history of colonic polyps: Secondary | ICD-10-CM

## 2012-10-06 DIAGNOSIS — I2589 Other forms of chronic ischemic heart disease: Secondary | ICD-10-CM

## 2012-10-06 NOTE — Progress Notes (Signed)
HISTORY OF PRESENT ILLNESS:  Gregory [name] is a 78 y.o. male with multiple significant medical problems including ischemic cardiomyopathy status post ICD placement and pacemaker, hypothyroidism, diabetes mellitus, dyslipidemia, hypertension, prior myocardial infarction, status post coronary artery bypass grafting, and prior cholecystectomy. The patient presents today regarding Hemoccult-positive stool. He was evaluated in July 2008 for minor intermittent rectal bleeding and the need for colonoscopy. See that dictation for details. He subsequently underwent complete colonoscopy September 2008. The exam was complete with fair adequate preparation. 3 mm cecal polyp was removed and found to be adenomatous. Marked left-sided diverticulosis. Due to his age and comorbidities. Followup as needed recommended. Recent annual evaluation revealed Hemoccult-positive stool. For this, he is sent. Review of outside blood work finds a hemoglobin of 14.8 in October 2013. Patient's GI review of systems is remarkable for chronic stable constipation and intermittent hemorrhoids. He denies melena or hematochezia. He states he was having trouble with hemorrhoids around the time that his Hemoccult study was submitted. He is stable with regards to his chronic medical problems. He is active and hand Mows his lawn. Last cardiac echo in 2011 estimates the ejection fraction of 20-25%. Patient does take baby aspirin but denies other NSAIDs. He does not use PPI. He is on oral agents for diabetes.  REVIEW OF SYSTEMS:  All non-GI ROS negative except for right eye blindness  Past Medical History  Diagnosis Date  . Ischemic heart disease   . Hypothyroidism   . Diabetes mellitus   . Dyslipidemia   . Essential hypertension   . Ischemic cardiomyopathy   . Diabetic neuropathy   . Myocardial infarction 2006    LARGE ANTERIOR WALL  . Diverticulosis   . Colon polyps   . Internal hemorrhoid     Past Surgical History  Procedure Date   . Coronary artery bypass graft 12/02/04  . Cardiac defibrillator placement   . Cholecystectomy, laparoscopic   . 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 Shoulder surgery     RIGHT  . Cataract extraction     left    Social History Gregory [name]  reports that he quit smoking about 7 years ago. His smoking use included Cigarettes. He has never used smokeless tobacco. He reports that he does not drink alcohol or use illicit drugs.  family history includes Cancer in his daughter; Diabetes in his mother and unspecified family member; and Prostate cancer in his father.  There is no history of Colon cancer.  No Known Allergies     PHYSICAL EXAMINATION: Vital signs: BP 124/50  Pulse 68  Ht 5' 5.5" (1.664 m)  Wt 187 lb 8 oz (85.049 kg)  BMI 30.73 kg/m2  Constitutional: generally well-appearing, no acute distress Psychiatric: alert and oriented x3, cooperative Eyes: right eye patch. The left eye reveals extraocular movements intact, anicteric, conjunctiva pink Mouth: oral pharynx moist, no lesions Neck: supple no lymphadenopathy Cardiovascular: heart regular rate and rhythm, no murmur. Upper left chest wall with palpable defibrillator/pacer Lungs: clear to auscultation bilaterally Abdomen: soft, nontender, nondistended, no obvious ascites, no peritoneal signs, normal bowel sounds, no organomegaly Rectal:deferred until colonoscopy Extremities: no lower extremity edema bilaterally Skin: no lesions on visible extremities Neuro: No focal deficits. No asterixis.     ASSESSMENT:  #1. Asymptomatic Hemoccult-positive stool in a 78 year old with multiple significant medical problems. No anemia #2. History of adenomatous colon polyp and marked left-sided diverticulosis on colonoscopy in 2008 (fair/adequate prep) #3. Multiple significant medical problems.  However, quite stable and active   PLAN:  #1. Colonoscopy and upper endoscopy.The nature of the procedure, as  well as the risks, benefits, and alternatives were carefully and thoroughly reviewed with the patient. Ample time for discussion and questions allowed. The patient understood, was satisfied, and agreed to proceed. He wishes to wait until after the new year #2. Movi prep prescribed #3. Plan to perform at the hospital, due to comorbidities, with anesthesia supervised propofol administration #4. Hold diabetic medications the day of the procedure in order to avoid and wanted hypoglycemia

## 2012-10-06 NOTE — Patient Instructions (Addendum)
I will call you to schedule your endoscopy and colonoscopy at the hospital for the first of the year

## 2012-10-08 LAB — REMOTE ICD DEVICE
BATTERY VOLTAGE: 2.68 V
CHARGE TIME: 11.32 s
RV LEAD IMPEDENCE ICD: 360 Ohm
TOT-0006: 20130903000000
TZAT-0004FASTVT: 8
TZAT-0004SLOWVT: 8
TZAT-0005FASTVT: 88 pct
TZAT-0011FASTVT: 10 ms
TZAT-0011SLOWVT: 10 ms
TZAT-0011SLOWVT: 10 ms
TZAT-0012FASTVT: 200 ms
TZAT-0012SLOWVT: 200 ms
TZAT-0012SLOWVT: 200 ms
TZAT-0013FASTVT: 1
TZAT-0019SLOWVT: 8 V
TZAT-0019SLOWVT: 8 V
TZON-0003FASTVT: 240 ms
TZON-0003SLOWVT: 340 ms
TZON-0008SLOWVT: 0 ms
TZON-0011AFLUTTER: 70
TZST-0001FASTVT: 3
TZST-0001FASTVT: 5
TZST-0001SLOWVT: 4
TZST-0003FASTVT: 35 J
TZST-0003FASTVT: 35 J
TZST-0003FASTVT: 35 J
TZST-0003SLOWVT: 15 J
TZST-0003SLOWVT: 35 J
VENTRICULAR PACING ICD: 0 pct

## 2012-10-09 ENCOUNTER — Telehealth: Payer: Self-pay

## 2012-10-09 ENCOUNTER — Other Ambulatory Visit: Payer: Self-pay | Admitting: Internal Medicine

## 2012-10-09 DIAGNOSIS — K921 Melena: Secondary | ICD-10-CM

## 2012-10-09 NOTE — Telephone Encounter (Signed)
Message copied by Algernon Huxley on Thu Oct 09, 2012  2:23 PM ------      Message from: Irene Shipper      Created: Mon Oct 06, 2012  3:03 PM      Regarding: colonoscopy/ Endo with propofol at hospital       [facility], I saw this patient today. He needs a colonoscopy and upper endoscopy at the hospital during my next hospital week. This needs to be with anesthesia monitored propofol. He will need to come in for his prep and instructions. Also, he is diabetic, so we need to hold diabetic medications the day of the examinations. No rush at this time, you could even call him after the New year to make arrangements.

## 2012-10-09 NOTE — Telephone Encounter (Signed)
Pt aware of appt date and time

## 2012-10-09 NOTE — Telephone Encounter (Signed)
Pt scheduled for previsit for ECL 11/10/12@2pm . Pt scheduled for ECL with propofol at [facility] 12/01/12, pt to arrive at 8:45am for a 9:45am appt. Scheduled with Sharee Pimple, Case [phone]. Left message for pt to call back regarding appt dates and times.

## 2012-10-24 ENCOUNTER — Encounter: Payer: Self-pay | Admitting: *Deleted

## 2012-10-27 ENCOUNTER — Telehealth: Payer: Self-pay | Admitting: Internal Medicine

## 2012-10-27 NOTE — Telephone Encounter (Signed)
Pt states he noticed some blood on the tissue paper and wanted to know if he needed to move his appt up. Discussed with pt to keep an eye on it and let us know if it got worse. He has hemorrhoids, discussed with pt using wipes instead of toilet paper and tucks pads. Pt verbalized understanding and he will call back if no better.

## 2012-10-30 ENCOUNTER — Encounter: Payer: Self-pay | Admitting: Internal Medicine

## 2012-10-31 ENCOUNTER — Telehealth: Payer: Self-pay | Admitting: Cardiology

## 2012-10-31 NOTE — Telephone Encounter (Signed)
Requested records from [facility] and scheduled appointment for next week. Advised patient nothing recent to indicate per  Dr. Mare Ferrari and he recommended follow up ov

## 2012-10-31 NOTE — Telephone Encounter (Signed)
PER PT KIDNEY PROVIDER HIS HEART IS NOT AS STRONG AS IT USE TO BE AND HE NEEDS TO TALK ABOUT IT

## 2012-11-04 ENCOUNTER — Ambulatory Visit (INDEPENDENT_AMBULATORY_CARE_PROVIDER_SITE_OTHER): Payer: Medicare Other | Admitting: Cardiology

## 2012-11-04 ENCOUNTER — Encounter: Payer: Self-pay | Admitting: Cardiology

## 2012-11-04 VITALS — BP 132/60 | HR 64 | Resp 18 | Ht 66.0 in | Wt 186.0 lb

## 2012-11-04 DIAGNOSIS — Z95810 Presence of automatic (implantable) cardiac defibrillator: Secondary | ICD-10-CM

## 2012-11-04 DIAGNOSIS — I2510 Atherosclerotic heart disease of native coronary artery without angina pectoris: Secondary | ICD-10-CM

## 2012-11-04 DIAGNOSIS — I2589 Other forms of chronic ischemic heart disease: Secondary | ICD-10-CM

## 2012-11-04 DIAGNOSIS — I255 Ischemic cardiomyopathy: Secondary | ICD-10-CM

## 2012-11-04 DIAGNOSIS — I259 Chronic ischemic heart disease, unspecified: Secondary | ICD-10-CM

## 2012-11-04 DIAGNOSIS — I452 Bifascicular block: Secondary | ICD-10-CM

## 2012-11-04 NOTE — Assessment & Plan Note (Signed)
The patient has not been experiencing any chest pain or recurrent angina pectoris

## 2012-11-04 NOTE — Assessment & Plan Note (Signed)
The patient has had no shocks from his defibrillator

## 2012-11-04 NOTE — Progress Notes (Signed)
[place] Date of Birth:  22-Jan-1933 [facility] [address] [place], [place]  [address] [phone]         Fax   [phone]  History of Present Illness: This pleasant 79 year old gentleman is seen for a work in office visit.  We last saw him in March 2013 and he was to have returned in July but somehow that appointment never was made. He has a past history of ischemic cardiomyopathy. He has chronic systolic congestive heart failure. He has a history of previous coronary bypass graft surgery. He has an ICD in place. He has a history of diabetes mellitus. His ejection fraction by stress test in June 2010 was 37%.  His last echocardiogram done at [facility] cardiology on 07/18/10 showed an ejection fraction of 20-25% with apical akinesis and he had diastolic dysfunction and mild aortic sclerosis.  The patient is to have a colonoscopy because of trace blood in his stool on his recent physical examination.  His gastroenterologist and was concerned about his poor left ventricular function according to the patient.  Current Outpatient Prescriptions  Medication Sig Dispense Refill  . acetaminophen (TYLENOL) 500 MG tablet Take 500 mg by mouth as needed.        Marland Kitchen amiodarone (PACERONE) 200 MG tablet Take 100 mg by mouth daily.        Marland Kitchen aspirin 325 MG tablet Take 325 mg by mouth daily.        Marland Kitchen atorvastatin (LIPITOR) 20 MG tablet Take 20 mg by mouth daily.        . calcium carbonate (TUMS EX) 750 MG chewable tablet Chew 1 tablet by mouth daily.        . carvedilol (COREG) 12.5 MG tablet TAKE 1 TABLET BY MOUTH TWICE A DAY  60 tablet  12  . Cholecalciferol (VITAMIN D-3 PO) Take 1,000 Units by mouth daily.       . dorzolamide-timolol (COSOPT) 22.3-6.8 MG/ML ophthalmic solution Place 1 drop into the left eye 2 (two) times daily.       . furosemide (LASIX) 40 MG tablet Take 20 mg by mouth daily.       Marland Kitchen gabapentin (NEURONTIN) 300 MG capsule Take 600 mg by mouth 3 (three)  times daily.       Marland Kitchen glimepiride (AMARYL) 2 MG tablet Take 4 mg by mouth 2 (two) times daily.       . IRON PO Take 65 mg by mouth daily. otc      . levothyroxine (SYNTHROID, LEVOTHROID) 125 MCG tablet Take 125 mcg by mouth daily.       Marland Kitchen LUMIGAN 0.01 % SOLN Place 1 drop into the left eye at bedtime.       . metFORMIN (GLUCOPHAGE) 500 MG tablet Take 1 tablet by mouth Twice daily.      . Multiple Vitamins-Minerals (CENTRUM SILVER PO) Take by mouth daily.        . nitroGLYCERIN (NITROSTAT) 0.4 MG SL tablet Place 1 tablet (0.4 mg total) under the tongue every 5 (five) minutes as needed.  90 tablet  11  . ramipril (ALTACE) 1.25 MG capsule TAKE 1 CAPSULE BY MOUTH EVERY DAY  30 capsule  12  . TEMAZEPAM PO Take 30 mg by mouth at bedtime as needed.       . traMADol (ULTRAM) 50 MG tablet Take 1 tablet by mouth as needed.        No Known Allergies  Patient Active Problem List  Diagnosis  .  HYPOTHYROIDISM  . DM  . DYSLIPIDEMIA  . DEPRESSION  . MI  . CAD  . CHF  . ANOMALY, ARM, CONGENITAL  . AUTOMATIC IMPLANTABLE CARDIAC DEFIBRILLATOR SITU  . Ischemic heart disease  . Myocardial infarction  . Hypothyroidism  . Diabetes mellitus  . Dyslipidemia  . Essential hypertension  . Ischemic cardiomyopathy  . Diabetic neuropathy    History  Smoking status  . Former Smoker  . Types: Cigarettes  . Quit date: 10/22/2004  Smokeless tobacco  . Never Used    Comment: quit ismoking pipe/cigar n 2006    History  Alcohol Use No    Family History  Problem Relation Age of Onset  . Colon cancer Neg Hx   . Diabetes Mother   . Prostate cancer Father   . Cancer Daughter     ? unknown, in stomach  . Diabetes      Review of Systems: Constitutional: no fever chills diaphoresis or fatigue or change in weight.  Head and neck: no hearing loss, no epistaxis, no photophobia or visual disturbance. Respiratory: No cough, shortness of breath or wheezing. Cardiovascular: No chest pain peripheral edema,  palpitations. Gastrointestinal: No abdominal distention, no abdominal pain, no change in bowel habits hematochezia or melena. Genitourinary: No dysuria, no frequency, no urgency, no nocturia. Musculoskeletal:No arthralgias, no back pain, no gait disturbance or myalgias. Neurological: No dizziness, no headaches, no numbness, no seizures, no syncope, no weakness, no tremors. Hematologic: No lymphadenopathy, no easy bruising. Psychiatric: No confusion, no hallucinations, no sleep disturbance.    Physical Exam: Filed Vitals:   11/04/12 1558  BP: 132/60  Pulse: 64  Resp: 18   the general appearance reveals a pleasant elderly gentleman in no distress.  He wears an eye patch over his right eye.The head and neck exam reveals pupils equal and reactive.  Extraocular movements are full.  There is no scleral icterus.  The mouth and pharynx are normal.  The neck is supple.  The carotids reveal no bruits.  The jugular venous pressure is normal.  The  thyroid is not enlarged.  There is no lymphadenopathy.  The chest is clear to percussion and auscultation.  There are no rales or rhonchi.  Expansion of the chest is symmetrical.  The precordium is quiet.  The first heart sound is normal.  The second heart sound is physiologically split.  There is no  gallop rub or click.  There is a grade 1/6 systolic ejection murmur at the base. There is no abnormal lift or heave.  The abdomen is soft and nontender.  The bowel sounds are normal.  The liver and spleen are not enlarged.  There are no abdominal masses.  There are no abdominal bruits.  Extremities reveal good pedal pulses.  There is no phlebitis or edema.  There is no cyanosis or clubbing.  Strength is normal and symmetrical in all extremities.  There is no lateralizing weakness.  There are no sensory deficits.  The skin is warm and dry.  There is no rash.  EKG today shows normal sinus rhythm with first degree AV block.  He has a pattern of bifascicular block.  Pacer  spikes are not seen.  The tracing has not changed since 09/05/11.  His PR interval remains markedly prolonged at 416 ms.   Assessment / Plan: Clinically the patient appears to be stable.  We will update his two-dimensional echocardiogram to evaluate his left ventricular systolic function.  If satisfactory we want to forward that information to  Dr. Henrene Pastor and Dr. Reynaldo Minium so that colonoscopy can proceed in February as scheduled. Recheck here in 6 months for followup office visit and EKG.

## 2012-11-04 NOTE — Assessment & Plan Note (Signed)
The patient has not been experiencing any symptoms of congestive heart failure.  He denies any increased exertional dyspnea.  He sleeps on one pillow and does not have any paroxysmal nocturnal dyspnea.  Occasionally in the late afternoon he will have some mild pedal edema.

## 2012-11-04 NOTE — Patient Instructions (Addendum)
Your physician has requested that you have an echocardiogram. Echocardiography is a painless test that uses sound waves to create images of your heart. It provides your doctor with information about the size and shape of your heart and how well your heart's chambers and valves are working. This procedure takes approximately one hour. There are no restrictions for this procedure.   Your physician wants you to follow-up in: 6 month ov/ekg You will receive a reminder letter in the mail two months in advance. If you don't receive a letter, please call our office to schedule the follow-up appointment.

## 2012-11-05 ENCOUNTER — Other Ambulatory Visit (HOSPITAL_COMMUNITY): Payer: Self-pay | Admitting: Cardiology

## 2012-11-05 DIAGNOSIS — I2589 Other forms of chronic ischemic heart disease: Secondary | ICD-10-CM

## 2012-11-07 ENCOUNTER — Ambulatory Visit (HOSPITAL_COMMUNITY): Payer: Medicare Other | Attending: Cardiology | Admitting: Radiology

## 2012-11-07 DIAGNOSIS — I509 Heart failure, unspecified: Secondary | ICD-10-CM

## 2012-11-07 DIAGNOSIS — E785 Hyperlipidemia, unspecified: Secondary | ICD-10-CM | POA: Insufficient documentation

## 2012-11-07 DIAGNOSIS — I2589 Other forms of chronic ischemic heart disease: Secondary | ICD-10-CM | POA: Insufficient documentation

## 2012-11-07 DIAGNOSIS — E119 Type 2 diabetes mellitus without complications: Secondary | ICD-10-CM | POA: Insufficient documentation

## 2012-11-07 DIAGNOSIS — I10 Essential (primary) hypertension: Secondary | ICD-10-CM | POA: Insufficient documentation

## 2012-11-07 NOTE — Progress Notes (Signed)
Echocardiogram performed.  

## 2012-11-10 ENCOUNTER — Ambulatory Visit (AMBULATORY_SURGERY_CENTER): Payer: Medicare Other | Admitting: *Deleted

## 2012-11-10 VITALS — Ht 67.0 in | Wt 178.0 lb

## 2012-11-10 DIAGNOSIS — Z86010 Personal history of colonic polyps: Secondary | ICD-10-CM

## 2012-11-10 DIAGNOSIS — R195 Other fecal abnormalities: Secondary | ICD-10-CM

## 2012-11-10 NOTE — Progress Notes (Signed)
Pt already has Moviprep at home, no rx sent to pharmacy  Pt told to stop Iron on 11-26-12 and the not drink any fluids except for the prep the day of the procedure

## 2012-11-11 ENCOUNTER — Telehealth: Payer: Self-pay | Admitting: *Deleted

## 2012-11-11 NOTE — Telephone Encounter (Signed)
Message copied by Earvin Hansen on Tue Nov 11, 2012  8:28 AM ------      Message from: Darlin Coco      Created: Sun Nov 09, 2012  8:22 PM       Please report. LV systolic function about the same as last time.  EF 25-30%.  CSD

## 2012-11-11 NOTE — Telephone Encounter (Signed)
Advised of echo results

## 2012-11-17 ENCOUNTER — Encounter (HOSPITAL_COMMUNITY): Payer: Self-pay | Admitting: *Deleted

## 2012-11-17 NOTE — Hospital Discharge Follow-Up (Addendum)
Your procedure is scheduled [id number], December 01, 2012 Report to [place] [id number] Call this number if you have problems morning of your procedure:[phone]  Follow all bowel prep instructions per your doctor's orders.  Do not eat or drink anything after midnight the night before your procedure. You may brush your teeth, rinse out your mouth, but no water, no food, no chewing gum, no mints, no candies, no chewing tobacco.     Take these medicines the morning of your procedure with A SIP OF WATER:Carvedilol and Aminodarone and use eye drop Cosopt   Please make arrangements for a responsible person to drive you home after the procedure. You cannot go home by cab/taxi. We recommend you have someone with you at home the first 24 hours after your procedure. Driver for procedure is wife Arbie Cookey [place], [place], [place].  NO DENTURES, CONTACT LENSES ALLOWED IN THE ENDOSCOPY ROOM.   YOU MAY WEAR DEODORANT, PLEASE REMOVE ALL JEWELRY, WATCHES RINGS, BODY PIERCINGS AND LEAVE AT HOME.   WOMEN: NO MAKE-UP, LOTIONS PERFUMES   Pacemaker/ICD paperwork faxed to Dr. Cristopher Peru cardiologist [facility] 11-18-2012 for completion. 11-18-2012 Pacemaker/ICD paperwork placed in the chart,refaxed for additional area to be signed. 11-20-2012 Pacemaker/ICD paperwork placed in chart completed.

## 2012-11-26 ENCOUNTER — Encounter (HOSPITAL_COMMUNITY): Payer: Self-pay | Admitting: Pharmacy Technician

## 2012-12-01 ENCOUNTER — Encounter (HOSPITAL_COMMUNITY): Payer: Self-pay | Admitting: Anesthesiology

## 2012-12-01 ENCOUNTER — Ambulatory Visit (HOSPITAL_COMMUNITY)
Admission: RE | Admit: 2012-12-01 | Discharge: 2012-12-01 | Disposition: A | Discharge status: Home / Self Care | Payer: Medicare Other | Source: Ambulatory Visit | Attending: Internal Medicine | Admitting: Internal Medicine

## 2012-12-01 ENCOUNTER — Encounter (HOSPITAL_COMMUNITY): Payer: Self-pay

## 2012-12-01 ENCOUNTER — Ambulatory Visit (HOSPITAL_COMMUNITY): Payer: Medicare Other | Admitting: Anesthesiology

## 2012-12-01 ENCOUNTER — Encounter (HOSPITAL_COMMUNITY)
Admission: RE | Disposition: A | Discharge status: Home / Self Care | Payer: Self-pay | Source: Ambulatory Visit | Attending: Internal Medicine

## 2012-12-01 DIAGNOSIS — E119 Type 2 diabetes mellitus without complications: Secondary | ICD-10-CM | POA: Insufficient documentation

## 2012-12-01 DIAGNOSIS — E039 Hypothyroidism, unspecified: Secondary | ICD-10-CM | POA: Insufficient documentation

## 2012-12-01 DIAGNOSIS — R195 Other fecal abnormalities: Secondary | ICD-10-CM | POA: Insufficient documentation

## 2012-12-01 DIAGNOSIS — Z860100 Personal history of colon polyps, unspecified: Secondary | ICD-10-CM | POA: Insufficient documentation

## 2012-12-01 DIAGNOSIS — Z79899 Other long term (current) drug therapy: Secondary | ICD-10-CM | POA: Insufficient documentation

## 2012-12-01 DIAGNOSIS — I10 Essential (primary) hypertension: Secondary | ICD-10-CM | POA: Insufficient documentation

## 2012-12-01 DIAGNOSIS — I2510 Atherosclerotic heart disease of native coronary artery without angina pectoris: Secondary | ICD-10-CM | POA: Insufficient documentation

## 2012-12-01 DIAGNOSIS — K2960 Other gastritis without bleeding: Secondary | ICD-10-CM | POA: Insufficient documentation

## 2012-12-01 DIAGNOSIS — I252 Old myocardial infarction: Secondary | ICD-10-CM | POA: Insufficient documentation

## 2012-12-01 DIAGNOSIS — K921 Melena: Secondary | ICD-10-CM

## 2012-12-01 DIAGNOSIS — Z951 Presence of aortocoronary bypass graft: Secondary | ICD-10-CM | POA: Insufficient documentation

## 2012-12-01 DIAGNOSIS — K5730 Diverticulosis of large intestine without perforation or abscess without bleeding: Secondary | ICD-10-CM | POA: Insufficient documentation

## 2012-12-01 HISTORY — PX: ESOPHAGOGASTRODUODENOSCOPY: SHX5428

## 2012-12-01 HISTORY — PX: COLONOSCOPY: SHX5424

## 2012-12-01 HISTORY — DX: Presence of automatic (implantable) cardiac defibrillator: Z95.810

## 2012-12-01 LAB — GLUCOSE, CAPILLARY: Glucose-Capillary: 100 mg/dL — ABNORMAL HIGH (ref 70–99)

## 2012-12-01 SURGERY — EGD (ESOPHAGOGASTRODUODENOSCOPY)
Anesthesia: Monitor Anesthesia Care

## 2012-12-01 MED ORDER — LIDOCAINE HCL (CARDIAC) 20 MG/ML IV SOLN
INTRAVENOUS | Status: DC | PRN
  Administered 2012-12-01: 50 mg via INTRAVENOUS

## 2012-12-01 MED ORDER — SODIUM CHLORIDE 0.9 % IV SOLN: INTRAVENOUS | Status: DC

## 2012-12-01 MED ORDER — LACTATED RINGERS IV SOLN
INTRAVENOUS | Status: DC | PRN
  Administered 2012-12-01: 09:00:00 via INTRAVENOUS

## 2012-12-01 MED ORDER — FENTANYL CITRATE 0.05 MG/ML IJ SOLN
INTRAMUSCULAR | Status: DC | PRN
  Administered 2012-12-01 (×2): 25 ug via INTRAVENOUS

## 2012-12-01 MED ORDER — PROPOFOL 10 MG/ML IV EMUL
INTRAVENOUS | Status: DC | PRN
  Administered 2012-12-01: 75 ug/kg/min via INTRAVENOUS

## 2012-12-01 MED ORDER — PROPOFOL 10 MG/ML IV BOLUS
INTRAVENOUS | Status: DC | PRN
  Administered 2012-12-01 (×2): 20 mg via INTRAVENOUS

## 2012-12-01 MED ORDER — BUTAMBEN-TETRACAINE-BENZOCAINE 2-2-14 % EX AERO
INHALATION_SPRAY | CUTANEOUS | Status: DC | PRN
  Administered 2012-12-01: 2 via TOPICAL

## 2012-12-01 NOTE — H&P (Signed)
HISTORY OF PRESENT ILLNESS:  Gregory [name] is a 79 y.o. male with multiple significant medical problems including ischemic cardiomyopathy status post ICD placement and pacemaker, hypothyroidism, diabetes mellitus, dyslipidemia, hypertension, prior myocardial infarction, status post coronary artery bypass grafting, and prior cholecystectomy. The patient presents today regarding Hemoccult-positive stool. He was evaluated in July 2008 for minor intermittent rectal bleeding and the need for colonoscopy. See that dictation for details. He subsequently underwent complete colonoscopy September 2008. The exam was complete with fair adequate preparation. 3 mm cecal polyp was removed and found to be adenomatous. Marked left-sided diverticulosis. Due to his age and comorbidities. Followup as needed recommended. Recent annual evaluation revealed Hemoccult-positive stool. For this, he is sent. Review of outside blood work finds a hemoglobin of 14.8 in October 2013. Patient's GI review of systems is remarkable for chronic stable constipation and intermittent hemorrhoids. He denies melena or hematochezia. He states he was having trouble with hemorrhoids around the time that his Hemoccult study was submitted. He is stable with regards to his chronic medical problems. He is active and hand Mows his lawn. Last cardiac echo in 2011 estimates the ejection fraction of 20-25%. Patient does take baby aspirin but denies other NSAIDs. He does not use PPI. He is on oral agents for diabetes.  REVIEW OF SYSTEMS:  All non-GI ROS negative except for right eye blindness    Past Medical History    Diagnosis  Date    .  Ischemic heart disease     .  Hypothyroidism     .  Diabetes mellitus     .  Dyslipidemia     .  Essential hypertension     .  Ischemic cardiomyopathy     .  Diabetic neuropathy     .  Myocardial infarction  2006      LARGE ANTERIOR WALL    .  Diverticulosis     .  Colon polyps     .  Internal hemorrhoid      Past Surgical History    Procedure  Date    .  Coronary artery bypass graft  12/02/04    .  Cardiac defibrillator placement     .  Cholecystectomy, laparoscopic     .  Eye surgery       SOCKET SURGERY-BORN WITHOUT RIGHT EYE    .  Shoulder surgery       RIGHT    .  Cataract extraction       left    Social History  Mae Beauchamp Wethington reports that he quit smoking about 7 years ago. His smoking use included Cigarettes. He has never used smokeless tobacco. He reports that he does not drink alcohol or use illicit drugs.  family history includes Cancer in his daughter; Diabetes in his mother and unspecified family member; and Prostate cancer in his father. There is no history of Colon cancer.  No Known Allergies  PHYSICAL EXAMINATION:  Vital signs: BP 124/50  Pulse 68  Ht 5' 5.5" (1.664 m)  Wt 187 lb 8 oz (85.049 kg)  BMI 30.73 kg/m2  Constitutional: generally well-appearing, no acute distress  Psychiatric: alert and oriented x3, cooperative  Eyes: right eye patch. The left eye reveals extraocular movements intact, anicteric, conjunctiva pink  Mouth: oral pharynx moist, no lesions  Neck: supple no lymphadenopathy  Cardiovascular: heart regular rate and rhythm, no murmur. Upper left chest wall with palpable defibrillator/pacer  Lungs: clear to auscultation bilaterally  Abdomen: soft, nontender, nondistended,  no obvious ascites, no peritoneal signs, normal bowel sounds, no organomegaly  Rectal:deferred until colonoscopy  Extremities: no lower extremity edema bilaterally  Skin: no lesions on visible extremities  Neuro: No focal deficits. No asterixis.  ASSESSMENT:  #1. Asymptomatic Hemoccult-positive stool in a 79 year old with multiple significant medical problems. No anemia  #2. History of adenomatous colon polyp and marked left-sided diverticulosis on colonoscopy in 2008 (fair/adequate prep)  #3. Multiple significant medical problems. However, quite stable and active  PLAN:  #1.  Colonoscopy and upper endoscopy.The nature of the procedure, as well as the risks, benefits, and alternatives were carefully and thoroughly reviewed with the patient. Ample time for discussion and questions allowed. The patient understood, was satisfied, and agreed to proceed. He wishes to wait until after the new year  #2. Movi prep prescribed  #3. Plan to perform at the hospital, due to comorbidities, with anesthesia supervised propofol administration  #4. Hold diabetic medications the day of the procedure in order to avoid and wanted hypoglycemia       The patient was seen in mid December for evaluation of asymptomatic Hemoccult-positive stool as outlined above. He does have a history of adenomatous colon polyps. He has had no interval medical problems or change in his history since his most recent evaluation as outlined above. Physical exam is unchanged as well. He is stable and for colonoscopy and upper endoscopy as previously reviewed.The nature of the procedure, as well as the risks, benefits, and alternatives were carefully and thoroughly reviewed with the patient. Ample time for discussion and questions allowed. The patient understood, was satisfied, and agreed to proceed.  Docia Chuck. Geri Seminole., M.D. [facility] Division of Gastroenterology

## 2012-12-01 NOTE — Transfer of Care (Signed)
Immediate Anesthesia Transfer of Care Note  Patient: Gregory [name]  Procedure(s) Performed: Procedure(s) (LRB): ESOPHAGOGASTRODUODENOSCOPY (EGD) (N/A) COLONOSCOPY (N/A)  Patient Location: PACU  Anesthesia Type: MAC  Level of Consciousness: sedated, patient cooperative and responds to stimulaton  Airway & Oxygen Therapy: Patient Spontanous Breathing and Patient connected to face mask oxgen  Post-op Assessment: Report given to PACU RN and Post -op Vital signs reviewed and stable  Post vital signs: Reviewed and stable  Complications: No apparent anesthesia complications

## 2012-12-01 NOTE — Anesthesia Preprocedure Evaluation (Signed)
Anesthesia Evaluation  Patient identified by MRN, date of birth, ID band Patient awake    Reviewed: Allergy & Precautions, H&P , NPO status , Patient's Chart, lab work & pertinent test results  Airway  TM Distance: >3 FB Neck ROM: Full    Dental  (+) Dental Advisory Given and Teeth Intact   Pulmonary neg pulmonary ROS,  breath sounds clear to auscultation- rhonchi  Pulmonary exam normal       Cardiovascular hypertension, Pt. on medications + CAD, + Past MI and +CHF + pacemaker + Cardiac Defibrillator Rhythm:Regular Rate:Normal     Neuro/Psych PSYCHIATRIC DISORDERS Depression negative neurological ROS     GI/Hepatic negative GI ROS, Neg liver ROS,   Endo/Other  diabetes, Type 2, Oral Hypoglycemic AgentsHypothyroidism   Renal/GU negative Renal ROS     Musculoskeletal negative musculoskeletal ROS (+)   Abdominal   Peds  Hematology negative hematology ROS (+)   Anesthesia Other Findings   Reproductive/Obstetrics                           Anesthesia Physical Anesthesia Plan  ASA: III  Anesthesia Plan: MAC   Post-op Pain Management:    Induction:   Airway Management Planned: Simple Face Mask  Additional Equipment:   Intra-op Plan:   Post-operative Plan:   Informed Consent: I have reviewed the patients History and Physical, chart, labs and discussed the procedure including the risks, benefits and alternatives for the proposed anesthesia with the patient or authorized representative who has indicated his/her understanding and acceptance.   Dental advisory given  Plan Discussed with: CRNA  Anesthesia Plan Comments:         Anesthesia Quick Evaluation

## 2012-12-01 NOTE — Op Note (Signed)
[facility] [place], [address]   ENDOSCOPY PROCEDURE REPORT  PATIENT: Shriyansh, Podbielski  MR#: [id number] BIRTHDATE: [birth year] , 79  yrs. old GENDER: Male ENDOSCOPIST: Eustace Quail, MD REFERRED BY:  Burnard Bunting, M.D. PROCEDURE DATE:  12/01/2012 PROCEDURE:  EGD w/ biopsy ASA CLASS:     Class III INDICATIONS:  Heme positive stool. MEDICATIONS: MAC sedation, administered by CRNA and See Anesthesia Report. TOPICAL ANESTHETIC: Cetacaine Spray  DESCRIPTION OF PROCEDURE: After the risks benefits and alternatives of the procedure were thoroughly explained, informed consent was obtained.  The Pentax [id number] Therapeutic [id number] endoscope was introduced through the mouth and advanced to the third portion of the duodenum. Without limitations.  The instrument was slowly withdrawn as the mucosa was fully examined.      Normal esophagus.  Atrophic gastric mucosa with several superficial erosions.  CLOtest taken.  Normal duodenal bulb and post bulbar duodenum.  Retroflexed views revealed no abnormalities.     The scope was then withdrawn from the patient and the procedure completed.  COMPLICATIONS: There were no complications. ENDOSCOPIC IMPRESSION: 1. Mild gastritis 2. Otherwise normal examination  RECOMMENDATIONS: 1.  Rx CLO if positive 2.  OP follow-up is advised on a PRN basis.  REPEAT EXAM:  eSigned:  Eustace Quail, MD 12/01/2012 11:11 AM   [id number] Reynaldo Minium, MD and The Patient

## 2012-12-01 NOTE — Op Note (Signed)
[facility] [place], [address]   COLONOSCOPY PROCEDURE REPORT  PATIENT: Gregory [name], Gregory [name]  MR#: [id number] BIRTHDATE: 05-12-1933 , 79  yrs. old GENDER: Male ENDOSCOPIST: Eustace Quail, MD REFERRED [id number] Reynaldo Minium, M.D. PROCEDURE DATE:  12/01/2012 PROCEDURE:   Colonoscopy, diagnostic ASA CLASS:   Class III INDICATIONS:Patient's personal history of adenomatous colon polyps (2008) and heme-positive stool 9recent, out-pt, normal H/H). MEDICATIONS: MAC sedation, administered by CRNA and See Anesthesia Report.  DESCRIPTION OF PROCEDURE:   After the risks benefits and alternatives of the procedure were thoroughly explained, informed consent was obtained.  A digital rectal exam revealed no abnormalities of the rectum.   The Pentax Colonoscope [id number] endoscope was introduced through the anus and advanced to the cecum, which was identified by both the appendix and ileocecal valve. No adverse events experienced.   The quality of the prep was good, using MoviPrep  The instrument was then slowly withdrawn as the colon was fully examined. Image issues temorarily interupting exam (7 min)      COLON FINDINGS: Severe diverticulosis was noted The finding was in the left colon.   The colon was otherwise normal.  There was no inflammation, polyps or cancers .  Retroflexed views revealed internal hemorrhoids. The time to cecum=5 minutes 0 seconds. Withdrawal time=14 minutes 0 seconds.  The scope was withdrawn and the procedure completed. COMPLICATIONS: There were no complications.  ENDOSCOPIC IMPRESSION: 1.   Severe diverticulosis was noted in the left colon 2.   The colon was otherwise normal  RECOMMENDATIONS: 1.  Return to the care of your primary provider.  GI follow up as needed 2.  Upper endoscopy today (see report)   eSigned:  Eustace Quail, MD 12/01/2012 11:06 AM   cc: Burnard Bunting, MD and The Patient

## 2012-12-01 NOTE — Anesthesia Postprocedure Evaluation (Signed)
Anesthesia Post Note  Patient: Gregory [name]  Procedure(s) Performed: Procedure(s) (LRB): ESOPHAGOGASTRODUODENOSCOPY (EGD) (N/A) COLONOSCOPY (N/A)  Anesthesia type: MAC  Patient location: PACU  Post pain: Pain level controlled  Post assessment: Post-op Vital signs reviewed  Last Vitals: BP 136/64  Pulse 57  Temp(Src) 36.8 C (Oral)  Resp 18  SpO2 97%  Post vital signs: Reviewed  Level of consciousness: awake  Complications: No apparent anesthesia complications

## 2012-12-02 ENCOUNTER — Encounter (HOSPITAL_COMMUNITY): Payer: Self-pay | Admitting: Internal Medicine

## 2012-12-02 LAB — CLOTEST (H. PYLORI), BIOPSY: Helicobacter screen: NEGATIVE

## 2013-01-05 ENCOUNTER — Other Ambulatory Visit: Payer: Self-pay | Admitting: Internal Medicine

## 2013-01-05 ENCOUNTER — Encounter: Payer: Self-pay | Admitting: Internal Medicine

## 2013-01-05 ENCOUNTER — Ambulatory Visit (INDEPENDENT_AMBULATORY_CARE_PROVIDER_SITE_OTHER): Payer: Medicare Other | Admitting: *Deleted

## 2013-01-05 DIAGNOSIS — I259 Chronic ischemic heart disease, unspecified: Secondary | ICD-10-CM

## 2013-01-05 DIAGNOSIS — Z95810 Presence of automatic (implantable) cardiac defibrillator: Secondary | ICD-10-CM

## 2013-01-11 LAB — REMOTE ICD DEVICE
BATTERY VOLTAGE: 2.65 V
RV LEAD AMPLITUDE: 8.4 mv
TOT-0006: 20131209000000
TZAT-0001SLOWVT: 2
TZAT-0011FASTVT: 10 ms
TZAT-0013FASTVT: 1
TZAT-0019SLOWVT: 8 V
TZAT-0019SLOWVT: 8 V
TZAT-0020FASTVT: 1.6 ms
TZAT-0020SLOWVT: 1.6 ms
TZAT-0020SLOWVT: 1.6 ms
TZON-0003FASTVT: 240 ms
TZON-0008FASTVT: 0 ms
TZON-0008SLOWVT: 0 ms
TZON-0011AFLUTTER: 70
TZST-0001FASTVT: 2
TZST-0001FASTVT: 4
TZST-0001SLOWVT: 3
TZST-0001SLOWVT: 5
TZST-0003FASTVT: 35 J
TZST-0003FASTVT: 35 J
TZST-0003FASTVT: 35 J
TZST-0003SLOWVT: 15 J
TZST-0003SLOWVT: 35 J
TZST-0003SLOWVT: 35 J
VENTRICULAR PACING ICD: 0 pct

## 2013-01-16 ENCOUNTER — Encounter: Payer: Self-pay | Admitting: *Deleted

## 2013-01-26 ENCOUNTER — Other Ambulatory Visit: Payer: Self-pay | Admitting: *Deleted

## 2013-01-26 MED ORDER — CARVEDILOL 12.5 MG PO TABS: 12.5000 mg | ORAL_TABLET | Freq: Two times a day (BID) | ORAL | Status: DC

## 2013-02-17 ENCOUNTER — Other Ambulatory Visit: Payer: Self-pay | Admitting: *Deleted

## 2013-02-17 MED ORDER — FUROSEMIDE 40 MG PO TABS: 20.0000 mg | ORAL_TABLET | Freq: Every morning | ORAL | Status: DC

## 2013-03-24 ENCOUNTER — Encounter: Payer: Medicare Other | Admitting: Internal Medicine

## 2013-04-21 ENCOUNTER — Ambulatory Visit: Payer: Medicare Other | Admitting: Cardiology

## 2013-04-21 ENCOUNTER — Encounter: Payer: Self-pay | Admitting: Internal Medicine

## 2013-04-21 ENCOUNTER — Ambulatory Visit (INDEPENDENT_AMBULATORY_CARE_PROVIDER_SITE_OTHER): Payer: Medicare Other | Admitting: Internal Medicine

## 2013-04-21 VITALS — BP 111/62 | HR 58 | Ht 66.0 in | Wt 184.0 lb

## 2013-04-21 DIAGNOSIS — I259 Chronic ischemic heart disease, unspecified: Secondary | ICD-10-CM

## 2013-04-21 DIAGNOSIS — Z95810 Presence of automatic (implantable) cardiac defibrillator: Secondary | ICD-10-CM

## 2013-04-21 LAB — ICD DEVICE OBSERVATION
BRDY-0002RV: 40 {beats}/min
CHARGE TIME: 11.93 s
RV LEAD AMPLITUDE: 10.6 mv
RV LEAD IMPEDENCE ICD: 356 Ohm
RV LEAD THRESHOLD: 1 V
TZAT-0001FASTVT: 1
TZAT-0001SLOWVT: 1
TZAT-0001SLOWVT: 2
TZAT-0004SLOWVT: 8
TZAT-0004SLOWVT: 8
TZAT-0012SLOWVT: 200 ms
TZAT-0012SLOWVT: 200 ms
TZAT-0013SLOWVT: 2
TZAT-0013SLOWVT: 2
TZAT-0018FASTVT: NEGATIVE
TZAT-0018SLOWVT: NEGATIVE
TZAT-0019FASTVT: 8 V
TZAT-0019SLOWVT: 8 V
TZAT-0019SLOWVT: 8 V
TZAT-0020SLOWVT: 1.6 ms
TZAT-0020SLOWVT: 1.6 ms
TZON-0004SLOWVT: 24
TZON-0008FASTVT: 0 ms
TZON-0008SLOWVT: 0 ms
TZON-0011AFLUTTER: 70
TZST-0001FASTVT: 2
TZST-0001FASTVT: 6
TZST-0001SLOWVT: 3
TZST-0001SLOWVT: 5
TZST-0003FASTVT: 35 J
TZST-0003FASTVT: 35 J
TZST-0003FASTVT: 35 J
TZST-0003SLOWVT: 35 J
TZST-0003SLOWVT: 35 J
VF: 0

## 2013-04-21 NOTE — Patient Instructions (Addendum)
Your physician wants you to follow-up in: 12 months with Dr Knox Saliva will receive a reminder letter in the mail two months in advance. If you don't receive a letter, please call our office to schedule the follow-up appointment.  Remote monitoring is used to monitor your Pacemaker of ICD from home. This monitoring reduces the number of office visits required to check your device to one time per year. It allows [place] to keep an eye on the functioning of your device to ensure it is working properly. You are scheduled for a device check from home on 07/27/13. You may send your transmission at any time that day. If you have a wireless device, the transmission will be sent automatically. After your physician reviews your transmission, you will receive a postcard with your next transmission date.

## 2013-04-22 ENCOUNTER — Encounter: Payer: Self-pay | Admitting: Internal Medicine

## 2013-04-22 NOTE — Progress Notes (Signed)
HPI [name] returns today for followup. He is a very pleasant 79 year old man with a history of an ischemic cardiomyopathy status post myocardial infarction, chronic systolic heart failure, hypertension, and diabetes. He is status post ICD implantation. In the interim, he denies chest pain or shortness of breath. No syncope. No peripheral edema. No Known Allergies   Current Outpatient Prescriptions  Medication Sig Dispense Refill  . acetaminophen (TYLENOL) 500 MG tablet Take 500 mg by mouth every 6 (six) hours as needed. For pain      . amiodarone (PACERONE) 200 MG tablet Take 100 mg by mouth every morning.      Marland Kitchen aspirin EC 325 MG tablet Take 325 mg by mouth every morning.      Marland Kitchen atorvastatin (LIPITOR) 20 MG tablet Take 20 mg by mouth at bedtime.       . calcium carbonate (TUMS EX) 750 MG chewable tablet Chew 1 tablet by mouth daily as needed.       . carvedilol (COREG) 12.5 MG tablet Take 1 tablet (12.5 mg total) by mouth 2 (two) times daily.  60 tablet  5  . cholecalciferol (VITAMIN D) 1000 UNITS tablet Take 1,000 Units by mouth daily.      . diphenhydrAMINE (BENADRYL) 25 mg capsule Take 25 mg by mouth every morning. For allergies      . dorzolamide-timolol (COSOPT) 22.3-6.8 MG/ML ophthalmic solution Place 1 drop into the left eye daily.       . ferrous sulfate 325 (65 FE) MG tablet Take 325 mg by mouth daily with breakfast.      . furosemide (LASIX) 40 MG tablet Take 0.5 tablets (20 mg total) by mouth every morning.  30 tablet  11  . gabapentin (NEURONTIN) 300 MG capsule Take 600 mg by mouth at bedtime.       Marland Kitchen glimepiride (AMARYL) 2 MG tablet Take 4 mg by mouth 2 (two) times daily.       Marland Kitchen levothyroxine (SYNTHROID, LEVOTHROID) 125 MCG tablet Take 125 mcg by mouth every morning.       Marland Kitchen LUMIGAN 0.01 % SOLN Place 1 drop into the left eye at bedtime.       . metFORMIN (GLUCOPHAGE) 500 MG tablet Take 1 tablet by mouth 2 (two) times daily.       . Multiple Vitamins-Minerals (CENTRUM SILVER  PO) Take by mouth daily.        . nitroGLYCERIN (NITROSTAT) 0.4 MG SL tablet Place 1 tablet (0.4 mg total) under the tongue every 5 (five) minutes as needed.  90 tablet  11  . ramipril (ALTACE) 1.25 MG capsule Take 1.25 mg by mouth every morning.      . temazepam (RESTORIL) 30 MG capsule Take 30 mg by mouth at bedtime as needed. For sleep      . traMADol (ULTRAM) 50 MG tablet Take 1 tablet by mouth every 6 (six) hours as needed. For pain       No current facility-administered medications for this visit.     Past Medical History  Diagnosis Date  . Ischemic heart disease   . Hypothyroidism   . Diabetes mellitus   . Dyslipidemia   . Essential hypertension   . Ischemic cardiomyopathy   . Diabetic neuropathy   . Myocardial infarction 2006    LARGE ANTERIOR WALL  . Diverticulosis   . Colon polyps   . Internal hemorrhoid   . Allergy   . Cataract   . Glaucoma   . CHF (congestive  heart failure)   . Hyperlipidemia   . ICD (implantable cardiac defibrillator) in place 11-2004  . Pacemaker 11-2004    ROS:   All systems reviewed and negative except as noted in the HPI.   Past Surgical History  Procedure Laterality Date  . Coronary artery bypass graft  12/02/04  . Cardiac defibrillator placement    . Cholecystectomy, laparoscopic    . Eye surgery      SOCKET SURGERY-BORN WITHOUT RIGHT EYE  . Shoulder surgery      RIGHT  . Cataract extraction      left  . Cholecystectomy    . Tonsillectomy    . Esophagogastroduodenoscopy N/A 12/01/2012    Procedure: ESOPHAGOGASTRODUODENOSCOPY (EGD);  Surgeon: Irene Shipper, MD;  Location: [facility];  Service: Endoscopy;  Laterality: N/A;  . Colonoscopy N/A 12/01/2012    Procedure: COLONOSCOPY;  Surgeon: Irene Shipper, MD;  Location: [facility];  Service: Endoscopy;  Laterality: N/A;     Family History  Problem Relation Age of Onset  . Colon cancer Neg Hx   . Esophageal cancer Neg Hx   . Rectal cancer Neg Hx   . Stomach cancer Neg Hx    . Diabetes Mother   . Prostate cancer Father   . Cancer Daughter     ? unknown, in stomach  . Diabetes       History   Social History  . Marital Status: Married    Spouse Name: N/A    Number of Children: 1  . Years of Education: N/A   Occupational History  . retired    Social History Main Topics  . Smoking status: Former Smoker    Types: Cigarettes    Quit date: 10/22/2004  . Smokeless tobacco: Never Used     Comment: quit ismoking pipe/cigar n 2006  . Alcohol Use: No  . Drug Use: No  . Sexually Active: Not on file   Other Topics Concern  . Not on file   Social History Narrative  . No narrative on file     BP 111/62  Pulse 58  Ht 5\' 6"  (1.676 m)  Wt 184 lb (83.462 kg)  BMI 29.71 kg/m2  Physical Exam:  Well appearing 79 year old man,NAD HEENT: Unremarkable Neck:  7 cm JVD, no thyromegally Lungs:  Clear with no wheezes, rales, or rhonchi. HEART:  Regular rate rhythm, no murmurs, no rubs, no clicks Abd:  soft, positive bowel sounds, no organomegally, no rebound, no guarding Ext:  2 plus pulses, no edema, no cyanosis, no clubbing Skin:  No rashes no nodules Neuro:  CN II through XII intact, motor grossly intact  DEVICE  Normal device function.  See PaceArt for details.   Assess/Plan:

## 2013-04-22 NOTE — Assessment & Plan Note (Signed)
His device is working normally. We'll plan to recheck in several months. He is approaching elective replacement.

## 2013-04-22 NOTE — Assessment & Plan Note (Signed)
His blood pressure is well controlled. He'll continue his current medical therapy.

## 2013-04-22 NOTE — Assessment & Plan Note (Signed)
He denies anginal symptoms. He will continue his current medical therapy. 

## 2013-05-18 ENCOUNTER — Ambulatory Visit
Admission: RE | Admit: 2013-05-18 | Discharge: 2013-05-18 | Disposition: A | Discharge status: Home / Self Care | Payer: Medicare Other | Source: Ambulatory Visit | Attending: Cardiology | Admitting: Cardiology

## 2013-05-18 ENCOUNTER — Ambulatory Visit (INDEPENDENT_AMBULATORY_CARE_PROVIDER_SITE_OTHER): Payer: Medicare Other | Admitting: Cardiology

## 2013-05-18 ENCOUNTER — Encounter: Payer: Self-pay | Admitting: Cardiology

## 2013-05-18 VITALS — BP 122/64 | HR 60 | Ht 65.0 in | Wt 183.8 lb

## 2013-05-18 DIAGNOSIS — I259 Chronic ischemic heart disease, unspecified: Secondary | ICD-10-CM

## 2013-05-18 DIAGNOSIS — K5900 Constipation, unspecified: Secondary | ICD-10-CM | POA: Insufficient documentation

## 2013-05-18 DIAGNOSIS — E039 Hypothyroidism, unspecified: Secondary | ICD-10-CM

## 2013-05-18 DIAGNOSIS — Z79899 Other long term (current) drug therapy: Secondary | ICD-10-CM

## 2013-05-18 DIAGNOSIS — I119 Hypertensive heart disease without heart failure: Secondary | ICD-10-CM

## 2013-05-18 NOTE — Progress Notes (Signed)
[name] Date of Birth:  Jun 28, 1933 [facility] [address] [place], [place]  [address] [phone]         Fax   [phone]  History of Present Illness: This pleasant 79 year old gentleman is seen for a six-month followup office visit. Gregory [name] He has a past history of ischemic cardiomyopathy. He has chronic systolic congestive heart failure. He has a history of previous coronary bypass graft surgery. He has an ICD in place. He has a history of diabetes mellitus. His ejection fraction by stress test in June 2010 was 37%. His last echocardiogram done at [facility] cardiology on 07/18/10 showed an ejection fraction of 20-25% with apical akinesis and he had diastolic dysfunction and mild aortic sclerosis.  The patient is on long-term low-dose amiodarone because of previous ventricular tachycardia.   Current Outpatient Prescriptions  Medication Sig Dispense Refill  . acetaminophen (TYLENOL) 500 MG tablet Take 500 mg by mouth every 6 (six) hours as needed. For pain      . amiodarone (PACERONE) 200 MG tablet Take 100 mg by mouth every morning.      Gregory [name] aspirin EC 325 MG tablet Take 325 mg by mouth every morning.      Gregory [name] atorvastatin (LIPITOR) 20 MG tablet Take 20 mg by mouth at bedtime.       . calcium carbonate (TUMS EX) 750 MG chewable tablet Chew 1 tablet by mouth daily as needed.       . carvedilol (COREG) 12.5 MG tablet Take 1 tablet (12.5 mg total) by mouth 2 (two) times daily.  60 tablet  5  . cholecalciferol (VITAMIN D) 1000 UNITS tablet Take 1,000 Units by mouth daily.      . diphenhydrAMINE (BENADRYL) 25 mg capsule Take 25 mg by mouth every morning. For allergies      . dorzolamide-timolol (COSOPT) 22.3-6.8 MG/ML ophthalmic solution Place 1 drop into the left eye daily.       . ferrous sulfate 325 (65 FE) MG tablet Take 325 mg by mouth daily with breakfast.      . furosemide (LASIX) 40 MG tablet Take 0.5 tablets (20 mg total) by mouth every morning.  30 tablet   11  . gabapentin (NEURONTIN) 300 MG capsule Take 600 mg by mouth at bedtime.       Gregory [name] glimepiride (AMARYL) 2 MG tablet Take 4 mg by mouth 2 (two) times daily.       Gregory [name] levothyroxine (SYNTHROID, LEVOTHROID) 125 MCG tablet Take 125 mcg by mouth every morning.       Gregory [name] LUMIGAN 0.01 % SOLN Place 1 drop into the left eye at bedtime.       . metFORMIN (GLUCOPHAGE) 500 MG tablet Take 1 tablet by mouth 2 (two) times daily.       . Multiple Vitamins-Minerals (CENTRUM SILVER PO) Take by mouth daily.        . nitroGLYCERIN (NITROSTAT) 0.4 MG SL tablet Place 1 tablet (0.4 mg total) under the tongue every 5 (five) minutes as needed.  90 tablet  11  . ramipril (ALTACE) 1.25 MG capsule Take 1.25 mg by mouth every morning.      . temazepam (RESTORIL) 30 MG capsule Take 30 mg by mouth at bedtime as needed. For sleep      . traMADol (ULTRAM) 50 MG tablet Take 1 tablet by mouth every 6 (six) hours as needed. For pain      . Inulin (METAMUCIL CLEAR & NATURAL) POWD Take 15 mLs  by mouth daily.       No current facility-administered medications for this visit.    No Known Allergies  Patient Active Problem List   Diagnosis Date Noted  . CAD 02/02/2008    Priority: High  . AUTOMATIC IMPLANTABLE CARDIAC DEFIBRILLATOR SITU 03/15/2009    Priority: Medium  . CHF 02/02/2008    Priority: Medium  . 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 Ischemic heart disease   . Myocardial infarction   . Hypothyroidism   . Diabetes mellitus   . Dyslipidemia   . Essential hypertension   . Ischemic cardiomyopathy   . Diabetic neuropathy   . HYPOTHYROIDISM 03/15/2009  . DYSLIPIDEMIA 03/15/2009  . DM 02/02/2008  . DEPRESSION 02/02/2008  . MI 02/02/2008    History  Smoking status  . Former Smoker  . Types: Cigarettes  . Quit date: 10/22/2004  Smokeless tobacco  . Never Used    Comment: quit ismoking pipe/cigar n 2006    History  Alcohol Use No    Family History  Problem Relation Age of Onset  .  Colon cancer Neg Hx   . Esophageal cancer Neg Hx   . Rectal cancer Neg Hx   . Stomach cancer Neg Hx   . Diabetes Mother   . Prostate cancer Father   . Cancer Daughter     ? unknown, in stomach  . Diabetes      Review of Systems: Constitutional: no fever chills diaphoresis or fatigue or change in weight.  Head and neck: no hearing loss, no epistaxis, no photophobia or visual disturbance. Respiratory: No cough, shortness of breath or wheezing. Cardiovascular: No chest pain peripheral edema, palpitations. Gastrointestinal: No abdominal distention, no abdominal pain, no change in bowel habits hematochezia or melena. Genitourinary: No dysuria, no frequency, no urgency, no nocturia. Musculoskeletal:No arthralgias, no back pain, no gait disturbance or myalgias. Neurological: No dizziness, no headaches, no numbness, no seizures, no syncope, no weakness, no tremors. Hematologic: No lymphadenopathy, no easy bruising. Psychiatric: No confusion, no hallucinations, no sleep disturbance.    Physical Exam: Filed Vitals:   05/18/13 1338  BP: 122/64  Pulse: 60   the general appearance reveals a well-developed well-nourished gentleman in no distress.  He wears a patch over his right eye.The head and neck exam reveals pupils equal and reactive.  Extraocular movements are full.  There is no scleral icterus.  The mouth and pharynx are normal.  The neck is supple.  The carotids reveal no bruits.  The jugular venous pressure is normal.  The  thyroid is not enlarged.  There is no lymphadenopathy.  The chest is clear to percussion and auscultation.  There are no rales or rhonchi.  Expansion of the chest is symmetrical.  The precordium is quiet.  The first heart sound is normal.  The second heart sound is physiologically split.  There is no murmur gallop rub or click.  There is no abnormal lift or heave.  The abdomen is soft and nontender.  The bowel sounds are normal.  The liver and spleen are not enlarged.   There are no abdominal masses.  There are no abdominal bruits.  Extremities reveal good pedal pulses.  There is no phlebitis or edema.  There is no cyanosis or clubbing.  Strength is normal and symmetrical in all extremities.  There is no lateralizing weakness.  There are no sensory deficits.  The skin is warm and dry.  There is no rash.  EKG shows  normal sinus rhythm with first degree AV block and intraventricular conduction disturbance, unchanged from his previous tracing   Assessment / Plan: Continue on same medication.  We will check a chest x-ray because the patient is on long-term amiodarone.  Recheck in 6 months for followup office visit.

## 2013-05-18 NOTE — Assessment & Plan Note (Signed)
The patient has not been having symptoms of congestive heart failure.  He denies any dizziness or syncope or palpitations.

## 2013-05-18 NOTE — Patient Instructions (Addendum)
Your physician has recommended you make the following change in your medication:  1) Start metamucil 15 mL daily for constipation.  A chest x-ray takes a picture of the organs and structures inside the chest, including the heart, lungs, and blood vessels. This test can show several things, including, whether the heart is enlarges; whether fluid is building up in the lungs; and whether pacemaker / defibrillator leads are still in place.- [facility] @ [facility]  Your physician wants you to follow-up in: 6 months with Dr. Mare Ferrari. You will receive a reminder letter in the mail two months in advance. If you don't receive a letter, please call our office to schedule the follow-up appointment.

## 2013-05-18 NOTE — Assessment & Plan Note (Signed)
The patient has not been experiencing any angina pectoris.

## 2013-05-18 NOTE — Assessment & Plan Note (Signed)
Patient has a history of hypothyroidism and is followed closely for this by Dr. Reynaldo Minium.  The patient is on exogenous thyroid hormone.  The patient has been having some recent problems with constipation.  I suggested that he might want to take Metamucil 15 cc daily which he can get over-the-counter

## 2013-05-20 ENCOUNTER — Telehealth: Payer: Self-pay | Admitting: *Deleted

## 2013-05-20 NOTE — Telephone Encounter (Signed)
Advised of xray

## 2013-05-20 NOTE — Telephone Encounter (Signed)
Message copied by Earvin Hansen on Wed May 20, 2013  8:59 AM ------      Message from: Darlin Coco      Created: Mon May 18, 2013  9:32 PM       Xray stable. CSD ------

## 2013-06-03 ENCOUNTER — Telehealth: Payer: Self-pay | Admitting: Cardiology

## 2013-06-03 NOTE — Telephone Encounter (Signed)
New Problem  Pt hasn't had any bm's// Was told to get metamusal and that isnt working// advised pt to call primary care but pt requested to hear from Dr. Mare Ferrari on this issue.

## 2013-06-03 NOTE — Telephone Encounter (Signed)
Ok to try Miralax OTC, if no better contact PCP

## 2013-07-13 ENCOUNTER — Telehealth: Payer: Self-pay | Admitting: Cardiology

## 2013-07-13 NOTE — Telephone Encounter (Signed)
Answered questions regarding battery voltage 2.64 V. Device is concerned that alert sound is not loud enough for his hearing. Pt does remote checks and scheduled for 07-27-13. Pt aware of remote check.

## 2013-07-13 NOTE — Telephone Encounter (Signed)
Pt states he was informed his pacemaker battery needs to be replaced around December. He is calling with question about the pacemaker. Pt states he was informed the pacemaker would beep when the battery gets low. Pt states he doesn't know if he will hear it b/c he is HOH. Please advise

## 2013-07-26 ENCOUNTER — Other Ambulatory Visit: Payer: Self-pay | Admitting: Cardiology

## 2013-07-27 ENCOUNTER — Encounter: Payer: Medicare Other | Admitting: *Deleted

## 2013-07-29 ENCOUNTER — Ambulatory Visit (INDEPENDENT_AMBULATORY_CARE_PROVIDER_SITE_OTHER): Payer: Medicare Other | Admitting: Internal Medicine

## 2013-07-29 ENCOUNTER — Encounter: Payer: Self-pay | Admitting: Internal Medicine

## 2013-07-29 VITALS — BP 125/59 | HR 61 | Ht 66.5 in | Wt 185.8 lb

## 2013-07-29 DIAGNOSIS — Z95810 Presence of automatic (implantable) cardiac defibrillator: Secondary | ICD-10-CM

## 2013-07-29 NOTE — Assessment & Plan Note (Signed)
The patient's ICD is actually working normally. He has greater than 200 ohm impedance on his SVC coil because the patient has a single ICD. I have notified the patient of our mistake. He will not be charged for his visit.

## 2013-07-29 NOTE — Patient Instructions (Signed)
Your physician recommends that you continue on your current medications as directed. Please refer to the Current Medication list given to you today.  Keep scheduled appt with Dr. Lovena Le

## 2013-07-29 NOTE — Progress Notes (Signed)
HPI Gregory [name] returns today for followup. He is a very pleasant 79 year old man with a history of chronic systolic heart failure, an ischemic cardiomyopathy after a large anterior myocardial infarction, status post ICD implantation. In 2009, his ICD lead fracture, and he underwent insertion of a new device. He has been stable in the interim. He returns today because of concerns that he may have had a problem with his new ICD lead. Overall the patient is stable with no worsening heart there symptoms and no anginal symptoms. His heart failure is class II. No Known Allergies   Current Outpatient Prescriptions  Medication Sig Dispense Refill  . acetaminophen (TYLENOL) 500 MG tablet Take 500 mg by mouth every 6 (six) hours as needed. For pain      . amiodarone (PACERONE) 200 MG tablet Take 100 mg by mouth every morning.      Marland Kitchen aspirin EC 325 MG tablet Take 325 mg by mouth every morning.      Marland Kitchen atorvastatin (LIPITOR) 20 MG tablet Take 20 mg by mouth at bedtime.       . calcium carbonate (TUMS EX) 750 MG chewable tablet Chew 1 tablet by mouth daily as needed.       . carvedilol (COREG) 12.5 MG tablet Take 1 tablet (12.5 mg total) by mouth 2 (two) times daily.  60 tablet  5  . cholecalciferol (VITAMIN D) 1000 UNITS tablet Take 1,000 Units by mouth daily.      . diphenhydrAMINE (BENADRYL) 25 mg capsule Take 25 mg by mouth every morning. For allergies      . dorzolamide-timolol (COSOPT) 22.3-6.8 MG/ML ophthalmic solution Place 1 drop into the left eye daily.       . ferrous sulfate 325 (65 FE) MG tablet Take 325 mg by mouth daily with breakfast.      . furosemide (LASIX) 40 MG tablet Take 0.5 tablets (20 mg total) by mouth every morning.  30 tablet  11  . gabapentin (NEURONTIN) 300 MG capsule Take 600 mg by mouth at bedtime.       Marland Kitchen glimepiride (AMARYL) 2 MG tablet Take 4 mg by mouth 2 (two) times daily.       . Inulin (METAMUCIL CLEAR & NATURAL) POWD Take 15 mLs by mouth daily.      Marland Kitchen  levothyroxine (SYNTHROID, LEVOTHROID) 125 MCG tablet Take 125 mcg by mouth every morning.       Marland Kitchen LUMIGAN 0.01 % SOLN Place 1 drop into the left eye at bedtime.       . metFORMIN (GLUCOPHAGE) 500 MG tablet Take 1 tablet by mouth 2 (two) times daily.       . Multiple Vitamins-Minerals (CENTRUM SILVER PO) Take by mouth daily.        . nitroGLYCERIN (NITROSTAT) 0.4 MG SL tablet Place 1 tablet (0.4 mg total) under the tongue every 5 (five) minutes as needed.  90 tablet  11  . ramipril (ALTACE) 1.25 MG capsule TAKE 1 CAPSULE BY MOUTH EVERY DAY  30 capsule  3  . temazepam (RESTORIL) 30 MG capsule Take 30 mg by mouth at bedtime as needed. For sleep      . traMADol (ULTRAM) 50 MG tablet Take 1 tablet by mouth every 6 (six) hours as needed. For pain       No current facility-administered medications for this visit.     Past Medical History  Diagnosis Date  . Ischemic heart disease   . Hypothyroidism   .  Diabetes mellitus   . Dyslipidemia   . Essential hypertension   . Ischemic cardiomyopathy   . Diabetic neuropathy   . Myocardial infarction 2006    LARGE ANTERIOR WALL  . Diverticulosis   . Colon polyps   . Internal hemorrhoid   . Allergy   . Cataract   . Glaucoma   . CHF (congestive heart failure)   . Hyperlipidemia   . ICD (implantable cardiac defibrillator) in place 11-2004  . Pacemaker 11-2004    ROS:   All systems reviewed and negative except as noted in the HPI.   Past Surgical History  Procedure Laterality Date  . Coronary artery bypass graft  12/02/04  . Cardiac defibrillator placement    . Cholecystectomy, laparoscopic    . Eye surgery      SOCKET SURGERY-BORN WITHOUT RIGHT EYE  . Shoulder surgery      RIGHT  . Cataract extraction      left  . Cholecystectomy    . Tonsillectomy    . Esophagogastroduodenoscopy N/A 12/01/2012    Procedure: ESOPHAGOGASTRODUODENOSCOPY (EGD);  Surgeon: Irene Shipper, MD;  Location: [facility];  Service: Endoscopy;  Laterality: N/A;    . Colonoscopy N/A 12/01/2012    Procedure: COLONOSCOPY;  Surgeon: Irene Shipper, MD;  Location: [facility];  Service: Endoscopy;  Laterality: N/A;     Family History  Problem Relation Age of Onset  . Colon cancer Neg Hx   . Esophageal cancer Neg Hx   . Rectal cancer Neg Hx   . Stomach cancer Neg Hx   . Diabetes Mother   . Prostate cancer Father   . Cancer Daughter     ? unknown, in stomach  . Diabetes       History   Social History  . Marital Status: Married    Spouse Name: N/A    Number of Children: 1  . Years of Education: N/A   Occupational History  . retired    Social History Main Topics  . Smoking status: Former Smoker    Types: Cigarettes    Quit date: 10/22/2004  . Smokeless tobacco: Never Used     Comment: quit ismoking pipe/cigar n 2006  . Alcohol Use: No  . Drug Use: No  . Sexual Activity: Not on file   Other Topics Concern  . Not on file   Social History Narrative  . No narrative on file     BP 125/59  Pulse 61  Ht 5' 6.5" (1.689 m)  Wt 185 lb 12.8 oz (84.278 kg)  BMI 29.54 kg/m2  Physical Exam:  Well appearing 80 year old man, with a bandage over his right eye, NAD HEENT: Unremarkable, except for a bandage over the right eye, no underlying right eye present Neck:  No JVD, no thyromegally Back:  No CVA tenderness Lungs:  Clear with no wheezes, rales, or rhonchi. HEART:  Regular rate rhythm, no murmurs, no rubs, no clicks Abd:  soft, positive bowel sounds, no organomegally, no rebound, no guarding Ext:  2 plus pulses, no edema, no cyanosis, no clubbing Skin:  No rashes no nodules Neuro:  CN II through XII intact, motor grossly intact   DEVICE  Device interrogation demonstrates normal device function. His SVC impedance was elevated as the patient has single coil ICD with no SVC coil. For this reason, his SVC impedance red greater than 200.  Assess/Plan:

## 2013-08-06 ENCOUNTER — Encounter: Payer: Self-pay | Admitting: Internal Medicine

## 2013-08-10 ENCOUNTER — Other Ambulatory Visit: Payer: Self-pay | Admitting: Cardiology

## 2013-08-28 ENCOUNTER — Telehealth: Payer: Self-pay | Admitting: Internal Medicine

## 2013-08-28 NOTE — Telephone Encounter (Signed)
Pt concerned about battery life tone alert. He wears a hearing aid. He also does not leave his [employer] home transmitter plugged in for continuous monitoring. He only transmits when scheduled by unplugging his home phone.   I let him know he is free to send extra transmissions either before an alert to check battery status or after an alert to confirm.  His next scheduled transmission will be Jan/2015.

## 2013-08-28 NOTE — Telephone Encounter (Signed)
New Problem  Pt states that he was advised that his pacemaker battery would die in December and that the alarm would go off//  Pt request confirmation of how many times it would go off// requests a call back to discuss.

## 2013-09-15 ENCOUNTER — Other Ambulatory Visit: Payer: Self-pay | Admitting: Dermatology

## 2013-09-22 ENCOUNTER — Ambulatory Visit (INDEPENDENT_AMBULATORY_CARE_PROVIDER_SITE_OTHER): Payer: Medicare Other | Admitting: Cardiology

## 2013-09-22 ENCOUNTER — Encounter: Payer: Self-pay | Admitting: Cardiology

## 2013-09-22 VITALS — BP 100/50 | HR 60 | Ht 66.6 in | Wt 190.0 lb

## 2013-09-22 DIAGNOSIS — I259 Chronic ischemic heart disease, unspecified: Secondary | ICD-10-CM

## 2013-09-22 DIAGNOSIS — I255 Ischemic cardiomyopathy: Secondary | ICD-10-CM

## 2013-09-22 DIAGNOSIS — I2589 Other forms of chronic ischemic heart disease: Secondary | ICD-10-CM

## 2013-09-22 DIAGNOSIS — I119 Hypertensive heart disease without heart failure: Secondary | ICD-10-CM

## 2013-09-22 DIAGNOSIS — E7800 Pure hypercholesterolemia, unspecified: Secondary | ICD-10-CM

## 2013-09-22 NOTE — Assessment & Plan Note (Signed)
The patient has not had any shocks from his defibrillator.  The patient states that his defibrillator is nearing end-of-life and the battery will have to be changed out soon

## 2013-09-22 NOTE — Progress Notes (Signed)
[place] Date of Birth:  09/24/1933 [address] [place], [place]  [address] [phone]         Fax   [phone]  History of Present Illness: This pleasant 79 year old gentleman is seen for a six-month followup office visit. Gregory [name] He has a past history of ischemic cardiomyopathy. He has chronic systolic congestive heart failure. He has a history of previous coronary bypass graft surgery. He has an ICD in place. He has a history of diabetes mellitus. His ejection fraction by stress test in June 2010 was 37%. His last echocardiogram done at [facility] cardiology on 07/18/10 showed an ejection fraction of 20-25% with apical akinesis and he had diastolic dysfunction and mild aortic sclerosis.  The patient is on long-term low-dose amiodarone because of previous ventricular tachycardia.  He told me today that Dr. Kathryne Eriksson is his new PCP.  Current Outpatient Prescriptions  Medication Sig Dispense Refill  . acetaminophen (TYLENOL) 500 MG tablet Take 500 mg by mouth every 6 (six) hours as needed. For pain      . amiodarone (PACERONE) 200 MG tablet Take 100 mg by mouth every morning.      Gregory [name] aspirin EC 325 MG tablet Take 325 mg by mouth every morning.      Gregory [name] atorvastatin (LIPITOR) 20 MG tablet Take 1 tablet by mouth daily.      . calcium carbonate (TUMS EX) 750 MG chewable tablet Chew 1 tablet by mouth daily as needed.       . carvedilol (COREG) 12.5 MG tablet TAKE 1 TABLET BY MOUTH 2 TIMES DAILY.  60 tablet  5  . cholecalciferol (VITAMIN D) 1000 UNITS tablet Take 1,000 Units by mouth daily.      . diphenhydrAMINE (BENADRYL) 25 mg capsule Take 25 mg by mouth every morning. For allergies      . dorzolamide-timolol (COSOPT) 22.3-6.8 MG/ML ophthalmic solution Place 1 drop into the left eye daily.       . ferrous sulfate 325 (65 FE) MG tablet Take 325 mg by mouth daily with breakfast.      . furosemide (LASIX) 40 MG tablet Take 0.5 tablets (20 mg total) by mouth every morning.   30 tablet  11  . gabapentin (NEURONTIN) 300 MG capsule Take 600 mg by mouth at bedtime.       Gregory [name] glimepiride (AMARYL) 4 MG tablet Take 1 tablet by mouth daily.      . Inulin (METAMUCIL CLEAR & NATURAL) POWD Take 15 mLs by mouth daily.      Gregory [name] levothyroxine (SYNTHROID, LEVOTHROID) 125 MCG tablet Take 125 mcg by mouth every morning.       Gregory [name] LUMIGAN 0.01 % SOLN Place 1 drop into the left eye at bedtime.       . metFORMIN (GLUCOPHAGE) 500 MG tablet Take 1 tablet by mouth 2 (two) times daily.       . Multiple Vitamins-Minerals (CENTRUM SILVER PO) Take by mouth daily.        . nitroGLYCERIN (NITROSTAT) 0.4 MG SL tablet Place 1 tablet (0.4 mg total) under the tongue every 5 (five) minutes as needed.  90 tablet  11  . ramipril (ALTACE) 1.25 MG capsule TAKE 1 CAPSULE BY MOUTH EVERY DAY  30 capsule  3  . temazepam (RESTORIL) 30 MG capsule Take 30 mg by mouth at bedtime as needed. For sleep       No current facility-administered medications for this visit.    No Known  Allergies  Patient Active Problem List   Diagnosis Date Noted  . CAD 02/02/2008    Priority: High  . AUTOMATIC IMPLANTABLE CARDIAC DEFIBRILLATOR SITU 03/15/2009    Priority: Medium  . CHF 02/02/2008    Priority: Medium  . 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 Constipation 05/18/2013  . Ischemic heart disease   . Myocardial infarction   . Hypothyroidism   . Diabetes mellitus   . Dyslipidemia   . Benign hypertensive heart disease without heart failure   . Ischemic cardiomyopathy   . Diabetic neuropathy   . HYPOTHYROIDISM 03/15/2009  . DYSLIPIDEMIA 03/15/2009  . DM 02/02/2008  . DEPRESSION 02/02/2008  . MI 02/02/2008    History  Smoking status  . Former Smoker  . Types: Cigarettes  . Quit date: 10/22/2004  Smokeless tobacco  . Never Used    Comment: quit ismoking pipe/cigar n 2006    History  Alcohol Use No    Family History  Problem Relation Age of Onset  . Colon cancer Neg Hx   .  Esophageal cancer Neg Hx   . Rectal cancer Neg Hx   . Stomach cancer Neg Hx   . Diabetes Mother   . Prostate cancer Father   . Cancer Daughter     ? unknown, in stomach  . Diabetes      Review of Systems: Constitutional: no fever chills diaphoresis or fatigue or change in weight.  Head and neck: no hearing loss, no epistaxis, no photophobia or visual disturbance. Respiratory: No cough, shortness of breath or wheezing. Cardiovascular: No chest pain peripheral edema, palpitations. Gastrointestinal: No abdominal distention, no abdominal pain, no change in bowel habits hematochezia or melena. Genitourinary: No dysuria, no frequency, no urgency, no nocturia. Musculoskeletal:No arthralgias, no back pain, no gait disturbance or myalgias. Neurological: No dizziness, no headaches, no numbness, no seizures, no syncope, no weakness, no tremors. Hematologic: No lymphadenopathy, no easy bruising. Psychiatric: No confusion, no hallucinations, no sleep disturbance.    Physical Exam: Filed Vitals:   09/22/13 1202  BP: 100/50  Pulse: 60   the general appearance reveals a well-developed well-nourished gentleman in no distress.  He wears a patch over his right eye.The head and neck exam reveals pupils equal and reactive.  Extraocular movements are full.  There is no scleral icterus.  The mouth and pharynx are normal.  The neck is supple.  The carotids reveal no bruits.  The jugular venous pressure is normal.  The  thyroid is not enlarged.  There is no lymphadenopathy.  The chest is clear to percussion and auscultation.  There are no rales or rhonchi.  Expansion of the chest is symmetrical.  The precordium is quiet.  The first heart sound is normal.  The second heart sound is physiologically split.  There is no murmur gallop rub or click.  There is no abnormal lift or heave.  The abdomen is soft and nontender.  The bowel sounds are normal.  The liver and spleen are not enlarged.  There are no abdominal  masses.  There are no abdominal bruits.  Extremities reveal good pedal pulses.  There is no phlebitis or edema.  There is no cyanosis or clubbing.  Strength is normal and symmetrical in all extremities.  There is no lateralizing weakness.  There are no sensory deficits.  The skin is warm and dry.  There is no rash.     Assessment / Plan: Continue on same medication.  He has gained  7 pounds since last visit.  Work on weight loss.  Recheck in 6 months for office visit and EKG.

## 2013-09-22 NOTE — Assessment & Plan Note (Signed)
The patient has not been expressing any chest pain or angina.

## 2013-09-22 NOTE — Patient Instructions (Signed)
Your physician recommends that you continue on your current medications as directed. Please refer to the Current Medication list given to you today.  Your physician wants you to follow-up in: 6 month ov/ekg You will receive a reminder letter in the mail two months in advance. If you don't receive a letter, please call our office to schedule the follow-up appointment.  

## 2013-09-22 NOTE — Assessment & Plan Note (Signed)
Blood pressure today is lower than usual but the patient is not having any symptoms of dizziness or syncope.  He will continue same medication the

## 2013-10-20 ENCOUNTER — Telehealth: Payer: Self-pay | Admitting: Internal Medicine

## 2013-10-20 NOTE — Telephone Encounter (Signed)
Pt instructed to send transmission this evening or tomorrow/kwm

## 2013-10-20 NOTE — Telephone Encounter (Signed)
New Problem:  Pt is calling to find out when his battery will need to be replaced. Pt states he was told his device would alarm sometime in December and that'd mean it was time. Pt states he has not heard anything yet. Pt would like a call back.

## 2013-10-21 ENCOUNTER — Ambulatory Visit (INDEPENDENT_AMBULATORY_CARE_PROVIDER_SITE_OTHER): Payer: Medicare Other | Admitting: *Deleted

## 2013-10-21 DIAGNOSIS — I259 Chronic ischemic heart disease, unspecified: Secondary | ICD-10-CM

## 2013-10-21 DIAGNOSIS — I255 Ischemic cardiomyopathy: Secondary | ICD-10-CM

## 2013-10-21 DIAGNOSIS — I2589 Other forms of chronic ischemic heart disease: Secondary | ICD-10-CM

## 2013-10-28 LAB — MDC_IDC_ENUM_SESS_TYPE_REMOTE
Battery Voltage: 2.64 V
Date Time Interrogation Session: 20141231142300
HighPow Impedance: 60 Ohm
Lead Channel Impedance Value: 356 Ohm
Lead Channel Sensing Intrinsic Amplitude: 9.8 mV
Lead Channel Setting Pacing Amplitude: 2.5 V
Lead Channel Setting Pacing Pulse Width: 0.6 ms
Lead Channel Setting Sensing Sensitivity: 0.3 mV
Zone Setting Detection Interval: 240 ms
Zone Setting Detection Interval: 300 ms
Zone Setting Detection Interval: 340 ms

## 2013-11-05 ENCOUNTER — Encounter: Payer: Self-pay | Admitting: *Deleted

## 2013-11-11 ENCOUNTER — Encounter: Payer: Self-pay | Admitting: Internal Medicine

## 2013-11-18 ENCOUNTER — Other Ambulatory Visit: Payer: Self-pay | Admitting: Cardiology

## 2014-01-22 ENCOUNTER — Ambulatory Visit (INDEPENDENT_AMBULATORY_CARE_PROVIDER_SITE_OTHER): Payer: Medicare Other | Admitting: *Deleted

## 2014-01-22 DIAGNOSIS — I2589 Other forms of chronic ischemic heart disease: Secondary | ICD-10-CM

## 2014-01-22 DIAGNOSIS — Z95810 Presence of automatic (implantable) cardiac defibrillator: Secondary | ICD-10-CM

## 2014-01-22 DIAGNOSIS — I255 Ischemic cardiomyopathy: Secondary | ICD-10-CM

## 2014-01-22 LAB — MDC_IDC_ENUM_SESS_TYPE_REMOTE
Battery Voltage: 2.62 V
Brady Statistic RV Percent Paced: 0 %
Date Time Interrogation Session: 20150403145800
HIGH POWER IMPEDANCE MEASURED VALUE: 60 Ohm
MDC IDC MSMT LEADCHNL RV IMPEDANCE VALUE: 348 Ohm
MDC IDC MSMT LEADCHNL RV SENSING INTR AMPL: 8 mV
MDC IDC SET LEADCHNL RV PACING AMPLITUDE: 2.5 V
MDC IDC SET LEADCHNL RV PACING PULSEWIDTH: 0.6 ms
MDC IDC SET LEADCHNL RV SENSING SENSITIVITY: 0.3 mV
MDC IDC SET ZONE DETECTION INTERVAL: 240 ms
Zone Setting Detection Interval: 300 ms
Zone Setting Detection Interval: 340 ms

## 2014-02-04 ENCOUNTER — Ambulatory Visit (INDEPENDENT_AMBULATORY_CARE_PROVIDER_SITE_OTHER): Payer: Medicare Other | Admitting: Internal Medicine

## 2014-02-04 ENCOUNTER — Encounter: Payer: Self-pay | Admitting: Internal Medicine

## 2014-02-04 VITALS — BP 136/56 | HR 58 | Ht 65.0 in | Wt 191.0 lb

## 2014-02-04 DIAGNOSIS — I255 Ischemic cardiomyopathy: Secondary | ICD-10-CM

## 2014-02-04 DIAGNOSIS — I2589 Other forms of chronic ischemic heart disease: Secondary | ICD-10-CM

## 2014-02-04 DIAGNOSIS — Z95810 Presence of automatic (implantable) cardiac defibrillator: Secondary | ICD-10-CM

## 2014-02-04 NOTE — Patient Instructions (Signed)
Your physician has requested that you have an echocardiogram. Echocardiography is a painless test that uses sound waves to create images of your heart. It provides your doctor with information about the size and shape of your heart and how well your heart's chambers and valves are working. This procedure takes approximately one hour. There are no restrictions for this procedure.   Will schedule a generator change once echo is complete

## 2014-02-04 NOTE — Assessment & Plan Note (Signed)
His medtronic device has reached ERI. I have asked the patient to undergo 2D echo and if EF is equal to or under 35%, undergo ICD generator removal and replacement of his old device which appears to be working normally.

## 2014-02-04 NOTE — Assessment & Plan Note (Addendum)
He denies anginal symptoms. He will continue his current meds. Previously his EF was 25%, and I would be surprised if it has improved. No change in medical therapy.

## 2014-02-04 NOTE — Progress Notes (Signed)
HPI Gregory [name] returns today for followup. He is a very pleasant 80 year old man with a history of chronic systolic heart failure, an ischemic cardiomyopathy after a large anterior myocardial infarction, status post ICD implantation. In 2009, his ICD lead fracture, and he underwent insertion of a new device. He has been stable in the interim. He returns today and is noted to have reached ERI on his device. Overall the patient is stable with no worsening heart there symptoms and no anginal symptoms. His heart failure is class II. No Known Allergies   Current Outpatient Prescriptions  Medication Sig Dispense Refill  . acetaminophen (TYLENOL) 500 MG tablet Take 500 mg by mouth every 6 (six) hours as needed. For pain      . amiodarone (PACERONE) 200 MG tablet Take 100 mg by mouth every morning.      Marland Kitchen aspirin EC 325 MG tablet Take 325 mg by mouth every morning.      Marland Kitchen atorvastatin (LIPITOR) 20 MG tablet Take 1 tablet by mouth daily.      . calcium carbonate (TUMS EX) 750 MG chewable tablet Chew 1 tablet by mouth daily as needed.       . carvedilol (COREG) 12.5 MG tablet TAKE 1 TABLET BY MOUTH 2 TIMES DAILY.  60 tablet  5  . cholecalciferol (VITAMIN D) 1000 UNITS tablet Take 1,000 Units by mouth daily.      . diphenhydrAMINE (BENADRYL) 25 mg capsule Take 25 mg by mouth every 6 (six) hours as needed. For allergies      . dorzolamide-timolol (COSOPT) 22.3-6.8 MG/ML ophthalmic solution Place 1 drop into the left eye daily.       . ferrous sulfate 325 (65 FE) MG tablet Take 325 mg by mouth daily with breakfast.      . furosemide (LASIX) 40 MG tablet Take 0.5 tablets (20 mg total) by mouth every morning.  30 tablet  11  . gabapentin (NEURONTIN) 300 MG capsule Take 600 mg by mouth at bedtime.       Marland Kitchen glimepiride (AMARYL) 4 MG tablet Take 1 tablet by mouth 2 (two) times daily.       . Inulin (METAMUCIL CLEAR & NATURAL) POWD Take 15 mLs by mouth daily.      Marland Kitchen levothyroxine (SYNTHROID, LEVOTHROID)  125 MCG tablet Take 125 mcg by mouth every morning.       Marland Kitchen LUMIGAN 0.01 % SOLN Place 1 drop into the left eye at bedtime.       . metFORMIN (GLUCOPHAGE) 500 MG tablet Take 1 tablet by mouth 2 (two) times daily.       . Multiple Vitamins-Minerals (CENTRUM SILVER PO) Take by mouth daily.        . nitroGLYCERIN (NITROSTAT) 0.4 MG SL tablet Place 1 tablet (0.4 mg total) under the tongue every 5 (five) minutes as needed.  90 tablet  11  . ramipril (ALTACE) 1.25 MG capsule TAKE 1 CAPSULE BY MOUTH EVERY DAY  30 capsule  3  . tamsulosin (FLOMAX) 0.4 MG CAPS capsule daily.      . temazepam (RESTORIL) 30 MG capsule Take 30 mg by mouth at bedtime as needed. For sleep       No current facility-administered medications for this visit.     Past Medical History  Diagnosis Date  . Ischemic heart disease   . Hypothyroidism   . Diabetes mellitus   . Dyslipidemia   . Essential hypertension   . Ischemic cardiomyopathy   .  Diabetic neuropathy   . Myocardial infarction 2006    LARGE ANTERIOR WALL  . Diverticulosis   . Colon polyps   . Internal hemorrhoid   . Allergy   . Cataract   . Glaucoma   . CHF (congestive heart failure)   . Hyperlipidemia   . ICD (implantable cardiac defibrillator) in place 11-2004  . Pacemaker 11-2004    ROS:   All systems reviewed and negative except as noted in the HPI.   Past Surgical History  Procedure Laterality Date  . Coronary artery bypass graft  12/02/04  . Cardiac defibrillator placement    . Cholecystectomy, laparoscopic    . Eye surgery      SOCKET SURGERY-BORN WITHOUT RIGHT EYE  . Shoulder surgery      RIGHT  . Cataract extraction      left  . Cholecystectomy    . Tonsillectomy    . Esophagogastroduodenoscopy N/A 12/01/2012    Procedure: ESOPHAGOGASTRODUODENOSCOPY (EGD);  Surgeon: Irene Shipper, MD;  Location: [facility];  Service: Endoscopy;  Laterality: N/A;  . Colonoscopy N/A 12/01/2012    Procedure: COLONOSCOPY;  Surgeon: Irene Shipper, MD;   Location: [facility];  Service: Endoscopy;  Laterality: N/A;     Family History  Problem Relation Age of Onset  . Colon cancer Neg Hx   . Esophageal cancer Neg Hx   . Rectal cancer Neg Hx   . Stomach cancer Neg Hx   . Diabetes Mother   . Prostate cancer Father   . Cancer Daughter     ? unknown, in stomach  . Diabetes       History   Social History  . Marital Status: Married    Spouse Name: N/A    Number of Children: 1  . Years of Education: N/A   Occupational History  . retired    Social History Main Topics  . Smoking status: Former Smoker    Types: Cigarettes    Quit date: 10/22/2004  . Smokeless tobacco: Never Used     Comment: quit ismoking pipe/cigar n 2006  . Alcohol Use: No  . Drug Use: No  . Sexual Activity: Not on file   Other Topics Concern  . Not on file   Social History Narrative  . No narrative on file     BP 136/56  Pulse 58  Ht 5\' 5"  (1.651 m)  Wt 191 lb (86.637 kg)  BMI 31.78 kg/m2  Physical Exam:  Well appearing 80 year old man, with a bandage over his right eye, NAD HEENT: Unremarkable, except for a bandage over the right eye, no underlying right eye present Neck:  No JVD, no thyromegally Back:  No CVA tenderness Lungs:  Clear with no wheezes, rales, or rhonchi. HEART:  Regular rate rhythm, no murmurs, no rubs, no clicks Abd:  soft, positive bowel sounds, no organomegally, no rebound, no guarding Ext:  2 plus pulses, no edema, no cyanosis, no clubbing Skin:  No rashes no nodules Neuro:  CN II through XII intact, motor grossly intact   DEVICE  Device interrogation demonstrates normal device function.   Assess/Plan:

## 2014-02-16 ENCOUNTER — Ambulatory Visit (HOSPITAL_COMMUNITY): Payer: Medicare Other | Attending: Cardiovascular Disease | Admitting: Radiology

## 2014-02-16 ENCOUNTER — Encounter: Payer: Self-pay | Admitting: Internal Medicine

## 2014-02-16 ENCOUNTER — Encounter: Payer: Self-pay | Admitting: Cardiovascular Disease

## 2014-02-16 DIAGNOSIS — I255 Ischemic cardiomyopathy: Secondary | ICD-10-CM

## 2014-02-16 DIAGNOSIS — I509 Heart failure, unspecified: Secondary | ICD-10-CM

## 2014-02-16 DIAGNOSIS — I2589 Other forms of chronic ischemic heart disease: Secondary | ICD-10-CM

## 2014-02-16 NOTE — Progress Notes (Signed)
Echocardiogram performed.  

## 2014-02-23 ENCOUNTER — Other Ambulatory Visit: Payer: Self-pay | Admitting: *Deleted

## 2014-02-23 MED ORDER — FUROSEMIDE 40 MG PO TABS: 20.0000 mg | ORAL_TABLET | Freq: Every morning | ORAL | Status: DC

## 2014-02-25 ENCOUNTER — Other Ambulatory Visit: Payer: Self-pay

## 2014-02-25 MED ORDER — CARVEDILOL 12.5 MG PO TABS: ORAL_TABLET | ORAL | Status: DC

## 2014-03-01 ENCOUNTER — Telehealth: Payer: Self-pay | Admitting: Internal Medicine

## 2014-03-01 NOTE — Telephone Encounter (Signed)
New Message  Pt called states that he has received a letter in the mail for a Phy Pacer appt// Pt states that he is scheduled to have his Pacer changes soon. He requests a call back to determine if the phy pacer appt is needed. Please assist.

## 2014-03-07 ENCOUNTER — Other Ambulatory Visit: Payer: Self-pay | Admitting: *Deleted

## 2014-03-07 ENCOUNTER — Encounter: Payer: Self-pay | Admitting: *Deleted

## 2014-03-07 DIAGNOSIS — Z95810 Presence of automatic (implantable) cardiac defibrillator: Secondary | ICD-10-CM

## 2014-03-07 DIAGNOSIS — I255 Ischemic cardiomyopathy: Secondary | ICD-10-CM

## 2014-03-08 ENCOUNTER — Encounter (HOSPITAL_COMMUNITY): Payer: Self-pay | Admitting: Pharmacy Technician

## 2014-03-08 ENCOUNTER — Other Ambulatory Visit (INDEPENDENT_AMBULATORY_CARE_PROVIDER_SITE_OTHER): Payer: Medicare Other

## 2014-03-08 DIAGNOSIS — Z95810 Presence of automatic (implantable) cardiac defibrillator: Secondary | ICD-10-CM

## 2014-03-08 DIAGNOSIS — I255 Ischemic cardiomyopathy: Secondary | ICD-10-CM

## 2014-03-08 DIAGNOSIS — I2589 Other forms of chronic ischemic heart disease: Secondary | ICD-10-CM

## 2014-03-08 LAB — CBC WITH DIFFERENTIAL/PLATELET
BASOS PCT: 0.3 % (ref 0.0–3.0)
Basophils Absolute: 0 10*3/uL (ref 0.0–0.1)
EOS PCT: 4.2 % (ref 0.0–5.0)
Eosinophils Absolute: 0.3 10*3/uL (ref 0.0–0.7)
HCT: 35.9 % — ABNORMAL LOW (ref 39.0–52.0)
HEMOGLOBIN: 12 g/dL — AB (ref 13.0–17.0)
LYMPHS PCT: 29.2 % (ref 12.0–46.0)
Lymphs Abs: 2.4 10*3/uL (ref 0.7–4.0)
MCHC: 33.5 g/dL (ref 30.0–36.0)
MCV: 99.6 fl (ref 78.0–100.0)
MONO ABS: 0.7 10*3/uL (ref 0.1–1.0)
Monocytes Relative: 8.6 % (ref 3.0–12.0)
NEUTROS PCT: 57.7 % (ref 43.0–77.0)
Neutro Abs: 4.8 10*3/uL (ref 1.4–7.7)
Platelets: 157 10*3/uL (ref 150.0–400.0)
RBC: 3.61 Mil/uL — AB (ref 4.22–5.81)
RDW: 13.6 % (ref 11.5–15.5)
WBC: 8.4 10*3/uL (ref 4.0–10.5)

## 2014-03-08 LAB — BASIC METABOLIC PANEL
BUN: 15 mg/dL (ref 6–23)
CHLORIDE: 101 meq/L (ref 96–112)
CO2: 30 meq/L (ref 19–32)
Calcium: 9.3 mg/dL (ref 8.4–10.5)
Creatinine, Ser: 1.6 mg/dL — ABNORMAL HIGH (ref 0.4–1.5)
GFR: 43.34 mL/min — ABNORMAL LOW (ref >60.00)
Glucose, Bld: 146 mg/dL — ABNORMAL HIGH (ref 70–99)
POTASSIUM: 4.8 meq/L (ref 3.5–5.1)
SODIUM: 138 meq/L (ref 135–145)

## 2014-03-10 MED ORDER — SODIUM CHLORIDE 0.9 % IR SOLN
80.0000 mg | Status: DC
  Filled 2014-03-10: qty 2

## 2014-03-10 MED ORDER — CEFAZOLIN SODIUM-DEXTROSE 2-3 GM-% IV SOLR
2.0000 g | INTRAVENOUS | Status: DC
  Filled 2014-03-10: qty 50

## 2014-03-10 MED ORDER — CHLORHEXIDINE GLUCONATE 4 % EX LIQD
60.0000 mL | Freq: Once | CUTANEOUS | Status: DC
  Filled 2014-03-10: qty 60

## 2014-03-11 ENCOUNTER — Encounter (HOSPITAL_COMMUNITY)
Admission: RE | Disposition: A | Discharge status: Home / Self Care | Payer: Self-pay | Source: Ambulatory Visit | Attending: Internal Medicine

## 2014-03-11 ENCOUNTER — Ambulatory Visit (HOSPITAL_COMMUNITY)
Admission: RE | Admit: 2014-03-11 | Discharge: 2014-03-11 | Disposition: A | Discharge status: Home / Self Care | Payer: Medicare Other | Source: Ambulatory Visit | Attending: Internal Medicine | Admitting: Internal Medicine

## 2014-03-11 DIAGNOSIS — I5022 Chronic systolic (congestive) heart failure: Secondary | ICD-10-CM | POA: Insufficient documentation

## 2014-03-11 DIAGNOSIS — I252 Old myocardial infarction: Secondary | ICD-10-CM | POA: Insufficient documentation

## 2014-03-11 DIAGNOSIS — Z951 Presence of aortocoronary bypass graft: Secondary | ICD-10-CM | POA: Insufficient documentation

## 2014-03-11 DIAGNOSIS — E039 Hypothyroidism, unspecified: Secondary | ICD-10-CM | POA: Insufficient documentation

## 2014-03-11 DIAGNOSIS — E785 Hyperlipidemia, unspecified: Secondary | ICD-10-CM | POA: Insufficient documentation

## 2014-03-11 DIAGNOSIS — Z79899 Other long term (current) drug therapy: Secondary | ICD-10-CM | POA: Insufficient documentation

## 2014-03-11 DIAGNOSIS — I10 Essential (primary) hypertension: Secondary | ICD-10-CM | POA: Insufficient documentation

## 2014-03-11 DIAGNOSIS — I2589 Other forms of chronic ischemic heart disease: Secondary | ICD-10-CM | POA: Insufficient documentation

## 2014-03-11 DIAGNOSIS — K5730 Diverticulosis of large intestine without perforation or abscess without bleeding: Secondary | ICD-10-CM | POA: Insufficient documentation

## 2014-03-11 DIAGNOSIS — E1142 Type 2 diabetes mellitus with diabetic polyneuropathy: Secondary | ICD-10-CM | POA: Insufficient documentation

## 2014-03-11 DIAGNOSIS — I509 Heart failure, unspecified: Secondary | ICD-10-CM | POA: Insufficient documentation

## 2014-03-11 DIAGNOSIS — Z87891 Personal history of nicotine dependence: Secondary | ICD-10-CM | POA: Insufficient documentation

## 2014-03-11 DIAGNOSIS — E1149 Type 2 diabetes mellitus with other diabetic neurological complication: Secondary | ICD-10-CM | POA: Insufficient documentation

## 2014-03-11 DIAGNOSIS — Z4502 Encounter for adjustment and management of automatic implantable cardiac defibrillator: Secondary | ICD-10-CM | POA: Insufficient documentation

## 2014-03-11 HISTORY — PX: IMPLANTABLE CARDIOVERTER DEFIBRILLATOR (ICD) GENERATOR CHANGE: SHX5469

## 2014-03-11 LAB — SURGICAL PCR SCREEN
MRSA, PCR: NEGATIVE
STAPHYLOCOCCUS AUREUS: NEGATIVE

## 2014-03-11 LAB — GLUCOSE, CAPILLARY: Glucose-Capillary: 132 mg/dL — ABNORMAL HIGH (ref 70–99)

## 2014-03-11 SURGERY — ICD GENERATOR CHANGE
Anesthesia: LOCAL

## 2014-03-11 MED ORDER — ONDANSETRON HCL 4 MG/2ML IJ SOLN: 4.0000 mg | Freq: Four times a day (QID) | INTRAMUSCULAR | Status: DC | PRN

## 2014-03-11 MED ORDER — LIDOCAINE HCL (PF) 1 % IJ SOLN
INTRAMUSCULAR | Status: AC
  Filled 2014-03-11: qty 30

## 2014-03-11 MED ORDER — MUPIROCIN 2 % EX OINT
TOPICAL_OINTMENT | CUTANEOUS | Status: DC
Start: 2014-03-11 — End: 2014-03-11
  Filled 2014-03-11: qty 22

## 2014-03-11 MED ORDER — MUPIROCIN 2 % EX OINT
TOPICAL_OINTMENT | Freq: Two times a day (BID) | CUTANEOUS | Status: DC
  Administered 2014-03-11: 1 via NASAL
  Filled 2014-03-11: qty 22

## 2014-03-11 MED ORDER — MIDAZOLAM HCL 5 MG/5ML IJ SOLN
INTRAMUSCULAR | Status: AC
  Filled 2014-03-11: qty 5

## 2014-03-11 MED ORDER — FENTANYL CITRATE 0.05 MG/ML IJ SOLN
INTRAMUSCULAR | Status: AC
  Filled 2014-03-11: qty 2

## 2014-03-11 MED ORDER — SODIUM CHLORIDE 0.9 % IV SOLN
INTRAVENOUS | Status: DC
  Administered 2014-03-11: 11:00:00 via INTRAVENOUS

## 2014-03-11 MED ORDER — ACETAMINOPHEN 325 MG PO TABS
325.0000 mg | ORAL_TABLET | ORAL | Status: DC | PRN
  Filled 2014-03-11: qty 2

## 2014-03-11 MED ORDER — HEPARIN (PORCINE) IN NACL 2-0.9 UNIT/ML-% IJ SOLN
INTRAMUSCULAR | Status: AC
  Filled 2014-03-11: qty 500

## 2014-03-11 NOTE — Discharge Instructions (Signed)
Pacemaker Battery Change A pacemaker battery usually lasts 4 to 12 years. Once or twice per year, you will be asked to visit your health care provider to have a full evaluation of your pacemaker. When a battery needs to be replaced, the entire pacemaker is replaced so that you can benefit from new circuitry and any new features that have been added to pacemakers. Most often, this procedure is very simple because the leads are already in place.  There are many things that affect how long a pacemaker battery will last, including:   The age of the pacemaker.   The number of leads (1, 2, or 3).   The pacemaker work load. If the pacemaker is helping the heart more often, the battery will not last as long as it would if the pacemaker did not need to help the heart.   Power (voltage) settings. LET [facility] CARE PROVIDER KNOW ABOUT:   Any allergies you have.   All medicines you are taking, including vitamins, herbs, eye drops, creams, and over-the-counter medicines.   Previous problems you or members of your family have had with the use of anesthetics.   Any blood disorders you have.   Previous surgeries you have had, especially since your last pacemaker placement.   Medical conditions you have.   Possible pregnancy, if applicable.  Symptoms of chest pain, trouble breathing, palpitations, lightheadedness, or feelings of an abnormal or irregular heartbeat. RISKS AND COMPLICATIONS  Generally, this is a safe procedure. However, as with any procedure, complications can occur. Possible complications include:   Bleeding.   Bruising of the skin around where the incision was made.   Pain at the incision site.   Pulling apart of the skin at the incision site.   Infection.   Allergic reaction to anesthetics or other medicines used during the procedure.  Diabetics may have a temporary increase in their blood sugar after any surgical procedure.  BEFORE THE PROCEDURE   Wash  all of the skin around the area of the chest where the pacemaker is located.   Ask your health care provider for help with any medicine adjustments before the pacemaker is replaced.   Unless advised otherwise, do not eat or drink after midnight on the night before the procedure. You may drink water to take your medicine as you normally would or as directed. PROCEDURE   After giving medicine to numb the skin, your health care provider will make a cut to reopen the pocket holding the pacemaker.   The old pacemaker will be disconnected from its leads.   The leads will be tested.   If needed, the leads will be replaced. If the leads are functioning properly, the new pacemaker may be connected to the existing leads.  A heart monitor and the pacemaker programmer will be used to make sure that the new pacemaker is working properly.  The incision site is then closed. A dressing is placed over the pacemaker site. The dressing is removed 24 to 48 hours afterwards. AFTER THE PROCEDURE   You will be taken to a recovery area after the new pacemaker implant is completed. Your vital signs such as blood pressure, heart rate, breathing, and oxygen levels will be monitored.  Your health care provider will tell you when you will need to next test your pacemaker or when to return to the office for follow-up for removal of stitches. Document Released: 01/16/2007 Document Revised: 06/10/2013 Document Reviewed: 04/22/2013 [facility] Patient Information 2014 [place].

## 2014-03-11 NOTE — CV Procedure (Signed)
Electrophysiology Procedure Note  Procedure: Removal of a previous implanted ICD which had reached elective replacement, and insertion of a new ICD.  Indication: Ischemic cardiomyopathy, chronic systolic heart failure class II, ejection fraction 25% by echo, most recently performed less than a month ago, status post remote prior MI, status post remote ICD with the current device at elective replacement  Description of the procedure: After informed consent was obtained, the patient was taken to the diagnostic electrophysiology laboratory in the fasting state. After the usual preparation and draping, intravenous Versed and fentanyl were given for sedation. 30 cc of lidocaine was infiltrated into the left infraclavicular region. A 5 cm incision was carried out and electrocautery was utilized to dissect down to the ICD pocket. The ICD generator was removed with gentle traction. The old ICD lead, which was a Medtronic [id number] active-fixation lead was evaluated. The R waves measured 9 mV, and a pacing impedance was 342 ohms. The patient threshold was one volt at 0.4 ms. The new Medtronic ICD, serial number [id number] was connected to the old defibrillator lead and placed back in a subcutaneous pocket. The pocket was irrigated with antibiotic irrigation. The incision was closed with 2 layers of Vicryl suture. Benzoin and Steri-Strips for pain on the skin. The patient was returned to his room in satisfactory condition.  Complications: There were no immediate procedure complications  Conclusion: Successful removal of a previously implanted single-chamber ICD which had reached elective replacement, and insertion of a new single-chamber ICD with no immediate procedure complication.  Cristopher Peru, M.D.

## 2014-03-11 NOTE — H&P (Signed)
  ICD Criteria  Current LVEF:25% ;Obtained < 1 month ago.  NYHA Functional Classification: Class II  Heart Failure History:  Yes, Duration of heart failure since onset is > 9 months  Non-Ischemic Dilated Cardiomyopathy History:  No.  Atrial Fibrillation/Atrial Flutter:  No.  Ventricular Tachycardia History:  No.  Cardiac Arrest History:  No  History of Syndromes with Risk of Sudden Death:  No.  Previous ICD:  Yes, ICD Type:  Single, Reason for ICD:  Primary prevention.  25%  Electrophysiology Study: No.  Prior MI: Yes, Most recent MI timeframe is > 40 days.  PPM: No.  OSA:  No  Patient Life Expectancy of >=1 year: Yes.  Anticoagulation Therapy:  Patient is NOT on anticoagulation therapy.   Beta Blocker Therapy:  Yes.   Ace Inhibitor/ARB Therapy:  Yes.

## 2014-03-11 NOTE — H&P (Signed)
Sambo Bassano Nou  02/04/2014 3:15 PM   Office Visit  MRN:  [id number]   Description: 80 year old male  Provider: Evans Lance, MD  Department: [facility]        Referring Provider    Geoffery Lyons, MD      Diagnoses    Ischemic cardiomyopathy    -  Primary    414.8    Cardiomyopathy, ischemic        414.8    Automatic implantable cardiac defibrillator in situ        V45.02          Progress Notes    Evans Lance, MD at 02/04/2014  5:24 PM    Status: Signed                      HPI Mr. Chukwu returns today for followup. He is a very pleasant 80 year old man with a history of chronic systolic heart failure, an ischemic cardiomyopathy after a large anterior myocardial infarction, status post ICD implantation. In 2009, his ICD lead fracture, and he underwent insertion of a new device. He has been stable in the interim. He returns today and is noted to have reached ERI on his device. Overall the patient is stable with no worsening heart there symptoms and no anginal symptoms. His heart failure is class II. No Known Allergies      Current Outpatient Prescriptions   Medication  Sig  Dispense  Refill   .  acetaminophen (TYLENOL) 500 MG tablet  Take 500 mg by mouth every 6 (six) hours as needed. For pain         .  amiodarone (PACERONE) 200 MG tablet  Take 100 mg by mouth every morning.         Marland Kitchen  aspirin EC 325 MG tablet  Take 325 mg by mouth every morning.         Marland Kitchen  atorvastatin (LIPITOR) 20 MG tablet  Take 1 tablet by mouth daily.         .  calcium carbonate (TUMS EX) 750 MG chewable tablet  Chew 1 tablet by mouth daily as needed.          .  carvedilol (COREG) 12.5 MG tablet  TAKE 1 TABLET BY MOUTH 2 TIMES DAILY.   60 tablet   5   .  cholecalciferol (VITAMIN D) 1000 UNITS tablet  Take 1,000 Units by mouth daily.         .  diphenhydrAMINE (BENADRYL) 25 mg capsule  Take 25 mg by mouth every 6 (six) hours as needed. For allergies         .   dorzolamide-timolol (COSOPT) 22.3-6.8 MG/ML ophthalmic solution  Place 1 drop into the left eye daily.          .  ferrous sulfate 325 (65 FE) MG tablet  Take 325 mg by mouth daily with breakfast.         .  furosemide (LASIX) 40 MG tablet  Take 0.5 tablets (20 mg total) by mouth every morning.   30 tablet   11   .  gabapentin (NEURONTIN) 300 MG capsule  Take 600 mg by mouth at bedtime.          Marland Kitchen  glimepiride (AMARYL) 4 MG tablet  Take 1 tablet by mouth 2 (two) times daily.          .  Inulin (METAMUCIL  CLEAR & NATURAL) POWD  Take 15 mLs by mouth daily.         Marland Kitchen  levothyroxine (SYNTHROID, LEVOTHROID) 125 MCG tablet  Take 125 mcg by mouth every morning.          Marland Kitchen  LUMIGAN 0.01 % SOLN  Place 1 drop into the left eye at bedtime.          .  metFORMIN (GLUCOPHAGE) 500 MG tablet  Take 1 tablet by mouth 2 (two) times daily.          .  Multiple Vitamins-Minerals (CENTRUM SILVER PO)  Take by mouth daily.           .  nitroGLYCERIN (NITROSTAT) 0.4 MG SL tablet  Place 1 tablet (0.4 mg total) under the tongue every 5 (five) minutes as needed.   90 tablet   11   .  ramipril (ALTACE) 1.25 MG capsule  TAKE 1 CAPSULE BY MOUTH EVERY DAY   30 capsule   3   .  tamsulosin (FLOMAX) 0.4 MG CAPS capsule  daily.         .  temazepam (RESTORIL) 30 MG capsule  Take 30 mg by mouth at bedtime as needed. For sleep             No current facility-administered medications for this visit.           Past Medical History   Diagnosis  Date   .  Ischemic heart disease     .  Hypothyroidism     .  Diabetes mellitus     .  Dyslipidemia     .  Essential hypertension     .  Ischemic cardiomyopathy     .  Diabetic neuropathy     .  Myocardial infarction  2006       LARGE ANTERIOR WALL   .  Diverticulosis     .  Colon polyps     .  Internal hemorrhoid     .  Allergy     .  Cataract     .  Glaucoma     .  CHF (congestive heart failure)     .  Hyperlipidemia     .  ICD (implantable cardiac defibrillator) in place   11-2004   .  Pacemaker  11-2004        ROS:    All systems reviewed and negative except as noted in the HPI.      Past Surgical History   Procedure  Laterality  Date   .  Coronary artery bypass graft    12/02/04   .  Cardiac defibrillator placement       .  Cholecystectomy, laparoscopic       .  Eye surgery           SOCKET SURGERY-BORN WITHOUT RIGHT EYE   .  Shoulder surgery           RIGHT   .  Cataract extraction           left   .  Cholecystectomy       .  Tonsillectomy       .  Esophagogastroduodenoscopy  N/A  12/01/2012       Procedure: ESOPHAGOGASTRODUODENOSCOPY (EGD);  Surgeon: Irene Shipper, MD;  Location: [facility];  Service: Endoscopy;  Laterality: N/A;   .  Colonoscopy  N/A  12/01/2012       Procedure: COLONOSCOPY;  Surgeon: Irene Shipper,  MD;  Location: [facility];  Service: Endoscopy;  Laterality: N/A;           Family History   Problem  Relation  Age of Onset   .  Colon cancer  Neg Hx     .  Esophageal cancer  Neg Hx     .  Rectal cancer  Neg Hx     .  Stomach cancer  Neg Hx     .  Diabetes  Mother     .  Prostate cancer  Father     .  Cancer  Daughter         ? unknown, in stomach   .  Diabetes               History       Social History   .  Marital Status:  Married       Spouse Name:  N/A       Number of Children:  1   .  Years of Education:  N/A       Occupational History   .  retired         Social History Main Topics   .  Smoking status:  Former Smoker       Types:  Cigarettes       Quit date:  10/22/2004   .  Smokeless tobacco:  Never Used         Comment: quit ismoking pipe/cigar n 2006   .  Alcohol Use:  No   .  Drug Use:  No   .  Sexual Activity:  Not on file       Other Topics  Concern   .  Not on file       Social History Narrative   .  No narrative on file          BP 136/56  Pulse 58  Ht 5\' 5"  (1.651 m)  Wt 191 lb (86.637 kg)  BMI 31.78 kg/m2   Physical Exam:   Well appearing  80 year old man, with a bandage over his right eye, NAD HEENT: Unremarkable, except for a bandage over the right eye, no underlying right eye present Neck:  No JVD, no thyromegally Back:  No CVA tenderness Lungs:  Clear with no wheezes, rales, or rhonchi. HEART:  Regular rate rhythm, no murmurs, no rubs, no clicks Abd:  soft, positive bowel sounds, no organomegally, no rebound, no guarding Ext:  2 plus pulses, no edema, no cyanosis, no clubbing Skin:  No rashes no nodules Neuro:  CN II through XII intact, motor grossly intact     DEVICE   Device interrogation demonstrates normal device function.    Assess/Plan:         Ischemic cardiomyopathy - Evans Lance, MD at 02/04/2014  5:28 PM    Status: Alison Stalling Related Problem: Ischemic cardiomyopathy    He denies anginal symptoms. He will continue his current meds. Previously his EF was 25%, and I would be surprised if it has improved. No change in medical therapy.    Revision History       Date/Time User Action    > 02/04/2014  5:31 PM Evans Lance, MD Edit      02/04/2014  5:28 PM Evans Lance, MD [facility] - Evans Lance, MD at 02/04/2014  5:29 PM  Status: Written Related Problem: AUTOMATIC IMPLANTABLE CARDIAC DEFIBRILLATOR SITU    His medtronic device has reached ERI. I have asked the patient to undergo 2D echo and if EF is equal to or under 35%, undergo ICD generator removal and replacement of his old device which appears to be working normally.            Encounter-Level Documents:    Electronic signature on 02/04/2014 3:08 PM          Vital Signs Most recent update: 02/04/2014  3:31 PM by Roberts Gaudy, CMA    BP Ht Wt BMI        136/56 5\' 5"  (1.651 m) 191 lb (86.637 kg) 31.78 kg/m2           Orders Placed This Encounter    Future Labs/Procedures Expected by Expires      2D Echocardiogram without contrast [ECH1000 Custom] As directed 02/05/2015             Medication Notes    tamsulosin (FLOMAX) 0.4 MG CAPS capsule      Roberts Gaudy, CMA 02/04/2014  3:28 PM Received from: External Pharmacy Received Sig:         Level of Service    PR OFFICE OUTPATIENT VISIT 25 MINUTES [99214]         All Charges for This Encounter    Code Description Service Date Service Provider Modifiers Qty    [place] OUTPATIENT VISIT 25 MINUTES 02/04/2014 Evans Lance, MD   1      Patient Instructions    Your physician has requested that you have an echocardiogram. Echocardiography is a painless test that uses sound waves to create images of your heart. It provides your doctor with information about the size and shape of your heart and how well your heart's chambers and valves are working. This procedure takes approximately one hour. There are no restrictions for this procedure.     Will schedule a generator change once echo is complete           Previous Visit      Provider Department Encounter #    01/22/2014  8:40 AM Cristopher Peru, MD [facility] [id number]     EP Attending   Patient seen and examined. Since prior clinic visit, repeat echo demonstrates and EF of 25%. Will plan to proceed with ICD generator change.   Mikle Bosworth.D.

## 2014-03-12 ENCOUNTER — Encounter (HOSPITAL_COMMUNITY): Payer: Self-pay | Admitting: *Deleted

## 2014-03-17 ENCOUNTER — Telehealth: Payer: Self-pay | Admitting: Internal Medicine

## 2014-03-17 NOTE — Telephone Encounter (Signed)
New message      When can pt get his pacemaker bandages wet?

## 2014-03-17 NOTE — Telephone Encounter (Signed)
Spoke with patient, he may shower 5/28 and restrict driving until wound check 6/1.

## 2014-03-22 ENCOUNTER — Ambulatory Visit (INDEPENDENT_AMBULATORY_CARE_PROVIDER_SITE_OTHER): Payer: Medicare Other | Admitting: *Deleted

## 2014-03-22 ENCOUNTER — Encounter: Payer: Self-pay | Admitting: Internal Medicine

## 2014-03-22 DIAGNOSIS — I255 Ischemic cardiomyopathy: Secondary | ICD-10-CM

## 2014-03-22 DIAGNOSIS — I2589 Other forms of chronic ischemic heart disease: Secondary | ICD-10-CM

## 2014-03-22 LAB — MDC_IDC_ENUM_SESS_TYPE_INCLINIC
Battery Remaining Longevity: 138 mo
Battery Voltage: 3.16 V
Brady Statistic RV Percent Paced: 0.36 %
Date Time Interrogation Session: 20150601110616
HighPow Impedance: 171 Ohm
HighPow Impedance: 48 Ohm
Lead Channel Setting Pacing Pulse Width: 0.4 ms
Lead Channel Setting Sensing Sensitivity: 0.3 mV
MDC IDC MSMT LEADCHNL RV IMPEDANCE VALUE: 380 Ohm
MDC IDC MSMT LEADCHNL RV PACING THRESHOLD AMPLITUDE: 0.875 V
MDC IDC MSMT LEADCHNL RV PACING THRESHOLD PULSEWIDTH: 0.4 ms
MDC IDC MSMT LEADCHNL RV SENSING INTR AMPL: 7.875 mV
MDC IDC MSMT LEADCHNL RV SENSING INTR AMPL: 8.25 mV
MDC IDC SET LEADCHNL RV PACING AMPLITUDE: 2.5 V
MDC IDC SET ZONE DETECTION INTERVAL: 320 ms
Zone Setting Detection Interval: 340 ms
Zone Setting Detection Interval: 400 ms

## 2014-03-22 NOTE — Progress Notes (Signed)
Wound check appointment. Steri-strips removed. Wound without redness or edema. Incision edges approximated, wound well healed. Normal device function. Threshold, sensing, and impedances consistent with implant measurements. SIC=0. Device programmed at appropriate safety margins. Histogram distribution appropriate for patient and level of activity. No ventricular arrhythmias noted. Patient educated about wound care, arm mobility, and shock plan. ROV in 3 months with GT.

## 2014-03-24 ENCOUNTER — Other Ambulatory Visit: Payer: Self-pay | Admitting: Cardiology

## 2014-03-29 ENCOUNTER — Encounter: Payer: Self-pay | Admitting: Cardiology

## 2014-03-29 ENCOUNTER — Ambulatory Visit (INDEPENDENT_AMBULATORY_CARE_PROVIDER_SITE_OTHER): Payer: Medicare Other | Admitting: Cardiology

## 2014-03-29 VITALS — BP 127/67 | HR 58 | Ht 65.0 in | Wt 192.0 lb

## 2014-03-29 DIAGNOSIS — I2589 Other forms of chronic ischemic heart disease: Secondary | ICD-10-CM

## 2014-03-29 DIAGNOSIS — I119 Hypertensive heart disease without heart failure: Secondary | ICD-10-CM

## 2014-03-29 DIAGNOSIS — Z95810 Presence of automatic (implantable) cardiac defibrillator: Secondary | ICD-10-CM

## 2014-03-29 DIAGNOSIS — I259 Chronic ischemic heart disease, unspecified: Secondary | ICD-10-CM

## 2014-03-29 DIAGNOSIS — I255 Ischemic cardiomyopathy: Secondary | ICD-10-CM

## 2014-03-29 DIAGNOSIS — I2510 Atherosclerotic heart disease of native coronary artery without angina pectoris: Secondary | ICD-10-CM

## 2014-03-29 DIAGNOSIS — E7800 Pure hypercholesterolemia, unspecified: Secondary | ICD-10-CM

## 2014-03-29 NOTE — Progress Notes (Signed)
Pu[place] Date of Birth:  06/18/1933 [place] [address] [place], [place]  [address] [phone]        Fax   [phone]   History of Present Illness: This pleasant 80 year old gentleman is seen for a six-month followup office visit. Gregory [name] He has a past history of ischemic cardiomyopathy. He has chronic systolic congestive heart failure. He has a history of previous coronary bypass graft surgery. He has an ICD in place.  He had an ICD generator change on 03/11/14.  He has a Medtronic single chamber device. He has a history of diabetes mellitus. His ejection fraction by stress test in June 2010 was 37%.  His last echocardiogram on 02/16/14 showed an ejection fraction of 25-30%.  There was mild mitral regurgitation.  There was evidence of a high diastolic filling pressures with diastolic mitral regurgitation and with an E./E. prime ratio of more than 25. The patient is on long-term low-dose amiodarone because of previous ventricular tachycardia.  Current Outpatient Prescriptions  Medication Sig Dispense Refill  . acetaminophen (TYLENOL) 500 MG tablet Take 500 mg by mouth every 6 (six) hours as needed. For pain      . amiodarone (PACERONE) 200 MG tablet Take 100 mg by mouth every morning.      Gregory [name] aspirin EC 325 MG tablet Take 325 mg by mouth every morning.      Gregory [name] atorvastatin (LIPITOR) 20 MG tablet Take 20 mg by mouth daily.       . calcium carbonate (TUMS EX) 750 MG chewable tablet Chew 1 tablet by mouth daily as needed for heartburn.       . carvedilol (COREG) 12.5 MG tablet Take 12.5 mg by mouth 2 (two) times daily with a meal.      . cetirizine (ZYRTEC) 10 MG tablet Take 10 mg by mouth daily.      . cholecalciferol (VITAMIN D) 1000 UNITS tablet Take 1,000 Units by mouth daily.      . diphenhydrAMINE (BENADRYL) 25 mg capsule Take 25 mg by mouth daily as needed for allergies.       Gregory [name] dorzolamide-timolol (COSOPT) 22.3-6.8 MG/ML ophthalmic solution Place 1 drop  into the left eye daily.      . ferrous sulfate 325 (65 FE) MG tablet Take 325 mg by mouth daily with breakfast.      . furosemide (LASIX) 40 MG tablet Take 20 mg by mouth daily.      Gregory [name] gabapentin (NEURONTIN) 300 MG capsule Take 600 mg by mouth at bedtime.       Gregory [name] glimepiride (AMARYL) 4 MG tablet Take 4 mg by mouth 2 (two) times daily.       . Inulin (METAMUCIL CLEAR & NATURAL) POWD Take 15 mLs by mouth 3 (three) times a week.       . latanoprost (XALATAN) 0.005 % ophthalmic solution Place 1 drop into the left eye at bedtime.      Gregory [name] levothyroxine (SYNTHROID, LEVOTHROID) 125 MCG tablet Take 125 mcg by mouth every morning.       . metFORMIN (GLUCOPHAGE) 500 MG tablet Take 500 mg by mouth 2 (two) times daily.       . Multiple Vitamins-Minerals (CENTRUM SILVER PO) Take 1 tablet by mouth daily.       . nitroGLYCERIN (NITROSTAT) 0.4 MG SL tablet Place 0.4 mg under the tongue every 5 (five) minutes as needed for chest pain.      . ramipril (ALTACE) 1.25  MG capsule Take 1.25 mg by mouth daily.      . ramipril (ALTACE) 1.25 MG capsule TAKE 1 CAPSULE BY MOUTH EVERY DAY  30 capsule  0  . tamsulosin (FLOMAX) 0.4 MG CAPS capsule Take 0.4 mg by mouth daily after breakfast.       . temazepam (RESTORIL) 30 MG capsule Take 30 mg by mouth at bedtime as needed. For sleep       No current facility-administered medications for this visit.    No Known Allergies  Patient Active Problem List   Diagnosis Date Noted  . CAD 02/02/2008    Priority: High  . AUTOMATIC IMPLANTABLE CARDIAC DEFIBRILLATOR SITU 03/15/2009    Priority: Medium  . CHF 02/02/2008    Priority: Medium  . 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 Constipation 05/18/2013  . Ischemic heart disease   . Myocardial infarction   . Hypothyroidism   . Diabetes mellitus   . Dyslipidemia   . Benign hypertensive heart disease without heart failure   . Ischemic cardiomyopathy   . Diabetic neuropathy   . HYPOTHYROIDISM 03/15/2009  .  DYSLIPIDEMIA 03/15/2009  . DM 02/02/2008  . DEPRESSION 02/02/2008  . MI 02/02/2008    History  Smoking status  . Former Smoker  . Types: Cigarettes  . Quit date: 10/22/2004  Smokeless tobacco  . Never Used    Comment: quit ismoking pipe/cigar n 2006    History  Alcohol Use No    Family History  Problem Relation Age of Onset  . Colon cancer Neg Hx   . Esophageal cancer Neg Hx   . Rectal cancer Neg Hx   . Stomach cancer Neg Hx   . Diabetes Mother   . Prostate cancer Father   . Cancer Daughter     ? unknown, in stomach  . Diabetes      Review of Systems: Constitutional: no fever chills diaphoresis or fatigue or change in weight.  Head and neck: no hearing loss, no epistaxis, no photophobia or visual disturbance. Respiratory: No cough, shortness of breath or wheezing. Cardiovascular: No chest pain peripheral edema, palpitations. Gastrointestinal: No abdominal distention, no abdominal pain, no change in bowel habits hematochezia or melena. Genitourinary: No dysuria, no frequency, no urgency, no nocturia. Musculoskeletal:No arthralgias, no back pain, no gait disturbance or myalgias. Neurological: No dizziness, no headaches, no numbness, no seizures, no syncope, no weakness, no tremors. Hematologic: No lymphadenopathy, no easy bruising. Psychiatric: No confusion, no hallucinations, no sleep disturbance.    Physical Exam: Filed Vitals:   03/29/14 1021  BP: 127/67  Pulse: 58   the general appearance reveals a well-developed elderly gentleman in no distress.  He wears a patch over his right eye.    The mouth and pharynx are normal.  The neck is supple.  The carotids reveal no bruits.  The jugular venous pressure is normal.  The  thyroid is not enlarged.  There is no lymphadenopathy.  The chest is clear to percussion and auscultation.  There are no rales or rhonchi.  Expansion of the chest is symmetrical.  There is a defibrillator in the left upper chest  The precordium is  quiet.  The first heart sound is normal.  The second heart sound is physiologically split.  There is no murmur gallop rub or click.  There is no abnormal lift or heave.  The abdomen is soft and nontender.  The bowel sounds are normal.  The liver and spleen are not enlarged.  There are no abdominal masses.  There are no abdominal bruits.  Extremities reveal good pedal pulses.  There is no phlebitis or edema.  There is no cyanosis or clubbing.  Strength is normal and symmetrical in all extremities.  There is no lateralizing weakness.  There are no sensory deficits.  The skin is warm and dry.  There is no rash.     Assessment / Plan: 1. ischemic heart disease with ischemic cardiomyopathy 2. functioning Medtronic single-chamber ICD 3. Hypercholesterolemia 4. benign hypertensive heart disease without heart failure  Plan: Continue same medication.  Recheck in 6 months for followup office visit and EKG

## 2014-03-29 NOTE — Patient Instructions (Signed)
Your physician recommends that you continue on your current medications as directed. Please refer to the Current Medication list given to you today.  Your physician wants you to follow-up in: 6 MONTH OV /EKG You will receive a reminder letter in the mail two months in advance. If you don't receive a letter, please call our office to schedule the follow-up appointment.  

## 2014-03-29 NOTE — Assessment & Plan Note (Signed)
Patient has not been having symptoms of worsening heart failure.  He has mild exertional dyspnea which has been chronic.  He has not been expressing any chest pain or angina.

## 2014-03-29 NOTE — Assessment & Plan Note (Signed)
The patient has not had any shocks from his ICD.  He has not had any dizziness or syncope

## 2014-03-29 NOTE — Assessment & Plan Note (Signed)
Patient has not had to take any sublingual nitroglycerin.  No angina pectoris.

## 2014-04-16 ENCOUNTER — Other Ambulatory Visit: Payer: Self-pay | Admitting: *Deleted

## 2014-04-16 MED ORDER — NITROGLYCERIN 0.4 MG SL SUBL: 0.4000 mg | SUBLINGUAL_TABLET | SUBLINGUAL | Status: DC | PRN

## 2014-04-24 ENCOUNTER — Other Ambulatory Visit: Payer: Self-pay | Admitting: Cardiology

## 2014-04-29 ENCOUNTER — Telehealth: Payer: Self-pay | Admitting: Cardiology

## 2014-04-29 MED ORDER — AMIODARONE HCL 200 MG PO TABS: 100.0000 mg | ORAL_TABLET | Freq: Every morning | ORAL | Status: DC

## 2014-04-29 NOTE — Telephone Encounter (Signed)
New message     Dr Reynaldo Minium put pt on amiodarone. He is no longer going to Dr Reynaldo Minium.  His new doctor--Dr Haskell Riling not refill it because he is not comfortable with it.  Will Dr Mare Ferrari refill it?

## 2014-04-29 NOTE — Telephone Encounter (Signed)
Will forward to  Dr. Brackbill for review 

## 2014-04-29 NOTE — Telephone Encounter (Signed)
Yes we can refill his amiodarone from now on

## 2014-04-29 NOTE — Telephone Encounter (Signed)
Advised patient and will send to [facility]

## 2014-05-04 ENCOUNTER — Other Ambulatory Visit: Payer: Self-pay | Admitting: Cardiology

## 2014-05-14 ENCOUNTER — Telehealth: Payer: Self-pay | Admitting: Internal Medicine

## 2014-05-14 NOTE — Telephone Encounter (Signed)
New message     Patient states that his remote monitor lights are a different color this morning.  Please call.

## 2014-05-14 NOTE — Telephone Encounter (Signed)
LMOM/kwm

## 2014-05-14 NOTE — Telephone Encounter (Signed)
Spoke w/pt and answered all questions about transmitter.

## 2014-05-14 NOTE — Telephone Encounter (Signed)
Tried to call but busy/kwm

## 2014-06-12 ENCOUNTER — Encounter: Payer: Self-pay | Admitting: Internal Medicine

## 2014-06-15 ENCOUNTER — Telehealth: Payer: Self-pay | Admitting: Cardiology

## 2014-06-15 NOTE — Telephone Encounter (Signed)
New message      Talk to [facility] regarding a presc change

## 2014-06-15 NOTE — Telephone Encounter (Signed)
Diabetic MD wants to increase Furosemide to full tablet daily. Patient not sure how long he was to increase but has follow up Thursday with him. Advised ok to increase as recommended and just call back Thursday with update

## 2014-06-16 NOTE — Telephone Encounter (Signed)
Agree with plan 

## 2014-06-22 ENCOUNTER — Telehealth: Payer: Self-pay | Admitting: Cardiology

## 2014-06-22 DIAGNOSIS — Z79899 Other long term (current) drug therapy: Secondary | ICD-10-CM

## 2014-06-22 MED ORDER — FUROSEMIDE 40 MG PO TABS: 40.0000 mg | ORAL_TABLET | Freq: Every day | ORAL | Status: DC

## 2014-06-22 NOTE — Telephone Encounter (Signed)
New message      Talk to [facility] regarding changing medication

## 2014-06-22 NOTE — Telephone Encounter (Signed)
Diabetic doctor recommended him continuing the Furosemide 40 mg daily. Patient states now recent labs. Ok to continue at full tablet daily and get BMET tomorrow when seeing Dr Lovena Le per  Dr. Mare Ferrari. Advised patient

## 2014-06-23 ENCOUNTER — Encounter: Payer: Self-pay | Admitting: Internal Medicine

## 2014-06-23 ENCOUNTER — Other Ambulatory Visit: Payer: Medicare Other

## 2014-06-23 ENCOUNTER — Ambulatory Visit (INDEPENDENT_AMBULATORY_CARE_PROVIDER_SITE_OTHER): Payer: Medicare Other | Admitting: Internal Medicine

## 2014-06-23 VITALS — BP 110/56 | HR 59 | Ht 65.0 in | Wt 187.0 lb

## 2014-06-23 DIAGNOSIS — I255 Ischemic cardiomyopathy: Secondary | ICD-10-CM

## 2014-06-23 DIAGNOSIS — I509 Heart failure, unspecified: Secondary | ICD-10-CM

## 2014-06-23 DIAGNOSIS — Z79899 Other long term (current) drug therapy: Secondary | ICD-10-CM

## 2014-06-23 DIAGNOSIS — Z95810 Presence of automatic (implantable) cardiac defibrillator: Secondary | ICD-10-CM

## 2014-06-23 DIAGNOSIS — I2589 Other forms of chronic ischemic heart disease: Secondary | ICD-10-CM

## 2014-06-23 LAB — MDC_IDC_ENUM_SESS_TYPE_INCLINIC
Battery Remaining Longevity: 137 mo
Battery Voltage: 3.15 V
Brady Statistic RV Percent Paced: 0.29 %
HIGH POWER IMPEDANCE MEASURED VALUE: 47 Ohm
HighPow Impedance: 171 Ohm
Lead Channel Impedance Value: 380 Ohm
Lead Channel Pacing Threshold Pulse Width: 0.4 ms
Lead Channel Sensing Intrinsic Amplitude: 7.875 mV
Lead Channel Setting Pacing Amplitude: 2.5 V
Lead Channel Setting Pacing Pulse Width: 0.4 ms
Lead Channel Setting Sensing Sensitivity: 0.3 mV
MDC IDC MSMT LEADCHNL RV PACING THRESHOLD AMPLITUDE: 0.875 V
MDC IDC MSMT LEADCHNL RV SENSING INTR AMPL: 7.125 mV
MDC IDC SESS DTM: 20150902105716
MDC IDC SET ZONE DETECTION INTERVAL: 340 ms
Zone Setting Detection Interval: 320 ms
Zone Setting Detection Interval: 400 ms

## 2014-06-23 LAB — BASIC METABOLIC PANEL
BUN: 20 mg/dL (ref 6–23)
CALCIUM: 9.4 mg/dL (ref 8.4–10.5)
CO2: 31 mEq/L (ref 19–32)
Chloride: 97 mEq/L (ref 96–112)
Creatinine, Ser: 1.9 mg/dL — ABNORMAL HIGH (ref 0.4–1.5)
GFR: 37.22 mL/min — ABNORMAL LOW (ref >60.00)
GLUCOSE: 139 mg/dL — AB (ref 70–99)
Potassium: 5.1 mEq/L (ref 3.5–5.1)
SODIUM: 137 meq/L (ref 135–145)

## 2014-06-23 NOTE — Addendum Note (Signed)
Addended by: Avie Echevaria on: 06/23/2014 11:15 AM   Modules accepted: Orders

## 2014-06-23 NOTE — Assessment & Plan Note (Signed)
His Medtronic single chamber ICD is working normally. Will recheck in several months.

## 2014-06-23 NOTE — Assessment & Plan Note (Signed)
He denies anginal symptoms. Will follow. 

## 2014-06-23 NOTE — Progress Notes (Signed)
Quick Note:  Please report to patient. The recent labs are stable. Continue same medication and careful diet. ______ 

## 2014-06-23 NOTE — Progress Notes (Signed)
HPI [name] returns today for followup. He is a very pleasant 80 year old man with a history of chronic systolic heart failure, an ischemic cardiomyopathy after a large anterior myocardial infarction, status post ICD implantation. In 2009, his ICD lead fracture, and he underwent insertion of a new device. He has been stable in the interim except he fell and injured his leg. He has some residual swelling.  No fracture. He underwent ICD generator change out several months ago. Overall the patient is stable with no worsening heart there symptoms and no anginal symptoms. His heart failure is class II. No Known Allergies   Current Outpatient Prescriptions  Medication Sig Dispense Refill  . acetaminophen (TYLENOL) 500 MG tablet Take 500 mg by mouth every 6 (six) hours as needed. For pain      . amiodarone (PACERONE) 200 MG tablet Take 0.5 tablets (100 mg total) by mouth every morning.  30 tablet  5  . aspirin EC 325 MG tablet Take 325 mg by mouth every morning.      Marland Kitchen atorvastatin (LIPITOR) 20 MG tablet Take 20 mg by mouth daily.       . calcium carbonate (TUMS EX) 750 MG chewable tablet Chew 1 tablet by mouth daily as needed for heartburn.       . carvedilol (COREG) 12.5 MG tablet Take 12.5 mg by mouth 2 (two) times daily with a meal.      . cetirizine (ZYRTEC) 10 MG tablet Take 10 mg by mouth daily.      . cholecalciferol (VITAMIN D) 1000 UNITS tablet Take 1,000 Units by mouth daily.      . diphenhydrAMINE (BENADRYL) 25 mg capsule Take 25 mg by mouth daily as needed for allergies.       Marland Kitchen dorzolamide-timolol (COSOPT) 22.3-6.8 MG/ML ophthalmic solution Place 1 drop into the left eye daily.      . ferrous sulfate 325 (65 FE) MG tablet Take 325 mg by mouth daily with breakfast.      . furosemide (LASIX) 40 MG tablet Take 1 tablet (40 mg total) by mouth daily.  30 tablet  5  . gabapentin (NEURONTIN) 300 MG capsule Take 600 mg by mouth at bedtime.       Marland Kitchen glimepiride (AMARYL) 4 MG tablet Take  4 mg by mouth 2 (two) times daily.       . Inulin (METAMUCIL CLEAR & NATURAL) POWD Take 15 mLs by mouth 3 (three) times a week.       . latanoprost (XALATAN) 0.005 % ophthalmic solution Place 1 drop into the left eye at bedtime.      Marland Kitchen levothyroxine (SYNTHROID, LEVOTHROID) 125 MCG tablet Take 125 mcg by mouth every morning.       . metFORMIN (GLUCOPHAGE) 500 MG tablet Take 500 mg by mouth 2 (two) times daily.       . Multiple Vitamins-Minerals (CENTRUM SILVER PO) Take 1 tablet by mouth daily.       . nitroGLYCERIN (NITROSTAT) 0.4 MG SL tablet Place 1 tablet (0.4 mg total) under the tongue every 5 (five) minutes as needed for chest pain.  25 tablet  5  . ramipril (ALTACE) 1.25 MG capsule TAKE 1 CAPSULE BY MOUTH EVERY DAY  30 capsule  5  . tamsulosin (FLOMAX) 0.4 MG CAPS capsule Take 0.4 mg by mouth daily after breakfast.       . temazepam (RESTORIL) 30 MG capsule Take 30 mg by mouth at bedtime as needed. For sleep  No current facility-administered medications for this visit.     Past Medical History  Diagnosis Date  . Ischemic heart disease   . Hypothyroidism   . Diabetes mellitus   . Dyslipidemia   . Essential hypertension   . Ischemic cardiomyopathy   . Diabetic neuropathy   . Myocardial infarction 2006    LARGE ANTERIOR WALL  . Diverticulosis   . Colon polyps   . Internal hemorrhoid   . Allergy   . Cataract   . Glaucoma   . CHF (congestive heart failure)   . Hyperlipidemia   . ICD (implantable cardiac defibrillator) in place 11-2004    ROS:   All systems reviewed and negative except as noted in the HPI.   Past Surgical History  Procedure Laterality Date  . Coronary artery bypass graft  12/02/04  . Cardiac defibrillator placement    . Cholecystectomy, laparoscopic    . Eye surgery      SOCKET SURGERY-BORN WITHOUT RIGHT EYE  . Shoulder surgery      RIGHT  . Cataract extraction      left  . Cholecystectomy    . Tonsillectomy    . Esophagogastroduodenoscopy  N/A 12/01/2012    Procedure: ESOPHAGOGASTRODUODENOSCOPY (EGD);  Surgeon: Irene Shipper, MD;  Location: [facility];  Service: Endoscopy;  Laterality: N/A;  . Colonoscopy N/A 12/01/2012    Procedure: COLONOSCOPY;  Surgeon: Irene Shipper, MD;  Location: [facility];  Service: Endoscopy;  Laterality: N/A;     Family History  Problem Relation Age of Onset  . Colon cancer Neg Hx   . Esophageal cancer Neg Hx   . Rectal cancer Neg Hx   . Stomach cancer Neg Hx   . Diabetes Mother   . Prostate cancer Father   . Cancer Daughter     ? unknown, in stomach  . Diabetes       History   Social History  . Marital Status: Married    Spouse Name: N/A    Number of Children: 1  . Years of Education: N/A   Occupational History  . retired    Social History Main Topics  . Smoking status: Former Smoker    Types: Cigarettes    Quit date: 10/22/2004  . Smokeless tobacco: Never Used     Comment: quit ismoking pipe/cigar n 2006  . Alcohol Use: No  . Drug Use: No  . Sexual Activity: Not on file   Other Topics Concern  . Not on file   Social History Narrative  . No narrative on file     BP 110/56  Pulse 59  Ht 5\' 5"  (1.651 m)  Wt 187 lb (84.823 kg)  BMI 31.12 kg/m2  SpO2 97%  Physical Exam:  Well appearing 80 year old man, NAD HEENT: Unremarkable, except for a bandage over the right eye, no underlying right eye present Neck:  No JVD, no thyromegally Back:  No CVA tenderness Lungs:  Clear with no wheezes, rales, or rhonchi. Well healed PPM incision. HEART:  Regular rate rhythm, no murmurs, no rubs, no clicks Abd:  soft, positive bowel sounds, no organomegally, no rebound, no guarding Ext:  2 plus pulses, no edema, no cyanosis, no clubbing Skin:  No rashes no nodules Neuro:  CN II through XII intact, motor grossly intact   DEVICE  Device interrogation demonstrates normal device function.   Assess/Plan:

## 2014-06-23 NOTE — Patient Instructions (Addendum)
Your physician recommends that you continue on your current medications as directed. Please refer to the Current Medication list given to you today.  Remote monitoring is used to monitor your Pacemaker of ICD from home. This monitoring reduces the number of office visits required to check your device to one time per year. It allows [place] to keep an eye on the functioning of your device to ensure it is working properly. You are scheduled for a device check from home on 09/27/14. You may send your transmission at any time that day. If you have a wireless device, the transmission will be sent automatically. After your physician reviews your transmission, you will receive a postcard with your next transmission date.  Your physician wants you to follow-up in: 9 months with Dr. Lovena Le.  You will receive a reminder letter in the mail two months in advance. If you don't receive a letter, please call our office to schedule the follow-up appointment.

## 2014-06-28 ENCOUNTER — Encounter: Payer: Self-pay | Admitting: Internal Medicine

## 2014-08-14 ENCOUNTER — Other Ambulatory Visit: Payer: Self-pay | Admitting: Cardiology

## 2014-09-27 ENCOUNTER — Ambulatory Visit (INDEPENDENT_AMBULATORY_CARE_PROVIDER_SITE_OTHER): Payer: Medicare PPO | Admitting: *Deleted

## 2014-09-27 DIAGNOSIS — I255 Ischemic cardiomyopathy: Secondary | ICD-10-CM

## 2014-09-27 DIAGNOSIS — I509 Heart failure, unspecified: Secondary | ICD-10-CM

## 2014-09-27 NOTE — Progress Notes (Signed)
Remote ICD transmission.   

## 2014-09-30 ENCOUNTER — Encounter (HOSPITAL_COMMUNITY): Payer: Self-pay | Admitting: Internal Medicine

## 2014-10-01 LAB — MDC_IDC_ENUM_SESS_TYPE_REMOTE
Battery Remaining Longevity: 136 mo
Battery Voltage: 3.12 V
Brady Statistic RV Percent Paced: 0.23 %
Date Time Interrogation Session: 20151207171430
HighPow Impedance: 46 Ohm
Lead Channel Impedance Value: 323 Ohm
Lead Channel Pacing Threshold Pulse Width: 0.4 ms
Lead Channel Setting Sensing Sensitivity: 0.3 mV
MDC IDC MSMT LEADCHNL RV IMPEDANCE VALUE: 342 Ohm
MDC IDC MSMT LEADCHNL RV PACING THRESHOLD AMPLITUDE: 0.875 V
MDC IDC MSMT LEADCHNL RV SENSING INTR AMPL: 7.875 mV
MDC IDC SET LEADCHNL RV PACING AMPLITUDE: 2.5 V
MDC IDC SET LEADCHNL RV PACING PULSEWIDTH: 0.4 ms
MDC IDC SET ZONE DETECTION INTERVAL: 320 ms
Zone Setting Detection Interval: 340 ms
Zone Setting Detection Interval: 400 ms

## 2014-10-06 ENCOUNTER — Ambulatory Visit: Payer: Medicare Other | Admitting: Cardiology

## 2014-10-08 ENCOUNTER — Encounter: Payer: Self-pay | Admitting: Cardiology

## 2014-10-13 ENCOUNTER — Other Ambulatory Visit: Payer: Self-pay | Admitting: Cardiology

## 2014-10-14 ENCOUNTER — Other Ambulatory Visit: Payer: Self-pay | Admitting: Cardiology

## 2014-10-18 ENCOUNTER — Encounter: Payer: Self-pay | Admitting: Internal Medicine

## 2014-10-27 ENCOUNTER — Ambulatory Visit (INDEPENDENT_AMBULATORY_CARE_PROVIDER_SITE_OTHER): Payer: Medicare PPO | Admitting: Cardiology

## 2014-10-27 ENCOUNTER — Encounter: Payer: Self-pay | Admitting: Cardiology

## 2014-10-27 VITALS — BP 124/56 | HR 61 | Ht 66.0 in | Wt 189.0 lb

## 2014-10-27 DIAGNOSIS — I119 Hypertensive heart disease without heart failure: Secondary | ICD-10-CM

## 2014-10-27 DIAGNOSIS — I255 Ischemic cardiomyopathy: Secondary | ICD-10-CM

## 2014-10-27 DIAGNOSIS — E7800 Pure hypercholesterolemia, unspecified: Secondary | ICD-10-CM

## 2014-10-27 NOTE — Progress Notes (Signed)
Gregory [name] Carney [address] [place], [place]  [address] [phone]        Fax   [phone]   History of Present Illness: This pleasant 81 year old gentleman is seen for a six-month followup office visit. Gregory [name] Kitchen He has a past history of ischemic cardiomyopathy. He has chronic systolic congestive heart failure. He has a history of previous coronary bypass graft surgery. He has an ICD in place.  He had an ICD generator change on 03/11/14.  He has a Medtronic single chamber device. He has a history of diabetes mellitus. His ejection fraction by stress test in June 2010 was 37%.  His last echocardiogram on 02/16/14 showed an ejection fraction of 25-30%.  There was mild mitral regurgitation.  There was evidence of a high diastolic filling pressures with diastolic mitral regurgitation and with an E./E. prime ratio of more than 25. The patient is on long-term low-dose amiodarone because of previous ventricular tachycardia. The patient has not been experiencing any exertional chest pain or angina pectoris. The patient had been experiencing some low blood sugar reactions.  His PCP Dr. Redmond Pulling had him stop his glimepiride 2 days ago and since then his blood sugars have been less depressed. The patient has a history of hypothyroidism.  He complains of being cold all the time.  He will mention this to Dr. Kathryne Eriksson at his next visit. The patient has not been having any exacerbation of CHF.  He sleeps on one thick pillow.  He does not have paroxysmal nocturnal dyspnea.  Current Outpatient Prescriptions  Medication Sig Dispense Refill  . acetaminophen (TYLENOL) 500 MG tablet Take 500 mg by mouth every 6 (six) hours as needed. For pain    . amiodarone (PACERONE) 200 MG tablet Take 0.5 tablets (100 mg total) by mouth every morning. 30 tablet 5  . aspirin EC 325 MG tablet Take 325 mg by mouth every morning.    Gregory [name] Kitchen atorvastatin (LIPITOR) 20 MG tablet  Take 20 mg by mouth daily.     . calcium carbonate (TUMS EX) 750 MG chewable tablet Chew 1 tablet by mouth daily as needed for heartburn.     . carvedilol (COREG) 12.5 MG tablet TAKE 1 TABLET BY MOUTH TWICE A DAY 60 tablet 5  . cetirizine (ZYRTEC) 10 MG tablet Take 10 mg by mouth daily.    . cholecalciferol (VITAMIN D) 1000 UNITS tablet Take 1,000 Units by mouth daily.    . diphenhydrAMINE (BENADRYL) 25 mg capsule Take 25 mg by mouth daily as needed for allergies.     Gregory [name] Kitchen dorzolamide-timolol (COSOPT) 22.3-6.8 MG/ML ophthalmic solution Place 1 drop into the left eye daily.    . ferrous sulfate 325 (65 FE) MG tablet Take 325 mg by mouth daily with breakfast.    . furosemide (LASIX) 40 MG tablet Take 1 tablet (40 mg total) by mouth daily. 30 tablet 5  . gabapentin (NEURONTIN) 300 MG capsule Take 600 mg by mouth at bedtime.     . Inulin (METAMUCIL CLEAR & NATURAL) POWD Take 15 mLs by mouth 3 (three) times a week.     . latanoprost (XALATAN) 0.005 % ophthalmic solution Place 1 drop into the left eye at bedtime.    Gregory [name] Kitchen levothyroxine (SYNTHROID, LEVOTHROID) 125 MCG tablet Take 125 mcg by mouth every morning.     . metFORMIN (GLUCOPHAGE) 500 MG tablet Take 500 mg by mouth 2 (two) times daily.     Gregory [name] Kitchen  Multiple Vitamins-Minerals (CENTRUM SILVER PO) Take 1 tablet by mouth daily.     . nitroGLYCERIN (NITROSTAT) 0.4 MG SL tablet Place 1 tablet (0.4 mg total) under the tongue every 5 (five) minutes as needed for chest pain. 25 tablet 5  . ramipril (ALTACE) 1.25 MG capsule TAKE 1 CAPSULE BY MOUTH EVERY DAY 30 capsule 5  . tamsulosin (FLOMAX) 0.4 MG CAPS capsule Take 0.4 mg by mouth daily after breakfast.     . temazepam (RESTORIL) 30 MG capsule Take 30 mg by mouth at bedtime as needed. For sleep     No current facility-administered medications for this visit.    No Known Allergies  Patient Active Problem List   Diagnosis Date Noted  . CAD 02/02/2008    Priority: High  . AUTOMATIC IMPLANTABLE CARDIAC  DEFIBRILLATOR SITU 03/15/2009    Priority: Medium  . CHF 02/02/2008    Priority: Medium  . 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 Constipation 05/18/2013  . Ischemic heart disease   . Myocardial infarction   . Hypothyroidism   . Diabetes mellitus   . Dyslipidemia   . Benign hypertensive heart disease without heart failure   . Ischemic cardiomyopathy   . Diabetic neuropathy   . HYPOTHYROIDISM 03/15/2009  . DYSLIPIDEMIA 03/15/2009  . DM 02/02/2008  . DEPRESSION 02/02/2008  . MI 02/02/2008    History  Smoking status  . Former Smoker  . Types: Cigarettes  . Quit date: 10/22/2004  Smokeless tobacco  . Never Used    Comment: quit ismoking pipe/cigar n 2006    History  Alcohol Use No    Family History  Problem Relation Age of Onset  . Colon cancer Neg Hx   . Esophageal cancer Neg Hx   . Rectal cancer Neg Hx   . Stomach cancer Neg Hx   . Diabetes Mother   . Prostate cancer Father   . Cancer Daughter     ? unknown, in stomach  . Diabetes      Review of Systems: Constitutional: no fever chills diaphoresis or fatigue or change in weight.  Head and neck: no hearing loss, no epistaxis, no photophobia or visual disturbance. Respiratory: No cough, shortness of breath or wheezing. Cardiovascular: No chest pain peripheral edema, palpitations. Gastrointestinal: No abdominal distention, no abdominal pain, no change in bowel habits hematochezia or melena. Genitourinary: No dysuria, no frequency, no urgency, no nocturia. Musculoskeletal:No arthralgias, no back pain, no gait disturbance or myalgias. Neurological: No dizziness, no headaches, no numbness, no seizures, no syncope, no weakness, no tremors. Hematologic: No lymphadenopathy, no easy bruising. Psychiatric: No confusion, no hallucinations, no sleep disturbance.    Physical Exam: Filed Vitals:   10/27/14 1414  BP: 124/56  Pulse: 61   the general appearance reveals a well-developed elderly  gentleman in no distress.  He wears a patch over his right eye.    The mouth and pharynx are normal.  The neck is supple.  The carotids reveal no bruits.  The jugular venous pressure is normal.  The  thyroid is not enlarged.  There is no lymphadenopathy.  The chest is clear to percussion and auscultation.  There are no rales or rhonchi.  Expansion of the chest is symmetrical.  There is a defibrillator in the left upper chest  The precordium is quiet.  The first heart sound is normal.  The second heart sound is physiologically split.  There is no murmur gallop rub or click.  There is no  abnormal lift or heave.  The abdomen is soft and nontender.  The bowel sounds are normal.  The liver and spleen are not enlarged.  There are no abdominal masses.  There are no abdominal bruits.  Extremities reveal good pedal pulses.  There is no phlebitis or edema.  There is no cyanosis or clubbing.  Strength is normal and symmetrical in all extremities.  There is no lateralizing weakness.  There are no sensory deficits.  The skin is warm and dry.  There is no rash.  EKG today shows normal sinus rhythm with marked first-degree AV block.  There is a pattern of bifascicular block.  Since prior tracing of 03/11/14, no significant change   Assessment / Plan: 1. ischemic heart disease with ischemic cardiomyopathy.  No recent chest pain or angina. 2. functioning Medtronic single-chamber ICD.  No discharges. 3. Hypercholesterolemia 4. benign hypertensive heart disease without heart failure.  Blood pressure stable on current therapy. 5 history of hypothyroidism, on Synthroid, followed by Dr. Kathryne Eriksson.  Patient complains of cold intolerance.  Plan: Continue same medication.  Recheck in 6 months for followup office visit.

## 2014-10-27 NOTE — Patient Instructions (Signed)
Your physician recommends that you continue on your current medications as directed. Please refer to the Current Medication list given to you today.  Your physician wants you to follow-up in: 6 month ov You will receive a reminder letter in the mail two months in advance. If you don't receive a letter, please call our office to schedule the follow-up appointment.  

## 2014-11-29 ENCOUNTER — Other Ambulatory Visit: Payer: Self-pay | Admitting: *Deleted

## 2014-11-29 MED ORDER — CARVEDILOL 12.5 MG PO TABS: 12.5000 mg | ORAL_TABLET | Freq: Two times a day (BID) | ORAL | Status: DC

## 2014-11-29 MED ORDER — AMIODARONE HCL 200 MG PO TABS: 100.0000 mg | ORAL_TABLET | Freq: Every morning | ORAL | Status: DC

## 2014-11-29 MED ORDER — FUROSEMIDE 40 MG PO TABS: 40.0000 mg | ORAL_TABLET | Freq: Every day | ORAL | Status: DC

## 2014-11-30 ENCOUNTER — Other Ambulatory Visit: Payer: Self-pay | Admitting: Cardiology

## 2014-11-30 MED ORDER — FUROSEMIDE 40 MG PO TABS: 40.0000 mg | ORAL_TABLET | Freq: Every day | ORAL | Status: DC

## 2014-11-30 MED ORDER — AMIODARONE HCL 200 MG PO TABS: 100.0000 mg | ORAL_TABLET | Freq: Every morning | ORAL | Status: DC

## 2014-11-30 MED ORDER — RAMIPRIL 1.25 MG PO CAPS: ORAL_CAPSULE | ORAL | Status: DC

## 2014-11-30 MED ORDER — CARVEDILOL 12.5 MG PO TABS: 12.5000 mg | ORAL_TABLET | Freq: Two times a day (BID) | ORAL | Status: DC

## 2014-12-13 ENCOUNTER — Other Ambulatory Visit: Payer: Self-pay | Admitting: *Deleted

## 2014-12-13 MED ORDER — RAMIPRIL 1.25 MG PO CAPS: ORAL_CAPSULE | ORAL | Status: DC

## 2014-12-27 ENCOUNTER — Ambulatory Visit (INDEPENDENT_AMBULATORY_CARE_PROVIDER_SITE_OTHER): Payer: PPO | Admitting: *Deleted

## 2014-12-27 DIAGNOSIS — I255 Ischemic cardiomyopathy: Secondary | ICD-10-CM

## 2014-12-27 DIAGNOSIS — I509 Heart failure, unspecified: Secondary | ICD-10-CM | POA: Diagnosis not present

## 2014-12-27 LAB — MDC_IDC_ENUM_SESS_TYPE_REMOTE
Battery Voltage: 3.08 V
HIGH POWER IMPEDANCE MEASURED VALUE: 45 Ohm
Lead Channel Pacing Threshold Pulse Width: 0.4 ms
Lead Channel Sensing Intrinsic Amplitude: 7.75 mV
Lead Channel Sensing Intrinsic Amplitude: 7.75 mV
Lead Channel Setting Pacing Amplitude: 2.5 V
Lead Channel Setting Pacing Pulse Width: 0.4 ms
MDC IDC MSMT BATTERY REMAINING LONGEVITY: 135 mo
MDC IDC MSMT LEADCHNL RV IMPEDANCE VALUE: 323 Ohm
MDC IDC MSMT LEADCHNL RV IMPEDANCE VALUE: 380 Ohm
MDC IDC MSMT LEADCHNL RV PACING THRESHOLD AMPLITUDE: 0.875 V
MDC IDC SESS DTM: 20160307153942
MDC IDC SET LEADCHNL RV SENSING SENSITIVITY: 0.3 mV
MDC IDC SET ZONE DETECTION INTERVAL: 340 ms
MDC IDC STAT BRADY RV PERCENT PACED: 0.27 %
Zone Setting Detection Interval: 320 ms
Zone Setting Detection Interval: 400 ms

## 2014-12-27 NOTE — Progress Notes (Signed)
Remote ICD transmission.   

## 2015-01-06 ENCOUNTER — Encounter: Payer: Self-pay | Admitting: Cardiology

## 2015-01-12 ENCOUNTER — Encounter: Payer: Self-pay | Admitting: Internal Medicine

## 2015-03-22 ENCOUNTER — Other Ambulatory Visit: Payer: Self-pay

## 2015-03-22 ENCOUNTER — Ambulatory Visit (INDEPENDENT_AMBULATORY_CARE_PROVIDER_SITE_OTHER): Payer: Medicare Other | Admitting: Internal Medicine

## 2015-03-22 ENCOUNTER — Encounter: Payer: Self-pay | Admitting: Internal Medicine

## 2015-03-22 VITALS — BP 114/42 | HR 58 | Ht 65.5 in | Wt 176.4 lb

## 2015-03-22 DIAGNOSIS — I255 Ischemic cardiomyopathy: Secondary | ICD-10-CM

## 2015-03-22 LAB — CUP PACEART INCLINIC DEVICE CHECK
Battery Remaining Longevity: 133 mo
Brady Statistic RV Percent Paced: 0.27 %
Date Time Interrogation Session: 20160531114836
HIGH POWER IMPEDANCE MEASURED VALUE: 54 Ohm
HighPow Impedance: 171 Ohm
Lead Channel Impedance Value: 380 Ohm
Lead Channel Pacing Threshold Amplitude: 1 V
Lead Channel Pacing Threshold Pulse Width: 0.4 ms
Lead Channel Setting Pacing Pulse Width: 0.4 ms
Lead Channel Setting Sensing Sensitivity: 0.3 mV
MDC IDC MSMT BATTERY VOLTAGE: 3.04 V
MDC IDC MSMT LEADCHNL RV SENSING INTR AMPL: 9.25 mV
MDC IDC SET LEADCHNL RV PACING AMPLITUDE: 2.5 V
MDC IDC SET ZONE DETECTION INTERVAL: 320 ms
MDC IDC SET ZONE DETECTION INTERVAL: 400 ms
Zone Setting Detection Interval: 340 ms

## 2015-03-22 NOTE — Progress Notes (Signed)
HPI Gregory [name] returns today for followup. He is a very pleasant 81 year old man with a history of chronic systolic heart failure, an ischemic cardiomyopathy after a large anterior myocardial infarction, status post ICD implantation. In 2009, his ICD lead fracture, and he underwent insertion of a new device. He has been stable in the interim except he fell and injured his leg. He has some residual swelling.  No fracture. He underwent ICD generator change out several months ago. Overall the patient is stable with no worsening heart there symptoms and no anginal symptoms. His heart failure is class II. No Known Allergies   Current Outpatient Prescriptions  Medication Sig Dispense Refill  . acetaminophen (TYLENOL) 500 MG tablet Take 500 mg by mouth every 6 (six) hours as needed. For pain    . amiodarone (PACERONE) 200 MG tablet Take 0.5 tablets (100 mg total) by mouth every morning. 45 tablet 1  . aspirin EC 325 MG tablet Take 325 mg by mouth every morning.    Marland Kitchen atorvastatin (LIPITOR) 20 MG tablet Take 20 mg by mouth daily.     . calcium carbonate (TUMS EX) 750 MG chewable tablet Chew 1 tablet by mouth daily as needed for heartburn.     . carvedilol (COREG) 12.5 MG tablet Take 1 tablet (12.5 mg total) by mouth 2 (two) times daily. 180 tablet 1  . cetirizine (ZYRTEC) 10 MG tablet Take 10 mg by mouth daily.    . cholecalciferol (VITAMIN D) 1000 UNITS tablet Take 1,000 Units by mouth daily.    . diphenhydrAMINE (BENADRYL) 25 mg capsule Take 25 mg by mouth daily as needed for allergies.     Marland Kitchen dorzolamide-timolol (COSOPT) 22.3-6.8 MG/ML ophthalmic solution Place 1 drop into the left eye daily.    . ferrous sulfate 325 (65 FE) MG tablet Take 325 mg by mouth daily with breakfast.    . furosemide (LASIX) 40 MG tablet Take 1 tablet (40 mg total) by mouth daily. 90 tablet 1  . gabapentin (NEURONTIN) 300 MG capsule Take 600 mg by mouth every evening.     . Inulin (METAMUCIL CLEAR & NATURAL) POWD Take  15 mLs by mouth 3 (three) times a week.     . latanoprost (XALATAN) 0.005 % ophthalmic solution Place 1 drop into the left eye at bedtime.    Marland Kitchen levothyroxine (SYNTHROID, LEVOTHROID) 125 MCG tablet Take 125 mcg by mouth every morning.     . metFORMIN (GLUCOPHAGE) 500 MG tablet Take 500 mg by mouth 2 (two) times daily.     . Multiple Vitamins-Minerals (CENTRUM SILVER PO) Take 1 tablet by mouth daily.     . nitroGLYCERIN (NITROSTAT) 0.4 MG SL tablet Place 1 tablet (0.4 mg total) under the tongue every 5 (five) minutes as needed for chest pain. 25 tablet 5  . ramipril (ALTACE) 1.25 MG capsule TAKE 1 CAPSULE BY MOUTH EVERY DAY 90 capsule 2  . tamsulosin (FLOMAX) 0.4 MG CAPS capsule Take 0.4 mg by mouth daily after breakfast.     . temazepam (RESTORIL) 30 MG capsule Take 30 mg by mouth at bedtime as needed. For sleep     No current facility-administered medications for this visit.     Past Medical History  Diagnosis Date  . Ischemic heart disease   . Hypothyroidism   . Diabetes mellitus   . Dyslipidemia   . Essential hypertension   . Ischemic cardiomyopathy   . Diabetic neuropathy   . Myocardial infarction 2006  LARGE ANTERIOR WALL  . Diverticulosis   . Colon polyps   . Internal hemorrhoid   . Allergy   . Cataract   . Glaucoma   . CHF (congestive heart failure)   . Hyperlipidemia   . ICD (implantable cardiac defibrillator) in place 11-2004    ROS:   All systems reviewed and negative except as noted in the HPI.   Past Surgical History  Procedure Laterality Date  . Coronary artery bypass graft  12/02/04  . Cardiac defibrillator placement    . Cholecystectomy, laparoscopic    . Eye surgery      SOCKET SURGERY-BORN WITHOUT RIGHT EYE  . Shoulder surgery      RIGHT  . Cataract extraction      left  . Cholecystectomy    . Tonsillectomy    . Esophagogastroduodenoscopy N/A 12/01/2012    Procedure: ESOPHAGOGASTRODUODENOSCOPY (EGD);  Surgeon: Irene Shipper, MD;  Location: [facility];  Service: Endoscopy;  Laterality: N/A;  . Colonoscopy N/A 12/01/2012    Procedure: COLONOSCOPY;  Surgeon: Irene Shipper, MD;  Location: [facility];  Service: Endoscopy;  Laterality: N/A;  . Implantable cardioverter defibrillator (icd) generator change N/A 03/11/2014    Procedure: ICD GENERATOR CHANGE;  Surgeon: Evans Lance, MD;  [facility]: [facility];  Service: Cardiovascular;  Laterality: N/A;     Family History  Problem Relation Age of Onset  . Colon cancer Neg Hx   . Esophageal cancer Neg Hx   . Rectal cancer Neg Hx   . Stomach cancer Neg Hx   . Diabetes Mother   . Prostate cancer Father   . Cancer Daughter     ? unknown, in stomach  . Diabetes       History   Social History  . Marital Status: Married    Spouse Name: N/A  . Number of Children: 1  . Years of Education: N/A   Occupational History  . retired    Social History Main Topics  . Smoking status: Former Smoker    Types: Cigarettes    Quit date: 10/22/2004  . Smokeless tobacco: Never Used     Comment: quit smoking pipe/cigar in 2006  . Alcohol Use: No  . Drug Use: No  . Sexual Activity: Not on file   Other Topics Concern  . Not on file   Social History Narrative     BP 114/42 mmHg  Pulse 58  Ht 5' 5.5" (1.664 m)  Wt 176 lb 6.4 oz (80.015 kg)  BMI 28.90 kg/m2  Physical Exam:  Well appearing 81 year old man, NAD HEENT: Unremarkable, except for a bandage over the right eye, no underlying right eye present Neck:  No JVD, no thyromegally Back:  No CVA tenderness Lungs:  Clear with no wheezes, rales, or rhonchi. Well healed PPM incision. HEART:  Regular rate rhythm, no murmurs, no rubs, no clicks Abd:  soft, positive bowel sounds, no organomegally, no rebound, no guarding Ext:  2 plus pulses, no edema, no cyanosis, no clubbing Skin:  No rashes no nodules Neuro:  CN II through XII intact, motor grossly intact   DEVICE  Device interrogation demonstrates normal device function.    Assess/Plan:

## 2015-03-22 NOTE — Patient Instructions (Signed)
Medication Instructions:  Your physician recommends that you continue on your current medications as directed. Please refer to the Current Medication list given to you today.   Labwork: None ordered  Testing/Procedures: None ordered  Follow-Up: Your physician wants you to follow-up in: 12 months with Dr Knox Saliva will receive a reminder letter in the mail two months in advance. If you don't receive a letter, please call our office to schedule the follow-up appointment.  Remote monitoring is used to monitor your Pacemaker or ICD from home. This monitoring reduces the number of office visits required to check your device to one time per year. It allows [place] to keep an eye on the functioning of your device to ensure it is working properly. You are scheduled for a device check from home on 06/21/15. You may send your transmission at any time that day. If you have a wireless device, the transmission will be sent automatically. After your physician reviews your transmission, you will receive a postcard with your next transmission date.     Any Other Special Instructions Will Be Listed Below (If Applicable).

## 2015-03-28 ENCOUNTER — Telehealth: Payer: Self-pay | Admitting: Cardiology

## 2015-03-28 NOTE — Telephone Encounter (Signed)
New message      Pt is having night sweats/day sweats.  Last time he had a heart attacks, he had night sweats.  Pt has no other symptoms but he is concerned.  Please call

## 2015-03-28 NOTE — Telephone Encounter (Signed)
Spoke with patient regarding night and day sweats Patient states he has been having them for about 3 weeks Denies any other symptoms (no pain of any kind, no chest pressure, or shortness of breath) Discussed with  Dr. Mare Ferrari and will see him in office later this week Appointment given to patient

## 2015-03-29 ENCOUNTER — Encounter: Payer: Self-pay | Admitting: *Deleted

## 2015-03-29 ENCOUNTER — Other Ambulatory Visit: Payer: Self-pay | Admitting: *Deleted

## 2015-03-29 MED ORDER — RAMIPRIL 1.25 MG PO CAPS: ORAL_CAPSULE | ORAL | Status: DC

## 2015-03-31 ENCOUNTER — Ambulatory Visit (INDEPENDENT_AMBULATORY_CARE_PROVIDER_SITE_OTHER): Payer: Medicare Other | Admitting: Cardiology

## 2015-03-31 ENCOUNTER — Encounter: Payer: Self-pay | Admitting: Cardiology

## 2015-03-31 ENCOUNTER — Ambulatory Visit
Admission: RE | Admit: 2015-03-31 | Discharge: 2015-03-31 | Disposition: A | Discharge status: Home / Self Care | Payer: Medicare Other | Source: Ambulatory Visit | Attending: Cardiology | Admitting: Cardiology

## 2015-03-31 VITALS — BP 124/48 | HR 57 | Ht 66.5 in | Wt 178.0 lb

## 2015-03-31 DIAGNOSIS — I119 Hypertensive heart disease without heart failure: Secondary | ICD-10-CM

## 2015-03-31 DIAGNOSIS — E7800 Pure hypercholesterolemia, unspecified: Secondary | ICD-10-CM

## 2015-03-31 DIAGNOSIS — R61 Generalized hyperhidrosis: Secondary | ICD-10-CM | POA: Diagnosis not present

## 2015-03-31 DIAGNOSIS — I255 Ischemic cardiomyopathy: Secondary | ICD-10-CM

## 2015-03-31 NOTE — Progress Notes (Signed)
Cardiology Office Note   Date:  03/31/2015   ID:  Gregory [name], DOB 02/08/1933, MRN [id number]  PCP:  Woody Seller, MD  Cardiologist: Darlin Coco MD  No chief complaint on file.     History of Present Illness: Gregory [name] is a 81 y.o. male who presents for a six-month follow-up office visit.  This pleasant 81 year old gentleman is seen for a six-month followup office visit. Marland Kitchen He has a past history of ischemic cardiomyopathy. He has chronic systolic congestive heart failure. He has a history of previous coronary bypass graft surgery. He has an ICD in place. He had an ICD generator change on 03/11/14. He has a Medtronic single chamber device. He has a history of diabetes mellitus. His ejection fraction by stress test in June 2010 was 37%. His last echocardiogram on 02/16/14 showed an ejection fraction of 25-30%. There was mild mitral regurgitation. There was evidence of a high diastolic filling pressures with diastolic mitral regurgitation and with an E./E. prime ratio of more than 25. The patient is on long-term low-dose amiodarone because of previous ventricular tachycardia. The patient has not been experiencing any exertional chest pain or angina pectoris.  The patient has a history of hypothyroidism. Recently for the past month he has been having some intermittent night sweats but no fever.  He has had a slight nonproductive cough. The patient has not been having any exacerbation of CHF. He sleeps on one thick pillow. He does not have paroxysmal nocturnal dyspnea.  Past Medical History  Diagnosis Date  . Ischemic heart disease   . Hypothyroidism   . Diabetes mellitus   . Dyslipidemia   . Essential hypertension   . Ischemic cardiomyopathy   . Diabetic neuropathy   . Myocardial infarction 2006    LARGE ANTERIOR WALL  . Diverticulosis   . Colon polyps   . Internal hemorrhoid   . Allergy   . Cataract   . Glaucoma   . CHF (congestive heart failure)     . Hyperlipidemia   . ICD (implantable cardiac defibrillator) in place 11-2004    Past Surgical History  Procedure Laterality Date  . Coronary artery bypass graft  12/02/04  . Cardiac defibrillator placement    . Cholecystectomy, laparoscopic    . Eye surgery      SOCKET SURGERY-BORN WITHOUT RIGHT EYE  . Shoulder surgery      RIGHT  . Cataract extraction      left  . Cholecystectomy    . Tonsillectomy    . Esophagogastroduodenoscopy N/A 12/01/2012    Procedure: ESOPHAGOGASTRODUODENOSCOPY (EGD);  Surgeon: Irene Shipper, MD;  Location: [facility];  Service: Endoscopy;  Laterality: N/A;  . Colonoscopy N/A 12/01/2012    Procedure: COLONOSCOPY;  Surgeon: Irene Shipper, MD;  Location: [facility];  Service: Endoscopy;  Laterality: N/A;  . Implantable cardioverter defibrillator (icd) generator change N/A 03/11/2014    Procedure: ICD GENERATOR CHANGE;  Surgeon: Evans Lance, MD;  Location: [facility];  Service: Cardiovascular;  Laterality: N/A;     Current Outpatient Prescriptions  Medication Sig Dispense Refill  . acetaminophen (TYLENOL) 500 MG tablet Take 500 mg by mouth every 6 (six) hours as needed. For pain    . amiodarone (PACERONE) 200 MG tablet Take 0.5 tablets (100 mg total) by mouth every morning. 45 tablet 1  . aspirin EC 325 MG tablet Take 325 mg by mouth every morning.    Marland Kitchen atorvastatin (LIPITOR) 20 MG tablet Take 20  mg by mouth daily.     . calcium carbonate (TUMS EX) 750 MG chewable tablet Chew 1 tablet by mouth daily as needed for heartburn.     . carvedilol (COREG) 12.5 MG tablet Take 1 tablet (12.5 mg total) by mouth 2 (two) times daily. 180 tablet 1  . cetirizine (ZYRTEC) 10 MG tablet Take 10 mg by mouth daily.    . cholecalciferol (VITAMIN D) 1000 UNITS tablet Take 1,000 Units by mouth daily.    . diphenhydrAMINE (BENADRYL) 25 mg capsule Take 25 mg by mouth daily as needed for allergies.     Marland Kitchen dorzolamide-timolol (COSOPT) 22.3-6.8 MG/ML ophthalmic solution Place 1  drop into the left eye daily.    . ferrous sulfate 325 (65 FE) MG tablet Take 325 mg by mouth daily with breakfast.    . furosemide (LASIX) 40 MG tablet Take 1 tablet (40 mg total) by mouth daily. 90 tablet 1  . gabapentin (NEURONTIN) 300 MG capsule Take 600 mg by mouth every evening.     . Inulin (METAMUCIL CLEAR & NATURAL) POWD Take 15 mLs by mouth 3 (three) times a week.     . latanoprost (XALATAN) 0.005 % ophthalmic solution Place 1 drop into the left eye at bedtime.    Marland Kitchen levothyroxine (SYNTHROID, LEVOTHROID) 125 MCG tablet Take 125 mcg by mouth every morning.     . metFORMIN (GLUCOPHAGE) 500 MG tablet Take 500 mg by mouth 2 (two) times daily.     . Multiple Vitamins-Minerals (CENTRUM SILVER PO) Take 1 tablet by mouth daily.     . nitroGLYCERIN (NITROSTAT) 0.4 MG SL tablet Place 1 tablet (0.4 mg total) under the tongue every 5 (five) minutes as needed for chest pain. 25 tablet 5  . ramipril (ALTACE) 1.25 MG capsule TAKE 1 CAPSULE BY MOUTH EVERY DAY 90 capsule 0  . tamsulosin (FLOMAX) 0.4 MG CAPS capsule Take 0.4 mg by mouth daily after breakfast.     . temazepam (RESTORIL) 30 MG capsule Take 30 mg by mouth at bedtime as needed. For sleep     No current facility-administered medications for this visit.    Allergies:   Review of patient's allergies indicates no known allergies.    Social History:  The patient  reports that he quit smoking about 10 years ago. His smoking use included Cigarettes. He has never used smokeless tobacco. He reports that he does not drink alcohol or use illicit drugs.   Family History:  The patient's family history includes Cancer in his daughter; Diabetes in his mother and another family member; Prostate cancer in his father. There is no history of Colon cancer, Esophageal cancer, Rectal cancer, or Stomach cancer.    ROS:  Please see the history of present illness.   Otherwise, review of systems are positive for none.   All other systems are reviewed and  negative.    PHYSICAL EXAM: VS:  BP 124/48 mmHg  Pulse 57  Ht 5' 6.5" (1.689 m)  Wt 178 lb (80.74 kg)  BMI 28.30 kg/m2 , BMI Body mass index is 28.3 kg/(m^2). GEN: Well nourished, well developed, in no acute distress HEENT: normal Neck: no JVD, carotid bruits, or masses Cardiac: RRR; no murmurs, rubs, or gallops,no edema  Respiratory:  clear to auscultation bilaterally, normal work of breathing GI: soft, nontender, nondistended, + BS MS: no deformity or atrophy Skin: warm and dry, no rash Neuro:  Strength and sensation are intact Psych: euthymic mood, full affect   EKG:  EKG  is ordered today. The ekg ordered today demonstrates sinus bradycardia with first-degree AV block, bifascicular block, old inferior MI and old anterolateral MI.  Since previous tracing of 10/27/14, no significant change   Recent Labs: 06/23/2014: BUN 20; Creatinine, Ser 1.9*; Potassium 5.1; Sodium 137    Lipid Panel    Component Value Date/Time   CHOL 140 12/31/2011 1220   TRIG 343.0* 12/31/2011 1220   HDL 41.40 12/31/2011 1220   CHOLHDL 3 12/31/2011 1220   VLDL 68.6* 12/31/2011 1220   LDLCALC 47 02/27/2010   LDLDIRECT 57.2 12/31/2011 1220      Wt Readings from Last 3 Encounters:  03/31/15 178 lb (80.74 kg)  03/22/15 176 lb 6.4 oz (80.015 kg)  10/27/14 189 lb (85.73 kg)        ASSESSMENT AND PLAN:  1. ischemic heart disease with ischemic cardiomyopathy. No recent chest pain or angina. 2. functioning Medtronic single-chamber ICD. No discharges. 3. Hypercholesterolemia 4. benign hypertensive heart disease without heart failure. Blood pressure stable on current therapy. 5 history of hypothyroidism, on Synthroid, followed by Dr. Kathryne Eriksson. Patient complains of cold intolerance.  Recently he has also been having night sweats.  Plan: Continue same medication. Recheck in 6 months for followup office visit.  We are checking a chest x-ray today because of his slight cough and his night  sweats.   Current medicines are reviewed at length with the patient today.  The patient does not have concerns regarding medicines.  The following changes have been made:  no change  Labs/ tests ordered today include:   Orders Placed This Encounter  Procedures  . DG Chest 2 View  . EKG 12-Lead     Disposition: Recheck in 6 months for office visit and EKG.  Continue current medication.  Berna Spare MD 03/31/2015 3:10 PM    [facility] [address], [place], [place]  [address] Phone: [phone]; Fax: [phone]

## 2015-03-31 NOTE — Patient Instructions (Signed)
Medication Instructions:  Your physician recommends that you continue on your current medications as directed. Please refer to the Current Medication list given to you today.  Labwork: NONE  Testing/Procedures: A chest x-ray takes a picture of the organs and structures inside the chest, including the heart, lungs, and blood vessels. This test can show several things, including, whether the heart is enlarges; whether fluid is building up in the lungs; and whether pacemaker / defibrillator leads are still in place. [facility] [place]  Follow-Up: Your physician wants you to follow-up in: Between will receive a reminder letter in the mail two months in advance. If you don't receive a letter, please call our office to schedule the follow-up appointment.

## 2015-04-01 ENCOUNTER — Other Ambulatory Visit: Payer: Self-pay

## 2015-04-01 MED ORDER — CARVEDILOL 12.5 MG PO TABS: 12.5000 mg | ORAL_TABLET | Freq: Two times a day (BID) | ORAL | Status: DC

## 2015-04-11 ENCOUNTER — Other Ambulatory Visit: Payer: Self-pay

## 2015-04-11 MED ORDER — AMIODARONE HCL 200 MG PO TABS: 100.0000 mg | ORAL_TABLET | Freq: Every morning | ORAL | Status: DC

## 2015-05-02 ENCOUNTER — Ambulatory Visit: Payer: Medicare Other | Admitting: Cardiology

## 2015-06-21 ENCOUNTER — Ambulatory Visit (INDEPENDENT_AMBULATORY_CARE_PROVIDER_SITE_OTHER): Payer: Medicare Other | Admitting: *Deleted

## 2015-06-21 DIAGNOSIS — I509 Heart failure, unspecified: Secondary | ICD-10-CM

## 2015-06-21 DIAGNOSIS — I255 Ischemic cardiomyopathy: Secondary | ICD-10-CM | POA: Diagnosis not present

## 2015-06-21 NOTE — Progress Notes (Signed)
Remote ICD transmission.   

## 2015-06-25 ENCOUNTER — Other Ambulatory Visit: Payer: Self-pay | Admitting: Cardiology

## 2015-06-29 LAB — CUP PACEART REMOTE DEVICE CHECK
Battery Remaining Longevity: 132 mo
Brady Statistic RV Percent Paced: 0.1 %
Date Time Interrogation Session: 20160830062824
HIGH POWER IMPEDANCE MEASURED VALUE: 52 Ohm
Lead Channel Impedance Value: 342 Ohm
Lead Channel Pacing Threshold Pulse Width: 0.4 ms
Lead Channel Sensing Intrinsic Amplitude: 7.625 mV
Lead Channel Setting Pacing Amplitude: 2.5 V
MDC IDC MSMT BATTERY VOLTAGE: 3.04 V
MDC IDC MSMT LEADCHNL RV IMPEDANCE VALUE: 304 Ohm
MDC IDC MSMT LEADCHNL RV PACING THRESHOLD AMPLITUDE: 0.75 V
MDC IDC MSMT LEADCHNL RV SENSING INTR AMPL: 7.625 mV
MDC IDC SET LEADCHNL RV PACING PULSEWIDTH: 0.4 ms
MDC IDC SET LEADCHNL RV SENSING SENSITIVITY: 0.3 mV
MDC IDC SET ZONE DETECTION INTERVAL: 320 ms
Zone Setting Detection Interval: 340 ms
Zone Setting Detection Interval: 400 ms

## 2015-07-06 ENCOUNTER — Encounter: Payer: Self-pay | Admitting: Cardiology

## 2015-07-14 ENCOUNTER — Encounter: Payer: Self-pay | Admitting: Internal Medicine

## 2015-09-22 ENCOUNTER — Other Ambulatory Visit: Payer: Self-pay | Admitting: Cardiology

## 2015-09-22 ENCOUNTER — Ambulatory Visit (INDEPENDENT_AMBULATORY_CARE_PROVIDER_SITE_OTHER): Payer: Medicare Other | Admitting: *Deleted

## 2015-09-22 DIAGNOSIS — I255 Ischemic cardiomyopathy: Secondary | ICD-10-CM | POA: Diagnosis not present

## 2015-09-23 NOTE — Progress Notes (Signed)
Remote ICD transmission.   

## 2015-09-29 ENCOUNTER — Other Ambulatory Visit: Payer: Self-pay | Admitting: Cardiology

## 2015-09-30 ENCOUNTER — Ambulatory Visit: Payer: Medicare Other | Admitting: Cardiology

## 2015-10-03 LAB — CUP PACEART REMOTE DEVICE CHECK
Brady Statistic RV Percent Paced: 0.77 %
HighPow Impedance: 53 Ohm
Implantable Lead Location: 753860
Lead Channel Impedance Value: 304 Ohm
Lead Channel Impedance Value: 342 Ohm
Lead Channel Pacing Threshold Amplitude: 0.875 V
Lead Channel Setting Pacing Pulse Width: 0.4 ms
Lead Channel Setting Sensing Sensitivity: 0.3 mV
MDC IDC LEAD IMPLANT DT: 20090204
MDC IDC LEAD MODEL: 6935
MDC IDC MSMT BATTERY REMAINING LONGEVITY: 130 mo
MDC IDC MSMT BATTERY VOLTAGE: 3.02 V
MDC IDC MSMT LEADCHNL RV PACING THRESHOLD PULSEWIDTH: 0.4 ms
MDC IDC MSMT LEADCHNL RV SENSING INTR AMPL: 7.625 mV
MDC IDC MSMT LEADCHNL RV SENSING INTR AMPL: 7.625 mV
MDC IDC SESS DTM: 20161201051703
MDC IDC SET LEADCHNL RV PACING AMPLITUDE: 2.5 V

## 2015-10-04 ENCOUNTER — Encounter: Payer: Self-pay | Admitting: Cardiology

## 2015-10-26 DIAGNOSIS — Z23 Encounter for immunization: Secondary | ICD-10-CM | POA: Diagnosis not present

## 2015-10-26 DIAGNOSIS — K30 Functional dyspepsia: Secondary | ICD-10-CM | POA: Diagnosis not present

## 2015-10-26 DIAGNOSIS — E039 Hypothyroidism, unspecified: Secondary | ICD-10-CM | POA: Diagnosis not present

## 2015-10-26 DIAGNOSIS — G4700 Insomnia, unspecified: Secondary | ICD-10-CM | POA: Diagnosis not present

## 2015-10-26 DIAGNOSIS — E119 Type 2 diabetes mellitus without complications: Secondary | ICD-10-CM | POA: Diagnosis not present

## 2015-10-26 DIAGNOSIS — N289 Disorder of kidney and ureter, unspecified: Secondary | ICD-10-CM | POA: Diagnosis not present

## 2015-10-26 DIAGNOSIS — E785 Hyperlipidemia, unspecified: Secondary | ICD-10-CM | POA: Diagnosis not present

## 2015-10-26 DIAGNOSIS — Z0001 Encounter for general adult medical examination with abnormal findings: Secondary | ICD-10-CM | POA: Diagnosis not present

## 2015-11-23 ENCOUNTER — Encounter: Payer: Self-pay | Admitting: Cardiology

## 2015-11-23 ENCOUNTER — Ambulatory Visit (INDEPENDENT_AMBULATORY_CARE_PROVIDER_SITE_OTHER): Payer: PPO | Admitting: Cardiology

## 2015-11-23 VITALS — BP 110/50 | HR 56 | Ht 66.0 in | Wt 176.4 lb

## 2015-11-23 DIAGNOSIS — E7800 Pure hypercholesterolemia, unspecified: Secondary | ICD-10-CM

## 2015-11-23 DIAGNOSIS — I119 Hypertensive heart disease without heart failure: Secondary | ICD-10-CM | POA: Diagnosis not present

## 2015-11-23 DIAGNOSIS — I255 Ischemic cardiomyopathy: Secondary | ICD-10-CM | POA: Diagnosis not present

## 2015-11-23 NOTE — Progress Notes (Signed)
Cardiology Office Note   Date:  11/23/2015   ID:  Gregory [name], DOB 1932-11-27, MRN [id number]  PCP:  Woody Seller, MD  Cardiologist: Darlin Coco MD  Chief Complaint  Patient presents with  . routine follow up  . Shortness of Breath    with activity. denies chest pain,claudication, or le edema      History of Present Illness: Gregory [name] is a 82 y.o. male who presents for a six-month follow-up visit.  This pleasant 82 year old gentleman is seen for a six-month followup office visit. Marland Kitchen He has a past history of ischemic cardiomyopathy. He has chronic systolic congestive heart failure. He has a history of previous coronary bypass graft surgery. He has an ICD in place. He had an ICD generator change on 03/11/14. He has a Medtronic single chamber device. He has a history of diabetes mellitus. His ejection fraction by stress test in June 2010 was 37%. His last echocardiogram on 02/16/14 showed an ejection fraction of 25-30%. There was mild mitral regurgitation. There was evidence of a high diastolic filling pressures with diastolic mitral regurgitation and with an E./E. prime ratio of more than 25. The patient is on long-term low-dose amiodarone because of previous ventricular tachycardia. The patient has not been experiencing any exertional chest pain or angina pectoris.  The patient has not been having any exacerbation of CHF. He sleeps on one thick pillow. He does not have paroxysmal nocturnal dyspnea. The patient has a history of diabetes.  He tells me that he has an appointment soon to see endocrinologist Dr. Soyla Murphy.  Past Medical History  Diagnosis Date  . Ischemic heart disease   . Hypothyroidism   . Diabetes mellitus   . Dyslipidemia   . Essential hypertension   . Ischemic cardiomyopathy   . Diabetic neuropathy ([place])   . Myocardial infarction ([place]) 2006    LARGE ANTERIOR WALL  . Diverticulosis   . Colon polyps   . Internal hemorrhoid   . Allergy     . Cataract   . Glaucoma   . CHF (congestive heart failure) ([place])   . Hyperlipidemia   . ICD (implantable cardiac defibrillator) in place 11-2004    Past Surgical History  Procedure Laterality Date  . Coronary artery bypass graft  12/02/04  . Cardiac defibrillator placement    . Cholecystectomy, laparoscopic    . Eye surgery      SOCKET SURGERY-BORN WITHOUT RIGHT EYE  . Shoulder surgery      RIGHT  . Cataract extraction      left  . Cholecystectomy    . Tonsillectomy    . Esophagogastroduodenoscopy N/A 12/01/2012    Procedure: ESOPHAGOGASTRODUODENOSCOPY (EGD);  Surgeon: Irene Shipper, MD;  Location: [facility];  Service: Endoscopy;  Laterality: N/A;  . Colonoscopy N/A 12/01/2012    Procedure: COLONOSCOPY;  Surgeon: Irene Shipper, MD;  Location: [facility];  Service: Endoscopy;  Laterality: N/A;  . Implantable cardioverter defibrillator (icd) generator change N/A 03/11/2014    Procedure: ICD GENERATOR CHANGE;  Surgeon: Evans Lance, MD;  Location: [facility] CATH LAB;  Service: Cardiovascular;  Laterality: N/A;     Current Outpatient Prescriptions  Medication Sig Dispense Refill  . acetaminophen (TYLENOL) 500 MG tablet Take 500 mg by mouth every 6 (six) hours as needed. For pain    . amiodarone (PACERONE) 200 MG tablet Take 0.5 tablets (100 mg total) by mouth every morning. 45 tablet 3  . aspirin EC 325 MG tablet Take 325  mg by mouth every morning.    Marland Kitchen atorvastatin (LIPITOR) 20 MG tablet Take 20 mg by mouth daily.     . calcium carbonate (TUMS EX) 750 MG chewable tablet Chew 1 tablet by mouth daily as needed for heartburn.     . carvedilol (COREG) 12.5 MG tablet TAKE 1 TABLET (12.5 MG TOTAL) BY MOUTH 2 (TWO) TIMES DAILY. 180 tablet 1  . cetirizine (ZYRTEC) 10 MG tablet Take 10 mg by mouth daily.    . cholecalciferol (VITAMIN D) 1000 UNITS tablet Take 1,000 Units by mouth daily.    . diphenhydrAMINE (BENADRYL) 25 mg capsule Take 25 mg by mouth daily as needed for allergies.     Marland Kitchen  dorzolamide-timolol (COSOPT) 22.3-6.8 MG/ML ophthalmic solution Place 1 drop into the left eye daily.    . ferrous sulfate 325 (65 FE) MG tablet Take 325 mg by mouth daily with breakfast.    . furosemide (LASIX) 40 MG tablet Take 1 tablet (40 mg total) by mouth daily. 90 tablet 1  . gabapentin (NEURONTIN) 300 MG capsule Take 600 mg by mouth every evening.     . Inulin (METAMUCIL CLEAR & NATURAL) POWD Take 15 mLs by mouth 3 (three) times a week.     . latanoprost (XALATAN) 0.005 % ophthalmic solution Place 1 drop into the left eye at bedtime.    . metFORMIN (GLUCOPHAGE) 500 MG tablet Take 500 mg by mouth 2 (two) times daily.     . Multiple Vitamins-Minerals (CENTRUM SILVER PO) Take 1 tablet by mouth daily.     . nitroGLYCERIN (NITROSTAT) 0.4 MG SL tablet Place 1 tablet (0.4 mg total) under the tongue every 5 (five) minutes as needed for chest pain. 25 tablet 5  . promethazine (PHENERGAN) 25 MG tablet Take 25 mg by mouth every 4 (four) hours as needed. nausea  0  . ramipril (ALTACE) 1.25 MG capsule TAKE 1 CAPSULE BY MOUTH EVERY DAY 90 capsule 1  . SYNTHROID 137 MCG tablet Take 137 mcg by mouth daily.  0  . tamsulosin (FLOMAX) 0.4 MG CAPS capsule Take 0.4 mg by mouth daily after breakfast.     . temazepam (RESTORIL) 30 MG capsule Take 30 mg by mouth at bedtime as needed. For sleep     No current facility-administered medications for this visit.    Allergies:   Review of patient's allergies indicates no known allergies.    Social History:  The patient  reports that he quit smoking about 11 years ago. His smoking use included Cigarettes. He has never used smokeless tobacco. He reports that he does not drink alcohol or use illicit drugs.   Family History:  The patient's family history includes Cancer in his daughter; Diabetes in his mother; Prostate cancer in his father. There is no history of Colon cancer, Esophageal cancer, Rectal cancer, or Stomach cancer.    ROS:  Please see the history of  present illness.   Otherwise, review of systems are positive for none.   All other systems are reviewed and negative.    PHYSICAL EXAM: VS:  BP 110/50 mmHg  Pulse 56  Ht 5\' 6"  (1.676 m)  Wt 176 lb 6.4 oz (80.015 kg)  BMI 28.49 kg/m2 , BMI Body mass index is 28.49 kg/(m^2). GEN: Well nourished, well developed, in no acute distress HEENT: normal Neck: no JVD, carotid bruits, or masses Cardiac: RRR; no murmurs, rubs, or gallops,no edema  Respiratory:  clear to auscultation bilaterally, normal work of breathing GI: soft, nontender,  nondistended, + BS MS: no deformity or atrophy Skin: warm and dry, no rash Neuro:  Strength and sensation are intact Psych: euthymic mood, full affect   EKG:  EKG is ordered today. The ekg ordered today demonstrates P waves are difficult to see.  He appears to be in normal sinus rhythm with first-degree block.  Wide QRS.  Old inferior and old lateral myocardial infarction.   Recent Labs: No results found for requested labs within last 365 days.    Lipid Panel    Component Value Date/Time   CHOL 140 12/31/2011 1220   TRIG 343.0* 12/31/2011 1220   HDL 41.40 12/31/2011 1220   CHOLHDL 3 12/31/2011 1220   VLDL 68.6* 12/31/2011 1220   LDLCALC 47 02/27/2010   LDLDIRECT 57.2 12/31/2011 1220      Wt Readings from Last 3 Encounters:  11/23/15 176 lb 6.4 oz (80.015 kg)  03/31/15 178 lb (80.74 kg)  03/22/15 176 lb 6.4 oz (80.015 kg)         ASSESSMENT AND PLAN:  1. ischemic heart disease with ischemic cardiomyopathy. No recent chest pain or angina. 2. functioning Medtronic single-chamber ICD. No recent discharges. 3. Hypercholesterolemia 4. benign hypertensive heart disease without heart failure. Blood pressure stable on current therapy. 5 history of hypothyroidism, on Synthroid, followed by Dr. Kathryne Eriksson. 6.  Diabetes mellitus    Current medicines are reviewed at length with the patient today.  The patient does not have concerns  regarding medicines.  The following changes have been made:    Labs/ tests ordered today include:   Orders Placed This Encounter  Procedures  . EKG 12-Lead    Disposition: Return in 6 months for follow-up office visit with Dr. Johnsie Cancel.  Continue current medication   Signed, Darlin Coco MD 11/23/2015 4:29 PM    [facility] [place], [place], [place]  [address] Phone: [phone]; Fax: [phone]

## 2015-11-23 NOTE — Patient Instructions (Signed)
Medication Instructions:  Your physician recommends that you continue on your current medications as directed. Please refer to the Current Medication list given to you today.  Labwork: none  Testing/Procedures: none  Follow-Up: Your physician wants you to follow-up in: 6 month ov with Dr Blima Singer will receive a reminder letter in the mail two months in advance. If you don't receive a letter, please call our office to schedule the follow-up appointment.  If you need a refill on your cardiac medications before your next appointment, please call your pharmacy.

## 2015-12-01 DIAGNOSIS — E228 Other hyperfunction of pituitary gland: Secondary | ICD-10-CM | POA: Diagnosis not present

## 2015-12-01 DIAGNOSIS — E039 Hypothyroidism, unspecified: Secondary | ICD-10-CM | POA: Diagnosis not present

## 2015-12-05 DIAGNOSIS — E1143 Type 2 diabetes mellitus with diabetic autonomic (poly)neuropathy: Secondary | ICD-10-CM | POA: Diagnosis not present

## 2015-12-05 DIAGNOSIS — E039 Hypothyroidism, unspecified: Secondary | ICD-10-CM | POA: Diagnosis not present

## 2015-12-05 DIAGNOSIS — K5900 Constipation, unspecified: Secondary | ICD-10-CM | POA: Diagnosis not present

## 2015-12-05 DIAGNOSIS — I10 Essential (primary) hypertension: Secondary | ICD-10-CM | POA: Diagnosis not present

## 2015-12-13 DIAGNOSIS — H401123 Primary open-angle glaucoma, left eye, severe stage: Secondary | ICD-10-CM | POA: Diagnosis not present

## 2015-12-19 ENCOUNTER — Other Ambulatory Visit: Payer: Self-pay | Admitting: Endocrinology

## 2015-12-19 DIAGNOSIS — E228 Other hyperfunction of pituitary gland: Secondary | ICD-10-CM

## 2015-12-22 ENCOUNTER — Ambulatory Visit (INDEPENDENT_AMBULATORY_CARE_PROVIDER_SITE_OTHER): Payer: PPO | Admitting: *Deleted

## 2015-12-22 DIAGNOSIS — I255 Ischemic cardiomyopathy: Secondary | ICD-10-CM | POA: Diagnosis not present

## 2015-12-22 NOTE — Progress Notes (Signed)
Remote ICD transmission.   

## 2015-12-30 LAB — CUP PACEART REMOTE DEVICE CHECK
Battery Remaining Longevity: 128 mo
Brady Statistic RV Percent Paced: 0.46 %
HIGH POWER IMPEDANCE MEASURED VALUE: 55 Ohm
Lead Channel Impedance Value: 304 Ohm
Lead Channel Impedance Value: 342 Ohm
Lead Channel Pacing Threshold Amplitude: 0.875 V
Lead Channel Setting Sensing Sensitivity: 0.3 mV
MDC IDC LEAD IMPLANT DT: 20090204
MDC IDC LEAD LOCATION: 753860
MDC IDC LEAD MODEL: 6935
MDC IDC MSMT BATTERY VOLTAGE: 3.02 V
MDC IDC MSMT LEADCHNL RV PACING THRESHOLD PULSEWIDTH: 0.4 ms
MDC IDC MSMT LEADCHNL RV SENSING INTR AMPL: 7.375 mV
MDC IDC MSMT LEADCHNL RV SENSING INTR AMPL: 7.375 mV
MDC IDC SESS DTM: 20170302062503
MDC IDC SET LEADCHNL RV PACING AMPLITUDE: 2.5 V
MDC IDC SET LEADCHNL RV PACING PULSEWIDTH: 0.4 ms

## 2015-12-30 NOTE — Progress Notes (Signed)
Normal remote reviewed.  Next follow up 02/2016 in clinic.  

## 2016-01-04 ENCOUNTER — Encounter: Payer: Self-pay | Admitting: Cardiology

## 2016-02-24 DIAGNOSIS — H6123 Impacted cerumen, bilateral: Secondary | ICD-10-CM | POA: Diagnosis not present

## 2016-03-14 DIAGNOSIS — M71572 Other bursitis, not elsewhere classified, left ankle and foot: Secondary | ICD-10-CM | POA: Diagnosis not present

## 2016-03-14 DIAGNOSIS — M7732 Calcaneal spur, left foot: Secondary | ICD-10-CM | POA: Diagnosis not present

## 2016-03-14 DIAGNOSIS — M722 Plantar fascial fibromatosis: Secondary | ICD-10-CM | POA: Diagnosis not present

## 2016-03-14 DIAGNOSIS — M7662 Achilles tendinitis, left leg: Secondary | ICD-10-CM | POA: Diagnosis not present

## 2016-03-22 ENCOUNTER — Other Ambulatory Visit: Payer: Self-pay | Admitting: *Deleted

## 2016-03-22 MED ORDER — RAMIPRIL 1.25 MG PO CAPS: ORAL_CAPSULE | ORAL | Status: DC

## 2016-03-23 ENCOUNTER — Encounter: Payer: Self-pay | Admitting: Internal Medicine

## 2016-03-23 ENCOUNTER — Ambulatory Visit (INDEPENDENT_AMBULATORY_CARE_PROVIDER_SITE_OTHER): Payer: PPO | Admitting: Internal Medicine

## 2016-03-23 VITALS — BP 120/58 | HR 59 | Ht 66.0 in | Wt 175.8 lb

## 2016-03-23 DIAGNOSIS — I509 Heart failure, unspecified: Secondary | ICD-10-CM

## 2016-03-23 DIAGNOSIS — I255 Ischemic cardiomyopathy: Secondary | ICD-10-CM | POA: Diagnosis not present

## 2016-03-23 LAB — CUP PACEART INCLINIC DEVICE CHECK
Battery Remaining Longevity: 126 mo
Date Time Interrogation Session: 20170602132857
HIGH POWER IMPEDANCE MEASURED VALUE: 52 Ohm
Implantable Lead Implant Date: 20090204
Implantable Lead Location: 753860
Lead Channel Pacing Threshold Amplitude: 0.75 V
Lead Channel Pacing Threshold Pulse Width: 0.4 ms
Lead Channel Setting Sensing Sensitivity: 0.3 mV
MDC IDC LEAD MODEL: 6935
MDC IDC MSMT BATTERY VOLTAGE: 3.02 V
MDC IDC MSMT LEADCHNL RV IMPEDANCE VALUE: 323 Ohm
MDC IDC MSMT LEADCHNL RV IMPEDANCE VALUE: 380 Ohm
MDC IDC MSMT LEADCHNL RV SENSING INTR AMPL: 8.875 mV
MDC IDC SET LEADCHNL RV PACING AMPLITUDE: 2.5 V
MDC IDC SET LEADCHNL RV PACING PULSEWIDTH: 0.4 ms
MDC IDC STAT BRADY RV PERCENT PACED: 0.51 %

## 2016-03-23 NOTE — Progress Notes (Signed)
HPI Gregory [name] returns today for followup. He is a very pleasant 82 year old man with a history of chronic systolic heart failure, an ischemic cardiomyopathy after a large anterior myocardial infarction, status post ICD implantation. In 2009, his ICD lead fracture, and he underwent insertion of a new device. He has been stable in the interim except he fell and injured his leg. He has some residual swelling.  No fracture.  Overall the patient is stable with no worsening heart there symptoms and no anginal symptoms. His heart failure is class II. His wife who is with him today notes he is quite sedentary. No Known Allergies   Current Outpatient Prescriptions  Medication Sig Dispense Refill  . acetaminophen (TYLENOL) 500 MG tablet Take 500 mg by mouth every 6 (six) hours as needed. For pain    . amiodarone (PACERONE) 200 MG tablet Take 0.5 tablets (100 mg total) by mouth every morning. 45 tablet 3  . aspirin EC 325 MG tablet Take 325 mg by mouth every morning.    Marland Kitchen atorvastatin (LIPITOR) 20 MG tablet Take 20 mg by mouth daily.     . calcium carbonate (TUMS EX) 750 MG chewable tablet Chew 1 tablet by mouth daily.     . carvedilol (COREG) 12.5 MG tablet TAKE 1 TABLET (12.5 MG TOTAL) BY MOUTH 2 (TWO) TIMES DAILY. 180 tablet 1  . cetirizine (ZYRTEC) 10 MG tablet Take 10 mg by mouth daily.    . cholecalciferol (VITAMIN D) 1000 UNITS tablet Take 1,000 Units by mouth daily.    . diphenhydrAMINE (BENADRYL) 25 mg capsule Take 25 mg by mouth daily as needed for allergies.     Marland Kitchen dorzolamide-timolol (COSOPT) 22.3-6.8 MG/ML ophthalmic solution Place 1 drop into the left eye daily.    . ferrous sulfate 325 (65 FE) MG tablet Take 325 mg by mouth daily with breakfast.    . furosemide (LASIX) 40 MG tablet Take 1 tablet (40 mg total) by mouth daily. 90 tablet 1  . gabapentin (NEURONTIN) 300 MG capsule Take 600 mg by mouth every evening.     . Inulin (METAMUCIL CLEAR & NATURAL) POWD Take 15 mLs by mouth  daily as needed (fiber).     . latanoprost (XALATAN) 0.005 % ophthalmic solution Place 1 drop into the left eye at bedtime.    . metFORMIN (GLUCOPHAGE) 500 MG tablet Take 500 mg by mouth 2 (two) times daily.     . Multiple Vitamins-Minerals (CENTRUM SILVER PO) Take 1 tablet by mouth daily.     . nitroGLYCERIN (NITROSTAT) 0.4 MG SL tablet Place 1 tablet (0.4 mg total) under the tongue every 5 (five) minutes as needed for chest pain. 25 tablet 5  . promethazine (PHENERGAN) 25 MG tablet Take 25 mg by mouth every 4 (four) hours as needed. nausea  0  . ramipril (ALTACE) 1.25 MG capsule TAKE 1 CAPSULE BY MOUTH EVERY DAY 90 capsule 2  . SYNTHROID 137 MCG tablet Take 137 mcg by mouth daily.  0  . tamsulosin (FLOMAX) 0.4 MG CAPS capsule Take 0.4 mg by mouth daily after breakfast.     . temazepam (RESTORIL) 30 MG capsule Take 30 mg by mouth at bedtime as needed. For sleep     No current facility-administered medications for this visit.     Past Medical History  Diagnosis Date  . Ischemic heart disease   . Hypothyroidism   . Diabetes mellitus   . Dyslipidemia   . Essential hypertension   .  Ischemic cardiomyopathy   . Diabetic neuropathy ([place])   . Myocardial infarction ([place]) 2006    LARGE ANTERIOR WALL  . Diverticulosis   . Colon polyps   . Internal hemorrhoid   . Allergy   . Cataract   . Glaucoma   . CHF (congestive heart failure) ([place])   . Hyperlipidemia   . ICD (implantable cardiac defibrillator) in place 11-2004    ROS:   All systems reviewed and negative except as noted in the HPI.   Past Surgical History  Procedure Laterality Date  . Coronary artery bypass graft  12/02/04  . Cardiac defibrillator placement    . Cholecystectomy, laparoscopic    . Eye surgery      SOCKET SURGERY-BORN WITHOUT RIGHT EYE  . Shoulder surgery      RIGHT  . Cataract extraction      left  . Cholecystectomy    . Tonsillectomy    . Esophagogastroduodenoscopy N/A 12/01/2012    Procedure:  ESOPHAGOGASTRODUODENOSCOPY (EGD);  Surgeon: Irene Shipper, MD;  Location: [facility];  Service: Endoscopy;  Laterality: N/A;  . Colonoscopy N/A 12/01/2012    Procedure: COLONOSCOPY;  Surgeon: Irene Shipper, MD;  Location: [facility];  Service: Endoscopy;  Laterality: N/A;  . Implantable cardioverter defibrillator (icd) generator change N/A 03/11/2014    Procedure: ICD GENERATOR CHANGE;  Surgeon: Evans Lance, MD;  Location: [facility] CATH LAB;  Service: Cardiovascular;  Laterality: N/A;     Family History  Problem Relation Age of Onset  . Colon cancer Neg Hx   . Esophageal cancer Neg Hx   . Rectal cancer Neg Hx   . Stomach cancer Neg Hx   . Diabetes Mother   . Prostate cancer Father   . Cancer Daughter     ? unknown, in stomach  . Diabetes       Social History   Social History  . Marital Status: Married    Spouse Name: N/A  . Number of Children: 1  . Years of Education: N/A   Occupational History  . retired    Social History Main Topics  . Smoking status: Former Smoker    Types: Cigarettes    Quit date: 10/22/2004  . Smokeless tobacco: Never Used     Comment: quit smoking pipe/cigar in 2006  . Alcohol Use: No  . Drug Use: No  . Sexual Activity: Not on file   Other Topics Concern  . Not on file   Social History Narrative     BP 120/58 mmHg  Pulse 59  Ht 5\' 6"  (1.676 m)  Wt 175 lb 12.8 oz (79.742 kg)  BMI 28.39 kg/m2  Physical Exam:  Well appearing 82 year old man, NAD HEENT: Unremarkable, except for a bandage over the right eye, no underlying right eye present Neck:  6 cm JVD, no thyromegally Back:  No CVA tenderness Lungs:  Clear with no wheezes, rales, or rhonchi. Well healed PPM incision. HEART:  Regular rate rhythm, no murmurs, no rubs, no clicks Abd:  soft, positive bowel sounds, no organomegally, no rebound, no guarding Ext:  2 plus pulses, no edema, no cyanosis, no clubbing Skin:  No rashes no nodules Neuro:  CN II through XII intact, motor grossly  intact   DEVICE  Device interrogation demonstrates normal device function.   Assess/Plan: 1. Chronic systolic heart failure - his symptoms are class 2B. He is becoming more sedentary and I have recommended he increase his physical activity. 2. CAD - he denies anginal symptoms.  He has had no anginal symptoms.  3. ICD - his Medtronic single chamber ICD is working normally.  Mikle Bosworth.D.

## 2016-03-23 NOTE — Patient Instructions (Signed)
Medication Instructions:  Your physician recommends that you continue on your current medications as directed. Please refer to the Current Medication list given to you today.   Labwork: None ordered   Testing/Procedures: None ordered   Follow-Up:  Your physician wants you to follow-up in: 12 months with Dr Knox Saliva will receive a reminder letter in the mail two months in advance. If you don't receive a letter, please call our office to schedule the follow-up appointment.  Remote monitoring is used to monitor your  ICD from home. This monitoring reduces the number of office visits required to check your device to one time per year. It allows [place] to keep an eye on the functioning of your device to ensure it is working properly. You are scheduled for a device check from home on 06/26/16. You may send your transmission at any time that day. If you have a wireless device, the transmission will be sent automatically. After your physician reviews your transmission, you will receive a postcard with your next transmission date.     Any Other Special Instructions Will Be Listed Below (If Applicable).     If you need a refill on your cardiac medications before your next appointment, please call your pharmacy.

## 2016-03-28 DIAGNOSIS — M71572 Other bursitis, not elsewhere classified, left ankle and foot: Secondary | ICD-10-CM | POA: Diagnosis not present

## 2016-03-28 DIAGNOSIS — M7662 Achilles tendinitis, left leg: Secondary | ICD-10-CM | POA: Diagnosis not present

## 2016-04-11 ENCOUNTER — Other Ambulatory Visit: Payer: Self-pay | Admitting: *Deleted

## 2016-04-11 DIAGNOSIS — H5340 Unspecified visual field defects: Secondary | ICD-10-CM | POA: Diagnosis not present

## 2016-04-11 DIAGNOSIS — H401123 Primary open-angle glaucoma, left eye, severe stage: Secondary | ICD-10-CM | POA: Diagnosis not present

## 2016-04-11 DIAGNOSIS — E119 Type 2 diabetes mellitus without complications: Secondary | ICD-10-CM | POA: Diagnosis not present

## 2016-04-11 DIAGNOSIS — Z961 Presence of intraocular lens: Secondary | ICD-10-CM | POA: Diagnosis not present

## 2016-04-11 MED ORDER — AMIODARONE HCL 200 MG PO TABS: 100.0000 mg | ORAL_TABLET | Freq: Every morning | ORAL | Status: DC

## 2016-04-13 DIAGNOSIS — M7662 Achilles tendinitis, left leg: Secondary | ICD-10-CM | POA: Diagnosis not present

## 2016-04-13 DIAGNOSIS — M71572 Other bursitis, not elsewhere classified, left ankle and foot: Secondary | ICD-10-CM | POA: Diagnosis not present

## 2016-04-27 DIAGNOSIS — E034 Atrophy of thyroid (acquired): Secondary | ICD-10-CM | POA: Diagnosis not present

## 2016-04-27 DIAGNOSIS — E785 Hyperlipidemia, unspecified: Secondary | ICD-10-CM | POA: Diagnosis not present

## 2016-04-27 DIAGNOSIS — N182 Chronic kidney disease, stage 2 (mild): Secondary | ICD-10-CM | POA: Diagnosis not present

## 2016-04-27 DIAGNOSIS — I2510 Atherosclerotic heart disease of native coronary artery without angina pectoris: Secondary | ICD-10-CM | POA: Diagnosis not present

## 2016-04-27 DIAGNOSIS — E1122 Type 2 diabetes mellitus with diabetic chronic kidney disease: Secondary | ICD-10-CM | POA: Diagnosis not present

## 2016-04-27 DIAGNOSIS — I129 Hypertensive chronic kidney disease with stage 1 through stage 4 chronic kidney disease, or unspecified chronic kidney disease: Secondary | ICD-10-CM | POA: Diagnosis not present

## 2016-05-04 ENCOUNTER — Other Ambulatory Visit: Payer: Self-pay | Admitting: *Deleted

## 2016-05-04 MED ORDER — NITROGLYCERIN 0.4 MG SL SUBL: 0.4000 mg | SUBLINGUAL_TABLET | SUBLINGUAL | Status: AC | PRN

## 2016-05-09 ENCOUNTER — Telehealth: Payer: Self-pay | Admitting: *Deleted

## 2016-05-16 DIAGNOSIS — E1143 Type 2 diabetes mellitus with diabetic autonomic (poly)neuropathy: Secondary | ICD-10-CM | POA: Diagnosis not present

## 2016-06-04 NOTE — Progress Notes (Deleted)
Cardiology Office Note   Date:  06/04/2016   ID:  Gregory [name], DOB 02-22-1933, MRN [id number]  PCP:  Woody Seller, MD  Cardiologist: Darlin Coco MD  No chief complaint on file.     History of Present Illness: Gregory [name] is a 82 y.o. male who presents for f/u ischemic DCM New to me Previously seen by Dr Mare Ferrari and AICD followed by Dr Lovena Le  This pleasant 82 y.o.  gentleman with  a past history of ischemic cardiomyopathy. He has chronic systolic congestive heart failure. He has a history of previous coronary bypass graft surgery. He has an ICD in place. He had an ICD generator change on 03/11/14. He has a Medtronic single chamber device. He has a history of diabetes mellitus. His ejection fraction by stress test in June 2010 was 37%. His last echocardiogram on 02/16/14 showed an ejection fraction of 25-30%. There was mild mitral regurgitation. There was evidence of a high diastolic filling pressures with diastolic mitral regurgitation and with an E./E. prime ratio of more than 25. The patient is on long-term low-dose amiodarone because of previous ventricular tachycardia. The patient has not been experiencing any exertional chest pain or angina pectoris.  The patient has not been having any exacerbation of CHF. He sleeps on one thick pillow. He does not have paroxysmal nocturnal dyspnea.   The patient has a history of diabetes.  He tells me that he has an appointment soon to see endocrinologist Dr. Soyla Murphy.  Past Medical History:  Diagnosis Date  . Allergy   . Cataract   . CHF (congestive heart failure) ([place])   . Colon polyps   . Diabetes mellitus   . Diabetic neuropathy ([place])   . Diverticulosis   . Dyslipidemia   . Essential hypertension   . Glaucoma   . Hyperlipidemia   . Hypothyroidism   . ICD (implantable cardiac defibrillator) in place 11-2004  . Internal hemorrhoid   . Ischemic cardiomyopathy   . Ischemic heart disease   . Myocardial  infarction [facility]) 2006   LARGE ANTERIOR WALL    Past Surgical History:  Procedure Laterality Date  . CARDIAC DEFIBRILLATOR PLACEMENT    . CATARACT EXTRACTION     left  . CHOLECYSTECTOMY    . CHOLECYSTECTOMY, LAPAROSCOPIC    . COLONOSCOPY N/A 12/01/2012   Procedure: COLONOSCOPY;  Surgeon: Irene Shipper, MD;  Location: [facility];  Service: Endoscopy;  Laterality: N/A;  . CORONARY ARTERY BYPASS GRAFT  12/02/04  . ESOPHAGOGASTRODUODENOSCOPY N/A 12/01/2012   Procedure: ESOPHAGOGASTRODUODENOSCOPY (EGD);  Surgeon: Irene Shipper, MD;  Location: [facility];  Service: Endoscopy;  Laterality: N/A;  . 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 IMPLANTABLE CARDIOVERTER DEFIBRILLATOR (ICD) GENERATOR CHANGE N/A 03/11/2014   Procedure: ICD GENERATOR CHANGE;  Surgeon: Evans Lance, MD;  Location: [facility] CATH LAB;  Service: Cardiovascular;  Laterality: N/A;  . SHOULDER SURGERY     RIGHT  . TONSILLECTOMY       Current Outpatient Prescriptions  Medication Sig Dispense Refill  . acetaminophen (TYLENOL) 500 MG tablet Take 500 mg by mouth every 6 (six) hours as needed. For pain    . amiodarone (PACERONE) 200 MG tablet Take 0.5 tablets (100 mg total) by mouth every morning. 45 tablet 2  . aspirin EC 325 MG tablet Take 325 mg by mouth every morning.    Marland Kitchen atorvastatin (LIPITOR) 20 MG tablet Take 20 mg by mouth daily.     Marland Kitchen  calcium carbonate (TUMS EX) 750 MG chewable tablet Chew 1 tablet by mouth daily.     . carvedilol (COREG) 12.5 MG tablet TAKE 1 TABLET (12.5 MG TOTAL) BY MOUTH 2 (TWO) TIMES DAILY. 180 tablet 1  . cetirizine (ZYRTEC) 10 MG tablet Take 10 mg by mouth daily.    . cholecalciferol (VITAMIN D) 1000 UNITS tablet Take 1,000 Units by mouth daily.    . diphenhydrAMINE (BENADRYL) 25 mg capsule Take 25 mg by mouth daily as needed for allergies.     Marland Kitchen dorzolamide-timolol (COSOPT) 22.3-6.8 MG/ML ophthalmic solution Place 1 drop into the left eye daily.    . ferrous sulfate 325 (65 FE) MG  tablet Take 325 mg by mouth daily with breakfast.    . furosemide (LASIX) 40 MG tablet Take 1 tablet (40 mg total) by mouth daily. 90 tablet 1  . gabapentin (NEURONTIN) 300 MG capsule Take 600 mg by mouth every evening.     . Inulin (METAMUCIL CLEAR & NATURAL) POWD Take 15 mLs by mouth daily as needed (fiber).     . latanoprost (XALATAN) 0.005 % ophthalmic solution Place 1 drop into the left eye at bedtime.    . metFORMIN (GLUCOPHAGE) 500 MG tablet Take 500 mg by mouth 2 (two) times daily.     . Multiple Vitamins-Minerals (CENTRUM SILVER PO) Take 1 tablet by mouth daily.     . nitroGLYCERIN (NITROSTAT) 0.4 MG SL tablet Place 1 tablet (0.4 mg total) under the tongue every 5 (five) minutes as needed for chest pain. 25 tablet 2  . promethazine (PHENERGAN) 25 MG tablet Take 25 mg by mouth every 4 (four) hours as needed. nausea  0  . ramipril (ALTACE) 1.25 MG capsule TAKE 1 CAPSULE BY MOUTH EVERY DAY 90 capsule 2  . SYNTHROID 137 MCG tablet Take 137 mcg by mouth daily.  0  . tamsulosin (FLOMAX) 0.4 MG CAPS capsule Take 0.4 mg by mouth daily after breakfast.     . temazepam (RESTORIL) 30 MG capsule Take 30 mg by mouth at bedtime as needed. For sleep     No current facility-administered medications for this visit.     Allergies:   Review of patient's allergies indicates no known allergies.    Social History:  The patient  reports that he quit smoking about 11 years ago. His smoking use included Cigarettes. He has never used smokeless tobacco. He reports that he does not drink alcohol or use drugs.   Family History:  The patient's family history includes Cancer in his daughter; Diabetes in his mother; Prostate cancer in his father.    ROS:  Please see the history of present illness.   Otherwise, review of systems are positive for none.   All other systems are reviewed and negative.    PHYSICAL EXAM: VS:  There were no vitals taken for this visit. , BMI There is no height or weight on file to  calculate BMI. GEN: Well nourished, well developed, in no acute distress  HEENT: normal  Neck: no JVD, carotid bruits, or masses Cardiac: RRR; no murmurs, rubs, or gallops,no edema  Respiratory:  clear to auscultation bilaterally, normal work of breathing GI: soft, nontender, nondistended, + BS MS: no deformity or atrophy  Skin: warm and dry, no rash Neuro:  Strength and sensation are intact Psych: euthymic mood, full affect   EKG:   He appears to be in normal sinus rhythm with first-degree block.  Wide QRS.  Old inferior and old lateral myocardial  infarction.   Recent Labs: No results found for requested labs within last 8760 hours.    Lipid Panel    Component Value Date/Time   CHOL 140 12/31/2011 1220   TRIG 343.0 (H) 12/31/2011 1220   HDL 41.40 12/31/2011 1220   CHOLHDL 3 12/31/2011 1220   VLDL 68.6 (H) 12/31/2011 1220   LDLCALC 47 02/27/2010   LDLDIRECT 57.2 12/31/2011 1220      Wt Readings from Last 3 Encounters:  03/23/16 175 lb 12.8 oz (79.7 kg)  11/23/15 176 lb 6.4 oz (80 kg)  03/31/15 178 lb (80.7 kg)         ASSESSMENT AND PLAN:  1. ischemic heart disease with ischemic cardiomyopathy. No recent chest pain or angina. 2. functioning Medtronic single-chamber ICD. No recent discharges. Followed [place]  3. Hypercholesterolemia 4. benign hypertensive heart disease without heart failure. Blood pressure stable on current therapy. 5 history of hypothyroidism, on Synthroid, followed by Dr. Kathryne Eriksson. 6.  Diabetes mellitus Discussed low carb diet.  Target hemoglobin A1c is 6.5 or less.  Continue current medications.     Current medicines are reviewed at length with the patient today.  The patient does not have concerns regarding medicines.  The following changes have been made:    Labs/ tests ordered today include:   No orders of the defined types were placed in this encounter.   Jenkins Rouge

## 2016-06-05 ENCOUNTER — Ambulatory Visit: Payer: PPO | Admitting: Cardiovascular Disease

## 2016-06-26 ENCOUNTER — Ambulatory Visit (INDEPENDENT_AMBULATORY_CARE_PROVIDER_SITE_OTHER): Payer: PPO | Admitting: *Deleted

## 2016-06-26 DIAGNOSIS — I255 Ischemic cardiomyopathy: Secondary | ICD-10-CM

## 2016-06-26 NOTE — Progress Notes (Signed)
Remote ICD transmission.   

## 2016-06-27 LAB — CUP PACEART REMOTE DEVICE CHECK
Battery Remaining Longevity: 124 mo
Brady Statistic RV Percent Paced: 0.55 %
Date Time Interrogation Session: 20170905103524
HIGH POWER IMPEDANCE MEASURED VALUE: 59 Ohm
Implantable Lead Model: 6935
Lead Channel Impedance Value: 323 Ohm
Lead Channel Setting Pacing Pulse Width: 0.4 ms
Lead Channel Setting Sensing Sensitivity: 0.3 mV
MDC IDC LEAD IMPLANT DT: 20090204
MDC IDC LEAD LOCATION: 753860
MDC IDC MSMT BATTERY VOLTAGE: 3.01 V
MDC IDC MSMT LEADCHNL RV IMPEDANCE VALUE: 342 Ohm
MDC IDC MSMT LEADCHNL RV PACING THRESHOLD AMPLITUDE: 1 V
MDC IDC MSMT LEADCHNL RV PACING THRESHOLD PULSEWIDTH: 0.4 ms
MDC IDC MSMT LEADCHNL RV SENSING INTR AMPL: 8.25 mV
MDC IDC MSMT LEADCHNL RV SENSING INTR AMPL: 8.25 mV
MDC IDC SET LEADCHNL RV PACING AMPLITUDE: 2.5 V

## 2016-06-29 ENCOUNTER — Encounter: Payer: Self-pay | Admitting: Cardiology

## 2016-07-17 NOTE — Progress Notes (Signed)
Cardiology Office Note   Date:  07/23/2016   ID:  TOBBY FAWCETT, DOB 1933-10-11, MRN [id number]  PCP:  Woody Seller, MD  Cardiologist: Darlin Coco MD  Chief Complaint  Patient presents with  . Chronic congestive heart failure, unspecified congestive hea  . Cardiomyopathy      History of Present Illness: Gregory [name] is a 82 y.o. male who presents for a six-month follow-up visit.  This pleasant 82 y.o.  gentleman new to me Previously seen by Dr Mare Ferrari and followed for his AICD by Dr Lovena Le. He has a past history of ischemic cardiomyopathy. He has chronic systolic congestive heart failure. He has a history of previous coronary bypass graft surgery. He has an ICD in place. He had an ICD generator change on 03/11/14. He has a Medtronic single chamber device. He has a history of diabetes mellitus. His ejection fraction by stress test in June 2010 was 37%. His last echocardiogram on 02/16/14 showed an ejection fraction of 25-30%. There was mild mitral regurgitation. There was evidence of a high diastolic filling pressures with diastolic mitral regurgitation and with an E./E. prime ratio of more than 25. The patient is on long-term low-dose amiodarone because of previous ventricular tachycardia. The patient has not been experiencing any exertional chest pain or angina pectoris.  The patient has not been having any exacerbation of CHF. He sleeps on one thick pillow. He does not have paroxysmal nocturnal dyspnea. The patient has a history of diabetes followed by Dr Michiel Sites   Past Medical History:  Diagnosis Date  . Allergy   . Cataract   . CHF (congestive heart failure) ([place])   . Colon polyps   . Diabetes mellitus   . Diabetic neuropathy ([place])   . Diverticulosis   . Dyslipidemia   . Essential hypertension   . Glaucoma   . Hyperlipidemia   . Hypothyroidism   . ICD (implantable cardiac defibrillator) in place 11-2004  . Internal hemorrhoid   . Ischemic  cardiomyopathy   . Ischemic heart disease   . Myocardial infarction 2006   LARGE ANTERIOR WALL    Past Surgical History:  Procedure Laterality Date  . CARDIAC DEFIBRILLATOR PLACEMENT    . CATARACT EXTRACTION     left  . CHOLECYSTECTOMY    . CHOLECYSTECTOMY, LAPAROSCOPIC    . COLONOSCOPY N/A 12/01/2012   Procedure: COLONOSCOPY;  Surgeon: Irene Shipper, MD;  Location: [facility];  Service: Endoscopy;  Laterality: N/A;  . CORONARY ARTERY BYPASS GRAFT  12/02/04  . ESOPHAGOGASTRODUODENOSCOPY N/A 12/01/2012   Procedure: ESOPHAGOGASTRODUODENOSCOPY (EGD);  Surgeon: Irene Shipper, MD;  Location: [facility];  Service: Endoscopy;  Laterality: N/A;  . 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 IMPLANTABLE CARDIOVERTER DEFIBRILLATOR (ICD) GENERATOR CHANGE N/A 03/11/2014   Procedure: ICD GENERATOR CHANGE;  Surgeon: Evans Lance, MD;  [facility] CATH LAB;  Service: Cardiovascular;  Laterality: N/A;  . SHOULDER SURGERY     RIGHT  . TONSILLECTOMY       Current Outpatient Prescriptions  Medication Sig Dispense Refill  . acetaminophen (TYLENOL) 500 MG tablet Take 500 mg by mouth every 6 (six) hours as needed. For pain    . amiodarone (PACERONE) 200 MG tablet Take 0.5 tablets (100 mg total) by mouth every morning. 45 tablet 2  . aspirin EC 325 MG tablet Take 325 mg by mouth every morning.    Marland Kitchen atorvastatin (LIPITOR) 20 MG tablet Take 20 mg by mouth  daily.     . calcium carbonate (TUMS EX) 750 MG chewable tablet Chew 1 tablet by mouth daily.     . carvedilol (COREG) 12.5 MG tablet TAKE 1 TABLET (12.5 MG TOTAL) BY MOUTH 2 (TWO) TIMES DAILY. 180 tablet 1  . cetirizine (ZYRTEC) 10 MG tablet Take 10 mg by mouth daily.    . cholecalciferol (VITAMIN D) 1000 UNITS tablet Take 1,000 Units by mouth daily.    . diphenhydrAMINE (BENADRYL) 25 mg capsule Take 25 mg by mouth daily as needed for allergies.     Marland Kitchen dorzolamide-timolol (COSOPT) 22.3-6.8 MG/ML ophthalmic solution Place 1 drop into the  left eye daily.    . ferrous sulfate 325 (65 FE) MG tablet Take 325 mg by mouth daily with breakfast.    . furosemide (LASIX) 40 MG tablet Take 1 tablet (40 mg total) by mouth daily. 90 tablet 1  . gabapentin (NEURONTIN) 300 MG capsule Take 600 mg by mouth every evening.     . Inulin (METAMUCIL CLEAR & NATURAL) POWD Take 15 mLs by mouth daily as needed (fiber).     . latanoprost (XALATAN) 0.005 % ophthalmic solution Place 1 drop into the left eye at bedtime.    . metFORMIN (GLUCOPHAGE) 500 MG tablet Take 500 mg by mouth 2 (two) times daily.     . Multiple Vitamins-Minerals (CENTRUM SILVER PO) Take 1 tablet by mouth daily.     . nitroGLYCERIN (NITROSTAT) 0.4 MG SL tablet Place 1 tablet (0.4 mg total) under the tongue every 5 (five) minutes as needed for chest pain. 25 tablet 2  . promethazine (PHENERGAN) 25 MG tablet Take 25 mg by mouth every 4 (four) hours as needed. nausea  0  . ramipril (ALTACE) 1.25 MG capsule TAKE 1 CAPSULE BY MOUTH EVERY DAY 90 capsule 2  . SYNTHROID 137 MCG tablet Take 137 mcg by mouth daily.  0  . tamsulosin (FLOMAX) 0.4 MG CAPS capsule Take 0.4 mg by mouth daily after breakfast.     . temazepam (RESTORIL) 30 MG capsule Take 30 mg by mouth at bedtime as needed. For sleep     No current facility-administered medications for this visit.     Allergies:   Review of patient's allergies indicates no known allergies.    Social History:  The patient  reports that he quit smoking about 11 years ago. His smoking use included Cigarettes. He has never used smokeless tobacco. He reports that he does not drink alcohol or use drugs.   Family History:  The patient's family history includes Cancer in his daughter; Diabetes in his mother; Prostate cancer in his father.    ROS:  Please see the history of present illness.   Otherwise, review of systems are positive for none.   All other systems are reviewed and negative.    PHYSICAL EXAM: VS:  BP (!) 122/54   Pulse (!) 59   Ht 5'  6" (1.676 m)   Wt 83 kg (183 lb)   SpO2 97%   BMI 29.54 kg/m  , BMI Body mass index is 29.54 kg/m. 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 The ekg ordered today demonstrates P waves are difficult to see.  He appears to be in normal sinus rhythm with first-degree block.  Wide QRS.  Old inferior and old lateral myocardial infarction.   Recent Labs: No results found for requested labs within last 8760 hours.    Lipid Panel    Component Value Date/Time   CHOL 140 12/31/2011 1220   TRIG 343.0 (H) 12/31/2011 1220   HDL 41.40 12/31/2011 1220   CHOLHDL 3 12/31/2011 1220   VLDL 68.6 (H) 12/31/2011 1220   LDLCALC 47 02/27/2010   LDLDIRECT 57.2 12/31/2011 1220      Wt Readings from Last 3 Encounters:  07/23/16 83 kg (183 lb)  03/23/16 79.7 kg (175 lb 12.8 oz)  11/23/15 80 kg (176 lb 6.4 oz)         ASSESSMENT AND PLAN:  1. ischemic heart disease with ischemic cardiomyopathy. No recent chest pain or angina. 2. functioning Medtronic single-chamber ICD. No recent discharges. History of VT on amiodarone Will get thyroid/liver and PFt;s with DLCO 3. Hypercholesterolemia  Labs with primary  Lab Results  Component Value Date   LDLCALC 47 02/27/2010    4. benign hypertensive heart disease without heart failure. Blood pressure stable on current therapy. 5 history of hypothyroidism, on Synthroid, followed by Dr. Kathryne Eriksson. 6.  Diabetes mellitus followed by Dr Michiel Sites discussed low carb diet     Current medicines are reviewed at length  with the patient today.  The patient does not have concerns regarding medicines.  The following changes have been made:    Labs/ tests ordered today include:  Liver Thyroid and PFTls   No orders of the defined types were placed in this encounter.   Disposition: Return in 6 months   Continue current medication   Jenkins Rouge

## 2016-07-18 DIAGNOSIS — R42 Dizziness and giddiness: Secondary | ICD-10-CM | POA: Diagnosis not present

## 2016-07-18 DIAGNOSIS — R270 Ataxia, unspecified: Secondary | ICD-10-CM | POA: Diagnosis not present

## 2016-07-18 DIAGNOSIS — R251 Tremor, unspecified: Secondary | ICD-10-CM | POA: Diagnosis not present

## 2016-07-23 ENCOUNTER — Encounter: Payer: Self-pay | Admitting: Cardiovascular Disease

## 2016-07-23 ENCOUNTER — Ambulatory Visit (INDEPENDENT_AMBULATORY_CARE_PROVIDER_SITE_OTHER): Payer: PPO | Admitting: Cardiovascular Disease

## 2016-07-23 VITALS — BP 122/54 | HR 59 | Ht 66.0 in | Wt 183.0 lb

## 2016-07-23 DIAGNOSIS — Z23 Encounter for immunization: Secondary | ICD-10-CM | POA: Diagnosis not present

## 2016-07-23 DIAGNOSIS — I509 Heart failure, unspecified: Secondary | ICD-10-CM | POA: Diagnosis not present

## 2016-07-23 DIAGNOSIS — I255 Ischemic cardiomyopathy: Secondary | ICD-10-CM

## 2016-07-23 LAB — HEPATIC FUNCTION PANEL
ALK PHOS: 76 U/L (ref 40–115)
ALT: 14 U/L (ref 9–46)
AST: 32 U/L (ref 10–35)
Albumin: 3.9 g/dL (ref 3.6–5.1)
BILIRUBIN DIRECT: 0.1 mg/dL (ref <=0.2)
Indirect Bilirubin: 0.4 mg/dL (ref 0.2–1.2)
Total Bilirubin: 0.5 mg/dL (ref 0.2–1.2)
Total Protein: 7 g/dL (ref 6.1–8.1)

## 2016-07-23 LAB — LIPID PANEL
CHOL/HDL RATIO: 5.7 ratio — AB (ref <=5.0)
Cholesterol: 170 mg/dL (ref 125–200)
HDL: 30 mg/dL — ABNORMAL LOW (ref >=40)
LDL CALC: 88 mg/dL (ref <130)
Triglycerides: 260 mg/dL — ABNORMAL HIGH (ref <150)
VLDL: 52 mg/dL — ABNORMAL HIGH (ref <30)

## 2016-07-23 LAB — TSH: TSH: 8.9 m[IU]/L — AB (ref 0.40–4.50)

## 2016-07-23 MED ORDER — CARVEDILOL 12.5 MG PO TABS: 12.5000 mg | ORAL_TABLET | Freq: Two times a day (BID) | ORAL | 3 refills | Status: DC

## 2016-07-23 MED ORDER — ATORVASTATIN CALCIUM 20 MG PO TABS: 20.0000 mg | ORAL_TABLET | Freq: Every day | ORAL | 3 refills | Status: AC

## 2016-07-23 NOTE — Patient Instructions (Addendum)
Medication Instructions:  Your physician recommends that you continue on your current medications as directed. Please refer to the Current Medication list given to you today.  Labwork: Your physician recommends that you have lab work done today- Lipid and liver panel, TSH  Testing/Procedures: Your physician has recommended that you have a pulmonary function test. Pulmonary Function Tests are a group of tests that measure how well air moves in and out of your lungs.  Follow-Up: Your physician wants you to follow-up in: 6 months with Dr. Johnsie Cancel. You will receive a reminder letter in the mail two months in advance. If you don't receive a letter, please call our office to schedule the follow-up appointment.   If you need a refill on your cardiac medications before your next appointment, please call your pharmacy.

## 2016-08-08 DIAGNOSIS — H401123 Primary open-angle glaucoma, left eye, severe stage: Secondary | ICD-10-CM | POA: Diagnosis not present

## 2016-08-10 ENCOUNTER — Ambulatory Visit: Payer: Self-pay | Admitting: Neurology

## 2016-08-10 ENCOUNTER — Telehealth: Payer: Self-pay

## 2016-08-10 NOTE — Telephone Encounter (Signed)
Message left w/ answer service cancelling today's new patient appt d/t pt fall.

## 2016-08-16 ENCOUNTER — Encounter: Payer: Self-pay | Admitting: Neurology

## 2016-08-31 ENCOUNTER — Ambulatory Visit (INDEPENDENT_AMBULATORY_CARE_PROVIDER_SITE_OTHER): Payer: PPO | Admitting: Internal Medicine

## 2016-08-31 DIAGNOSIS — I509 Heart failure, unspecified: Secondary | ICD-10-CM

## 2016-08-31 DIAGNOSIS — I255 Ischemic cardiomyopathy: Secondary | ICD-10-CM

## 2016-08-31 LAB — PULMONARY FUNCTION TEST
DL/VA % pred: 103 %
DL/VA: 4.48 ml/min/mmHg/L
DLCO cor % pred: 67 %
DLCO cor: 18.16 ml/min/mmHg
DLCO unc % pred: 55 %
DLCO unc: 14.95 ml/min/mmHg
FEF 25-75 Post: 0.76 L/sec
FEF 25-75 Pre: 0.59 L/sec
FEF2575-%Change-Post: 27 %
FEF2575-%PRED-PRE: 38 %
FEF2575-%Pred-Post: 48 %
FEV1-%Change-Post: 3 %
FEV1-%PRED-PRE: 47 %
FEV1-%Pred-Post: 49 %
FEV1-POST: 1.16 L
FEV1-PRE: 1.12 L
FEV1FVC-%Change-Post: 8 %
FEV1FVC-%Pred-Pre: 99 %
FEV6-%CHANGE-POST: -2 %
FEV6-%PRED-PRE: 49 %
FEV6-%Pred-Post: 48 %
FEV6-POST: 1.49 L
FEV6-Pre: 1.53 L
FEV6FVC-%Change-Post: 3 %
FEV6FVC-%PRED-POST: 108 %
FEV6FVC-%Pred-Pre: 105 %
FVC-%CHANGE-POST: -4 %
FVC-%PRED-POST: 45 %
FVC-%PRED-PRE: 47 %
FVC-POST: 1.51 L
FVC-Pre: 1.58 L
POST FEV6/FVC RATIO: 100 %
PRE FEV1/FVC RATIO: 71 %
Post FEV1/FVC ratio: 77 %
Pre FEV6/FVC Ratio: 97 %
RV % PRED: 59 %
RV: 1.46 L
TLC % PRED: 46 %
TLC: 2.94 L

## 2016-08-31 NOTE — Progress Notes (Signed)
PFT done today. 08/31/16  

## 2016-09-01 DIAGNOSIS — H6122 Impacted cerumen, left ear: Secondary | ICD-10-CM | POA: Diagnosis not present

## 2016-09-01 DIAGNOSIS — H60502 Unspecified acute noninfective otitis externa, left ear: Secondary | ICD-10-CM | POA: Diagnosis not present

## 2016-09-19 ENCOUNTER — Encounter: Payer: Self-pay | Admitting: Neurology

## 2016-09-19 ENCOUNTER — Ambulatory Visit (INDEPENDENT_AMBULATORY_CARE_PROVIDER_SITE_OTHER): Payer: PPO | Admitting: Neurology

## 2016-09-19 VITALS — BP 108/48 | HR 56 | Ht 66.0 in | Wt 170.5 lb

## 2016-09-19 DIAGNOSIS — R42 Dizziness and giddiness: Secondary | ICD-10-CM | POA: Diagnosis not present

## 2016-09-19 DIAGNOSIS — E1142 Type 2 diabetes mellitus with diabetic polyneuropathy: Secondary | ICD-10-CM

## 2016-09-19 DIAGNOSIS — E538 Deficiency of other specified B group vitamins: Secondary | ICD-10-CM | POA: Diagnosis not present

## 2016-09-19 DIAGNOSIS — R269 Unspecified abnormalities of gait and mobility: Secondary | ICD-10-CM | POA: Diagnosis not present

## 2016-09-19 HISTORY — DX: Unspecified abnormalities of gait and mobility: R26.9

## 2016-09-19 NOTE — Patient Instructions (Addendum)
We will check a CT of the head and a carotid doppler to look at the circulation of the head.  Blood work will be done today.  Fall Prevention in the Home Falls can cause injuries and can affect people from all age groups. There are many simple things that you can do to make your home safe and to help prevent falls. What can I do on the outside of my home?  Regularly repair the edges of walkways and driveways and fix any cracks.  Remove high doorway thresholds.  Trim any shrubbery on the main path into your home.  Use bright outdoor lighting.  Clear walkways of debris and clutter, including tools and rocks.  Regularly check that handrails are securely fastened and in good repair. Both sides of any steps should have handrails.  Install guardrails along the edges of any raised decks or porches.  Have leaves, snow, and ice cleared regularly.  Use sand or salt on walkways during winter months.  In the garage, clean up any spills right away, including grease or oil spills. What can I do in the bathroom?  Use night lights.  Install grab bars by the toilet and in the tub and shower. Do not use towel bars as grab bars.  Use non-skid mats or decals on the floor of the tub or shower.  If you need to sit down while you are in the shower, use a plastic, non-slip stool.  Keep the floor dry. Immediately clean up any water that spills on the floor.  Remove soap buildup in the tub or shower on a regular basis.  Attach bath mats securely with double-sided non-slip rug tape.  Remove throw rugs and other tripping hazards from the floor. What can I do in the bedroom?  Use night lights.  Make sure that a bedside light is easy to reach.  Do not use oversized bedding that drapes onto the floor.  Have a firm chair that has side arms to use for getting dressed.  Remove throw rugs and other tripping hazards from the floor. What can I do in the kitchen?  Clean up any spills right  away.  Avoid walking on wet floors.  Place frequently used items in easy-to-reach places.  If you need to reach for something above you, use a sturdy step stool that has a grab bar.  Keep electrical cables out of the way.  Do not use floor polish or wax that makes floors slippery. If you have to use wax, make sure that it is non-skid floor wax.  Remove throw rugs and other tripping hazards from the floor. What can I do in the stairways?  Do not leave any items on the stairs.  Make sure that there are handrails on both sides of the stairs. Fix handrails that are broken or loose. Make sure that handrails are as long as the stairways.  Check any carpeting to make sure that it is firmly attached to the stairs. Fix any carpet that is loose or worn.  Avoid having throw rugs at the top or bottom of stairways, or secure the rugs with carpet tape to prevent them from moving.  Make sure that you have a light switch at the top of the stairs and the bottom of the stairs. If you do not have them, have them installed. What are some other fall prevention tips?  Wear closed-toe shoes that fit well and support your feet. Wear shoes that have rubber soles or low heels.  When you use a stepladder, make sure that it is completely opened and that the sides are firmly locked. Have someone hold the ladder while you are using it. Do not climb a closed stepladder.  Add color or contrast paint or tape to grab bars and handrails in your home. Place contrasting color strips on the first and last steps.  Use mobility aids as needed, such as canes, walkers, scooters, and crutches.  Turn on lights if it is dark. Replace any light bulbs that burn out.  Set up furniture so that there are clear paths. Keep the furniture in the same spot.  Fix any uneven floor surfaces.  Choose a carpet design that does not hide the edge of steps of a stairway.  Be aware of any and all pets.  Review your medicines with your  healthcare provider. Some medicines can cause dizziness or changes in blood pressure, which increase your risk of falling. Talk with your health care provider about other ways that you can decrease your risk of falls. This may include working with a physical therapist or trainer to improve your strength, balance, and endurance. This information is not intended to replace advice given to you by your health care provider. Make sure you discuss any questions you have with your health care provider. Document Released: 09/28/2002 Document Revised: 03/06/2016 Document Reviewed: 11/12/2014 Elsevier Interactive Patient Education  2017 [employer].

## 2016-09-19 NOTE — Progress Notes (Addendum)
Reason for visit: Dizziness  Referring physician: Dr. Martina Sinner is a 82 y.o. male  History of present illness:  [name] is an 82 year old left-handed white male with a history of diabetes with diabetic peripheral neuropathy. The patient has chronic problems with gait instability in part related to this. The patient has been on gabapentin for paresthesias in the feet taking 300 mg twice daily. About 2 months ago, the patient began noting a lightheaded and floaty feeling that would occur with standing, or with stooping and then come back up again. The patient will note that if he sits for a long period of time he is more likely to be dizzy when he stands up again. The patient has not had any blackouts or falls recently, but he did fall about 3 or 4 weeks ago. He does not use a cane for ambulation. The patient denies headaches or loss of vision. The patient only has one eye. The patient was increased on his gabapentin taking 300 mg 3 times a day about 2 months ago, but the patient believes that this was done after the dizziness started, not before. The patient denies severe neck pain or low back pain. He reports no focal numbness or weakness of the face, arms, or legs. He is sent to this office for an evaluation.  Past Medical History:  Diagnosis Date  . Allergy   . Cataract   . CHF (congestive heart failure) ([place])   . Colon polyps   . Diabetes mellitus   . Diabetic neuropathy ([place])   . Diverticulosis   . Dyslipidemia   . Essential hypertension   . Glaucoma   . Hyperlipidemia   . Hypothyroidism   . ICD (implantable cardiac defibrillator) in place 11-2004  . Internal hemorrhoid   . Ischemic cardiomyopathy   . Ischemic heart disease   . Myocardial infarction 2006   LARGE ANTERIOR WALL    Past Surgical History:  Procedure Laterality Date  . CARDIAC DEFIBRILLATOR PLACEMENT    . CATARACT EXTRACTION     left  . CHOLECYSTECTOMY    . CHOLECYSTECTOMY, LAPAROSCOPIC     . COLONOSCOPY N/A 12/01/2012   Procedure: COLONOSCOPY;  Surgeon: Irene Shipper, MD;  Location: [facility] ENDOSCOPY;  Service: Endoscopy;  Laterality: N/A;  . CORONARY ARTERY BYPASS GRAFT  12/02/04  . ESOPHAGOGASTRODUODENOSCOPY N/A 12/01/2012   Procedure: ESOPHAGOGASTRODUODENOSCOPY (EGD);  Surgeon: Irene Shipper, MD;  Location: [facility];  Service: Endoscopy;  Laterality: N/A;  . 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 IMPLANTABLE CARDIOVERTER DEFIBRILLATOR (ICD) GENERATOR CHANGE N/A 03/11/2014   Procedure: ICD GENERATOR CHANGE;  Surgeon: Evans Lance, MD;  Location: [facility] CATH LAB;  Service: Cardiovascular;  Laterality: N/A;  . SHOULDER SURGERY     RIGHT  . TONSILLECTOMY      Family History  Problem Relation Age of Onset  . Diabetes Mother   . Prostate cancer Father   . Cancer Daughter     ? unknown, in stomach  . Diabetes    . Colon cancer Neg Hx   . Esophageal cancer Neg Hx   . Rectal cancer Neg Hx   . Stomach cancer Neg Hx     Social history:  reports that he quit smoking about 11 years ago. His smoking use included Cigarettes. He has never used smokeless tobacco. He reports that he does not drink alcohol or use drugs.  Medications:  Prior to Admission medications  Medication Sig Start Date End Date Taking? Authorizing Provider  acetaminophen (TYLENOL) 500 MG tablet Take 500 mg by mouth every 6 (six) hours as needed. For pain    Historical Provider, MD  amiodarone (PACERONE) 200 MG tablet Take 0.5 tablets (100 mg total) by mouth every morning. 04/11/16   Josue Hector, MD  aspirin EC 325 MG tablet Take 325 mg by mouth every morning.    Historical Provider, MD  atorvastatin (LIPITOR) 20 MG tablet Take 1 tablet (20 mg total) by mouth daily. 07/23/16   Josue Hector, MD  calcium carbonate (TUMS EX) 750 MG chewable tablet Chew 1 tablet by mouth daily.     Historical Provider, MD  carvedilol (COREG) 12.5 MG tablet Take 1 tablet (12.5 mg total) by mouth 2 (two) times  daily with a meal. 07/23/16   Josue Hector, MD  cetirizine (ZYRTEC) 10 MG tablet Take 10 mg by mouth daily.    Historical Provider, MD  cholecalciferol (VITAMIN D) 1000 UNITS tablet Take 1,000 Units by mouth daily.    Historical Provider, MD  diphenhydrAMINE (BENADRYL) 25 mg capsule Take 25 mg by mouth daily as needed for allergies.     Historical Provider, MD  dorzolamide-timolol (COSOPT) 22.3-6.8 MG/ML ophthalmic solution Place 1 drop into the left eye daily.    Historical Provider, MD  ferrous sulfate 325 (65 FE) MG tablet Take 325 mg by mouth daily with breakfast.    Historical Provider, MD  furosemide (LASIX) 40 MG tablet Take 1 tablet (40 mg total) by mouth daily. 11/30/14   Darlin Coco, MD  gabapentin (NEURONTIN) 300 MG capsule Take 600 mg by mouth every evening.     Historical Provider, MD  Inulin (METAMUCIL CLEAR & NATURAL) POWD Take 15 mLs by mouth daily as needed (fiber).  05/18/13   Darlin Coco, MD  latanoprost (XALATAN) 0.005 % ophthalmic solution Place 1 drop into the left eye at bedtime.    Historical Provider, MD  metFORMIN (GLUCOPHAGE) 500 MG tablet Take 500 mg by mouth 2 (two) times daily.  09/07/12   Historical Provider, MD  Multiple Vitamins-Minerals (CENTRUM SILVER PO) Take 1 tablet by mouth daily.     Historical Provider, MD  nitroGLYCERIN (NITROSTAT) 0.4 MG SL tablet Place 1 tablet (0.4 mg total) under the tongue every 5 (five) minutes as needed for chest pain. 05/04/16   Evans Lance, MD  promethazine (PHENERGAN) 25 MG tablet Take 25 mg by mouth every 4 (four) hours as needed. nausea 10/26/15   Historical Provider, MD  ramipril (ALTACE) 1.25 MG capsule TAKE 1 CAPSULE BY MOUTH EVERY DAY 03/22/16   Josue Hector, MD  SYNTHROID 137 MCG tablet Take 137 mcg by mouth daily. 10/15/15   Historical Provider, MD  tamsulosin (FLOMAX) 0.4 MG CAPS capsule Take 0.4 mg by mouth daily after breakfast.  02/02/14   Historical Provider, MD  temazepam (RESTORIL) 30 MG capsule Take 30 mg by  mouth at bedtime as needed. For sleep    Historical Provider, MD     No Known Allergies  ROS:  Out of a complete 14 system review of symptoms, the patient complains only of the following symptoms, and all other reviewed systems are negative.  Weight gain, fatigue Swelling in the legs Hearing loss, spinning and dizzy sensations Shortness of breath Constipation Easy bruising, easy bleeding Feeling hot, cold Allergies, runny nose Memory loss, weakness, dizziness Depression, decreased energy, change in appetite, hallucinations Insomnia, sleepiness  Blood pressure (!) 108/48, pulse Marland Kitchen)  56, height 5\' 6"  (1.676 m), weight 170 lb 8 oz (77.3 kg).  Blood pressure, right arm, standing is 104/48.   Physical Exam  General: The patient is alert and cooperative at the time of the examination.  Eyes: Pupils are equal, round, and reactive to light. Discs are flat bilaterally.  Neck: The neck is supple, no carotid bruits are noted.  Respiratory: The respiratory examination is clear.  Cardiovascular: The cardiovascular examination reveals a regular rate and rhythm, no obvious murmurs or rubs are noted.  Skin: Extremities are with 1+ edema at the ankles.  Neurologic Exam  Mental status: The patient is alert and oriented x 3 at the time of the examination. The patient has apparent normal recent and remote memory, with an apparently normal attention span and concentration ability.  Cranial nerves: Facial symmetry is present. There is good sensation of the face to pinprick and soft touch bilaterally. The strength of the facial muscles and the muscles to head turning and shoulder shrug are normal bilaterally. Speech is well enunciated, no aphasia or dysarthria is noted. Extraocular movements are full. Visual fields are full. The tongue is midline, and the patient has symmetric elevation of the soft palate. No obvious hearing deficits are noted.  Motor: The motor testing reveals 5 over 5  strength of all 4 extremities. Good symmetric motor tone is noted throughout.  Sensory: Sensory testing is intact to pinprick, soft touch, vibration sensation, and position sense on the upper extremities. With the lower extremities, the patient has a stocking pattern pinprick sensory deficit one half way up the legs, impairment of vibration sensation and position sensation is significant in both feet. No evidence of extinction is noted.  Coordination: Cerebellar testing reveals good finger-nose-finger and heel-to-shin bilaterally.  Gait and station: Gait is wide-based, slightly unsteady. Tandem gait is unsteady. Romberg is negative. No drift is seen.  Reflexes: Deep tendon reflexes are symmetric, but are depressed bilaterally. Toes are downgoing bilaterally.   Assessment/Plan:  1. Peripheral neuropathy, likely secondary to diabetes  2. Gait disturbance  3. Episodic dizziness  The patient has reports of dizziness that occur with standing up after sitting a long time, and upright after stooping. The episodes of dizziness likely related to transient drops in blood pressure. The patient has cardiac issues, an ejection fraction documented by 2-D echocardiogram in 2015 was around 25%. The patient is running low blood pressures with systolic pressures of around 100. No significant orthostatic hypotension was documented today, however. The patient also has a peripheral neuropathy that results in a more persistent gait instability problem. The patient likely should be using a cane when outside of the house. Physical therapy in the future may be indicated. The patient will be sent for a CT of the brain, and a carotid Doppler study. Blood work will be done. He will follow-up in about 4 months. The patient indicates that the dizziness has improved over the last several weeks, he is being careful about not standing up too quickly. The patient will need to practice caution with walking.  Jill Alexanders  MD 09/19/2016 2:06 PM  [facility] [address] [place], [place] [address]  Phone [phone] Fax [phone]

## 2016-09-21 LAB — MULTIPLE MYELOMA PANEL, SERUM
ALBUMIN SERPL ELPH-MCNC: 3.4 g/dL (ref 2.9–4.4)
Albumin/Glob SerPl: 1.1 (ref 0.7–1.7)
Alpha 1: 0.3 g/dL (ref 0.0–0.4)
Alpha2 Glob SerPl Elph-Mcnc: 0.8 g/dL (ref 0.4–1.0)
B-Globulin SerPl Elph-Mcnc: 1.2 g/dL (ref 0.7–1.3)
Gamma Glob SerPl Elph-Mcnc: 1 g/dL (ref 0.4–1.8)
Globulin, Total: 3.3 g/dL (ref 2.2–3.9)
IGG (IMMUNOGLOBIN G), SERUM: 1064 mg/dL (ref 700–1600)
IGM (IMMUNOGLOBULIN M), SRM: 91 mg/dL (ref 15–143)
IgA/Immunoglobulin A, Serum: 374 mg/dL (ref 61–437)
TOTAL PROTEIN: 6.7 g/dL (ref 6.0–8.5)

## 2016-09-21 LAB — ANA W/REFLEX: Anti Nuclear Antibody(ANA): NEGATIVE

## 2016-09-21 LAB — B. BURGDORFI ANTIBODIES: Lyme IgG/IgM Ab: <0.91 {ISR} (ref 0.00–0.90)

## 2016-09-21 LAB — ANGIOTENSIN CONVERTING ENZYME

## 2016-09-21 LAB — VITAMIN B12: VITAMIN B 12: 754 pg/mL (ref 211–946)

## 2016-09-24 ENCOUNTER — Telehealth: Payer: Self-pay

## 2016-09-24 NOTE — Telephone Encounter (Signed)
Called pt w/ unremarkable lab results. Verbalized understanding and appreciation for call. 

## 2016-09-24 NOTE — Telephone Encounter (Signed)
-----   Message from Kathrynn Ducking, MD sent at 09/23/2016  9:27 AM EST -----   The blood work results are unremarkable. Please call the patient.  ----- Message ----- From: Lavone Neri Lab Results In Sent: 09/20/2016   7:42 AM To: Kathrynn Ducking, MD

## 2016-09-25 ENCOUNTER — Ambulatory Visit (INDEPENDENT_AMBULATORY_CARE_PROVIDER_SITE_OTHER): Payer: PPO | Admitting: *Deleted

## 2016-09-25 DIAGNOSIS — I255 Ischemic cardiomyopathy: Secondary | ICD-10-CM

## 2016-09-25 NOTE — Progress Notes (Signed)
Remote ICD transmission.   

## 2016-10-01 ENCOUNTER — Telehealth: Payer: Self-pay | Admitting: Neurology

## 2016-10-01 NOTE — Telephone Encounter (Signed)
I called patient. No reason to take a vitamin B12 supplement

## 2016-10-01 NOTE — Telephone Encounter (Signed)
Pt w/ h/o diabetic peripheral neuropathy was seen as a new patient on 09/19/16 due to dizziness and gait disturbance. B12 level was checked and WNL @ 754. Pt has been taking a multivitamin but not a B12 supplement.

## 2016-10-01 NOTE — Telephone Encounter (Signed)
Patient is calling to find out if he is supposed to take Vitamin B-12. If no answer please leave a message.

## 2016-10-03 ENCOUNTER — Encounter: Payer: Self-pay | Admitting: Cardiology

## 2016-10-03 ENCOUNTER — Ambulatory Visit (INDEPENDENT_AMBULATORY_CARE_PROVIDER_SITE_OTHER): Payer: PPO

## 2016-10-03 DIAGNOSIS — R42 Dizziness and giddiness: Secondary | ICD-10-CM

## 2016-10-10 ENCOUNTER — Telehealth: Payer: Self-pay | Admitting: Neurology

## 2016-10-10 NOTE — Telephone Encounter (Signed)
I called patient. The carotid Doppler study shows 50-69% stenosis of the right proximal internal carotid artery. No blockage on the left. Antegrade flow was seen in the vertebral arteries on both sides.  We'll recheck this study next year, the patient will call if he has new symptoms.

## 2016-10-11 LAB — CUP PACEART REMOTE DEVICE CHECK
Battery Remaining Longevity: 122 mo
Brady Statistic RV Percent Paced: 1.31 %
HighPow Impedance: 51 Ohm
Implantable Lead Location: 753860
Implantable Lead Model: 6935
Lead Channel Impedance Value: 304 Ohm
Lead Channel Impedance Value: 323 Ohm
Lead Channel Pacing Threshold Amplitude: 0.875 V
Lead Channel Sensing Intrinsic Amplitude: 8 mV
Lead Channel Setting Pacing Amplitude: 2.5 V
Lead Channel Setting Pacing Pulse Width: 0.4 ms
Lead Channel Setting Sensing Sensitivity: 0.3 mV
MDC IDC LEAD IMPLANT DT: 20090204
MDC IDC MSMT BATTERY VOLTAGE: 3.01 V
MDC IDC MSMT LEADCHNL RV PACING THRESHOLD PULSEWIDTH: 0.4 ms
MDC IDC MSMT LEADCHNL RV SENSING INTR AMPL: 8 mV
MDC IDC PG IMPLANT DT: 20150521
MDC IDC SESS DTM: 20171205083622

## 2016-11-28 DIAGNOSIS — I259 Chronic ischemic heart disease, unspecified: Secondary | ICD-10-CM | POA: Diagnosis not present

## 2016-11-28 DIAGNOSIS — I129 Hypertensive chronic kidney disease with stage 1 through stage 4 chronic kidney disease, or unspecified chronic kidney disease: Secondary | ICD-10-CM | POA: Diagnosis not present

## 2016-11-28 DIAGNOSIS — E785 Hyperlipidemia, unspecified: Secondary | ICD-10-CM | POA: Diagnosis not present

## 2016-11-28 DIAGNOSIS — F5109 Other insomnia not due to a substance or known physiological condition: Secondary | ICD-10-CM | POA: Diagnosis not present

## 2016-11-28 DIAGNOSIS — N182 Chronic kidney disease, stage 2 (mild): Secondary | ICD-10-CM | POA: Diagnosis not present

## 2016-11-28 DIAGNOSIS — E039 Hypothyroidism, unspecified: Secondary | ICD-10-CM | POA: Diagnosis not present

## 2016-11-28 DIAGNOSIS — E1143 Type 2 diabetes mellitus with diabetic autonomic (poly)neuropathy: Secondary | ICD-10-CM | POA: Diagnosis not present

## 2016-11-28 DIAGNOSIS — E1122 Type 2 diabetes mellitus with diabetic chronic kidney disease: Secondary | ICD-10-CM | POA: Diagnosis not present

## 2016-12-16 ENCOUNTER — Other Ambulatory Visit: Payer: Self-pay | Admitting: Cardiovascular Disease

## 2016-12-17 ENCOUNTER — Other Ambulatory Visit: Payer: Self-pay | Admitting: Dermatology

## 2016-12-17 DIAGNOSIS — L570 Actinic keratosis: Secondary | ICD-10-CM | POA: Diagnosis not present

## 2016-12-17 DIAGNOSIS — C44619 Basal cell carcinoma of skin of left upper limb, including shoulder: Secondary | ICD-10-CM | POA: Diagnosis not present

## 2016-12-25 ENCOUNTER — Ambulatory Visit (INDEPENDENT_AMBULATORY_CARE_PROVIDER_SITE_OTHER): Payer: PPO | Admitting: *Deleted

## 2016-12-25 DIAGNOSIS — I255 Ischemic cardiomyopathy: Secondary | ICD-10-CM | POA: Diagnosis not present

## 2016-12-25 NOTE — Progress Notes (Signed)
Remote ICD transmission.   

## 2016-12-26 ENCOUNTER — Encounter: Payer: Self-pay | Admitting: Cardiology

## 2016-12-26 DIAGNOSIS — H401123 Primary open-angle glaucoma, left eye, severe stage: Secondary | ICD-10-CM | POA: Diagnosis not present

## 2016-12-26 LAB — CUP PACEART REMOTE DEVICE CHECK
Battery Voltage: 3.01 V
Date Time Interrogation Session: 20180306072604
HIGH POWER IMPEDANCE MEASURED VALUE: 60 Ohm
Implantable Lead Implant Date: 20090204
Implantable Pulse Generator Implant Date: 20150521
Lead Channel Pacing Threshold Amplitude: 1 V
Lead Channel Pacing Threshold Pulse Width: 0.4 ms
Lead Channel Sensing Intrinsic Amplitude: 8.25 mV
Lead Channel Sensing Intrinsic Amplitude: 8.25 mV
Lead Channel Setting Pacing Amplitude: 2.5 V
Lead Channel Setting Pacing Pulse Width: 0.4 ms
MDC IDC LEAD LOCATION: 753860
MDC IDC MSMT BATTERY REMAINING LONGEVITY: 120 mo
MDC IDC MSMT LEADCHNL RV IMPEDANCE VALUE: 323 Ohm
MDC IDC MSMT LEADCHNL RV IMPEDANCE VALUE: 380 Ohm
MDC IDC SET LEADCHNL RV SENSING SENSITIVITY: 0.3 mV
MDC IDC STAT BRADY RV PERCENT PACED: 0.51 %

## 2017-01-10 ENCOUNTER — Other Ambulatory Visit: Payer: Self-pay | Admitting: Cardiovascular Disease

## 2017-01-24 ENCOUNTER — Telehealth: Payer: Self-pay | Admitting: Cardiovascular Disease

## 2017-01-24 NOTE — Telephone Encounter (Signed)
Patient calling states that he has to have a CT scan and states that he was told that he could not have body scans. Please call to verify if this would be okay, thanks.

## 2017-01-24 NOTE — Telephone Encounter (Signed)
Spoke with patient and explained that it would be safe for him to have a CT scan if necessary. Patient verbalized understanding

## 2017-01-28 ENCOUNTER — Ambulatory Visit (INDEPENDENT_AMBULATORY_CARE_PROVIDER_SITE_OTHER): Payer: PPO | Admitting: Neurology

## 2017-01-28 ENCOUNTER — Encounter: Payer: Self-pay | Admitting: Neurology

## 2017-01-28 ENCOUNTER — Encounter (INDEPENDENT_AMBULATORY_CARE_PROVIDER_SITE_OTHER): Payer: Self-pay

## 2017-01-28 VITALS — BP 122/52 | HR 56 | Ht 66.0 in | Wt 180.0 lb

## 2017-01-28 DIAGNOSIS — E1142 Type 2 diabetes mellitus with diabetic polyneuropathy: Secondary | ICD-10-CM | POA: Diagnosis not present

## 2017-01-28 DIAGNOSIS — R269 Unspecified abnormalities of gait and mobility: Secondary | ICD-10-CM

## 2017-01-28 NOTE — Progress Notes (Signed)
Reason for visit: Dizziness  Gregory [name] is an 83 y.o. male  History of present illness:  Gregory [name] is an 83 year old left-handed white male with a history of diabetes and a diabetic neuropathy with a history of dizziness that occurs with standing. The patient is on gabapentin for his peripheral neuropathy, but he believes that his dizziness started before he started on the gabapentin. Over time he has had improvement in his symptoms, he occasionally may get dizzy when he stands, but he has not had any falls. The dizziness is minimal at this time. He has undergone a carotid Doppler study that showed 50-69% stenosis of the right internal carotid artery, this will be followed over time. CT of the brain was ordered, it has not been done yet, the patient claims that it is to be done on April 16. The patient returns for an evaluation.  Past Medical History:  Diagnosis Date  . Abnormality of gait 09/19/2016  . Allergy   . Cataract   . CHF (congestive heart failure) ([place])   . Colon polyps   . Diabetes mellitus   . Diabetic neuropathy ([place])   . Diverticulosis   . Dyslipidemia   . Essential hypertension   . Glaucoma   . Hyperlipidemia   . Hypothyroidism   . ICD (implantable cardiac defibrillator) in place 11-2004  . Internal hemorrhoid   . Ischemic cardiomyopathy   . Ischemic heart disease   . Myocardial infarction 2006   LARGE ANTERIOR WALL    Past Surgical History:  Procedure Laterality Date  . CARDIAC DEFIBRILLATOR PLACEMENT    . CATARACT EXTRACTION     left  . CHOLECYSTECTOMY    . CHOLECYSTECTOMY, LAPAROSCOPIC    . COLONOSCOPY N/A 12/01/2012   Procedure: COLONOSCOPY;  Surgeon: Irene Shipper, MD;  Location: [facility];  Service: Endoscopy;  Laterality: N/A;  . CORONARY ARTERY BYPASS GRAFT  12/02/04  . ESOPHAGOGASTRODUODENOSCOPY N/A 12/01/2012   Procedure: ESOPHAGOGASTRODUODENOSCOPY (EGD);  Surgeon: Irene Shipper, MD;  Location: [facility];  Service: Endoscopy;   Laterality: N/A;  . 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 IMPLANTABLE CARDIOVERTER DEFIBRILLATOR (ICD) GENERATOR CHANGE N/A 03/11/2014   Procedure: ICD GENERATOR CHANGE;  Surgeon: Evans Lance, MD;  Location: [facility] CATH LAB;  Service: Cardiovascular;  Laterality: N/A;  . SHOULDER SURGERY     RIGHT  . TONSILLECTOMY      Family History  Problem Relation Age of Onset  . Diabetes Mother   . Prostate cancer Father   . Cancer Daughter     ? unknown, in stomach  . Diabetes    . Colon cancer Neg Hx   . Esophageal cancer Neg Hx   . Rectal cancer Neg Hx   . Stomach cancer Neg Hx     Social history:  reports that he quit smoking about 12 years ago. His smoking use included Cigarettes. He has never used smokeless tobacco. He reports that he does not drink alcohol or use drugs.   No Known Allergies  Medications:  Prior to Admission medications   Medication Sig Start Date End Date Taking? Authorizing Provider  acetaminophen (TYLENOL) 500 MG tablet Take 500 mg by mouth every 6 (six) hours as needed. For pain   Yes Historical Provider, MD  amiodarone (PACERONE) 200 MG tablet TAKE 1/2 TABLET BY MOUTH EVERY MORNING 01/10/17  Yes Josue Hector, MD  aspirin EC 325 MG tablet Take 325 mg by mouth every morning.  Yes Historical Provider, MD  atorvastatin (LIPITOR) 20 MG tablet Take 1 tablet (20 mg total) by mouth daily. 07/23/16  Yes Josue Hector, MD  calcium carbonate (TUMS EX) 750 MG chewable tablet Chew 1 tablet by mouth daily.    Yes Historical Provider, MD  carvedilol (COREG) 12.5 MG tablet Take 1 tablet (12.5 mg total) by mouth 2 (two) times daily with a meal. 07/23/16  Yes Josue Hector, MD  cetirizine (ZYRTEC) 10 MG tablet Take 10 mg by mouth daily.   Yes Historical Provider, MD  cholecalciferol (VITAMIN D) 1000 UNITS tablet Take 1,000 Units by mouth daily.   Yes Historical Provider, MD  diphenhydrAMINE (BENADRYL) 25 mg capsule Take 25 mg by mouth daily as needed for  allergies.    Yes Historical Provider, MD  dorzolamide-timolol (COSOPT) 22.3-6.8 MG/ML ophthalmic solution Place 1 drop into the left eye daily.   Yes Historical Provider, MD  ferrous sulfate 325 (65 FE) MG tablet Take 325 mg by mouth daily with breakfast.   Yes Historical Provider, MD  furosemide (LASIX) 40 MG tablet Take 1 tablet (40 mg total) by mouth daily. 11/30/14  Yes Darlin Coco, MD  gabapentin (NEURONTIN) 300 MG capsule Take 300 mg by mouth 3 (three) times daily.    Yes Historical Provider, MD  Inulin (METAMUCIL CLEAR & NATURAL) POWD Take 15 mLs by mouth daily as needed (fiber).  05/18/13  Yes Darlin Coco, MD  latanoprost (XALATAN) 0.005 % ophthalmic solution Place 1 drop into the left eye at bedtime.   Yes Historical Provider, MD  metFORMIN (GLUCOPHAGE) 500 MG tablet Take 500 mg by mouth 2 (two) times daily.  09/07/12  Yes Historical Provider, MD  Multiple Vitamins-Minerals (CENTRUM SILVER PO) Take 1 tablet by mouth daily.    Yes Historical Provider, MD  nitroGLYCERIN (NITROSTAT) 0.4 MG SL tablet Place 1 tablet (0.4 mg total) under the tongue every 5 (five) minutes as needed for chest pain. 05/04/16  Yes Evans Lance, MD  promethazine (PHENERGAN) 25 MG tablet Take 25 mg by mouth every 4 (four) hours as needed. nausea 10/26/15  Yes Historical Provider, MD  ramipril (ALTACE) 1.25 MG capsule TAKE 1 CAPSULE BY MOUTH EVERY DAY 12/17/16  Yes Josue Hector, MD  SYNTHROID 137 MCG tablet Take 137 mcg by mouth daily. 10/15/15  Yes Historical Provider, MD  tamsulosin (FLOMAX) 0.4 MG CAPS capsule Take 0.4 mg by mouth daily after breakfast.  02/02/14  Yes Historical Provider, MD  temazepam (RESTORIL) 30 MG capsule Take 30 mg by mouth at bedtime as needed. For sleep   Yes Historical Provider, MD    ROS:  Out of a complete 14 system review of symptoms, the patient complains only of the following symptoms, and all other reviewed systems are negative.  Hearing loss, runny nose Light  sensitivity Daytime sleepiness Walking difficulty Bruising easily Dizziness, weakness Confusion  Blood pressure (!) 122/52, pulse (!) 56, height 5\' 6"  (1.676 m), weight 180 lb (81.6 kg).  Physical Exam  General: The patient is alert and cooperative at the time of the examination. The patient is moderately obese.  Skin: No significant peripheral edema is noted.   Neurologic Exam  Mental status: The patient is alert and oriented x 3 at the time of the examination. The patient has apparent normal recent and remote memory, with an apparently normal attention span and concentration ability.   Cranial nerves: Facial symmetry is present. Speech is normal, no aphasia or dysarthria is noted. Extraocular movements are  full. Visual fields are full. The patient is blind in the right eye, the right eye is patched.  Motor: The patient has good strength in all 4 extremities.  Sensory examination: Soft touch sensation is symmetric on the face, arms, and legs.  Coordination: The patient has good finger-nose-finger and heel-to-shin bilaterally.  Gait and station: The patient has a slightly wide-based gait, the patient is able to walk without assistance. Tandem gait is slightly unsteady. Romberg is negative. No drift is seen.  Reflexes: Deep tendon reflexes are symmetric.   Assessment/Plan:  1. Episodic dizziness  2. Diabetes, mild diabetic peripheral neuropathy  3. Gait instability  The patient is doing better with his symptoms of dizziness. I will contact him once the CT of the head is done. We will follow the carotid Doppler study over time. He will follow-up if needed, he remains on low-dose aspirin at this time.  Jill Alexanders MD 01/28/2017 12:08 PM  [facility] [address] [place], [place] [address]  Phone [phone] Fax [phone]

## 2017-02-04 ENCOUNTER — Ambulatory Visit
Admission: RE | Admit: 2017-02-04 | Discharge: 2017-02-04 | Disposition: A | Discharge status: Home / Self Care | Payer: PPO | Source: Ambulatory Visit | Attending: Neurology | Admitting: Neurology

## 2017-02-04 DIAGNOSIS — R42 Dizziness and giddiness: Secondary | ICD-10-CM

## 2017-02-05 ENCOUNTER — Telehealth: Payer: Self-pay | Admitting: Neurology

## 2017-02-05 NOTE — Telephone Encounter (Signed)
I called patient. CT the head is unremarkable, mild cortical atrophy is seen. The patient is to contact our office if his symptoms worsen.   CT head 02/05/17:  IMPRESSION:  This CT scan of the head without contrast shows the following: 1.   Mild to moderate cortical atrophy. 2.   Calcification is noted within the vertebral and carotid arteries. 3.   There are no acute findings.

## 2017-02-25 DIAGNOSIS — R5383 Other fatigue: Secondary | ICD-10-CM | POA: Diagnosis not present

## 2017-02-25 DIAGNOSIS — R270 Ataxia, unspecified: Secondary | ICD-10-CM | POA: Diagnosis not present

## 2017-02-25 DIAGNOSIS — D649 Anemia, unspecified: Secondary | ICD-10-CM | POA: Diagnosis not present

## 2017-03-12 DIAGNOSIS — R7989 Other specified abnormal findings of blood chemistry: Secondary | ICD-10-CM | POA: Diagnosis not present

## 2017-04-10 DIAGNOSIS — H6123 Impacted cerumen, bilateral: Secondary | ICD-10-CM | POA: Diagnosis not present

## 2017-04-12 ENCOUNTER — Encounter: Payer: PPO | Admitting: Internal Medicine

## 2017-04-22 ENCOUNTER — Encounter: Payer: PPO | Admitting: Internal Medicine

## 2017-05-10 DIAGNOSIS — H60392 Other infective otitis externa, left ear: Secondary | ICD-10-CM | POA: Diagnosis not present

## 2017-05-29 DIAGNOSIS — E039 Hypothyroidism, unspecified: Secondary | ICD-10-CM | POA: Diagnosis not present

## 2017-05-29 DIAGNOSIS — E785 Hyperlipidemia, unspecified: Secondary | ICD-10-CM | POA: Diagnosis not present

## 2017-05-29 DIAGNOSIS — E7800 Pure hypercholesterolemia, unspecified: Secondary | ICD-10-CM | POA: Diagnosis not present

## 2017-05-29 DIAGNOSIS — E1143 Type 2 diabetes mellitus with diabetic autonomic (poly)neuropathy: Secondary | ICD-10-CM | POA: Diagnosis not present

## 2017-06-06 DIAGNOSIS — G8929 Other chronic pain: Secondary | ICD-10-CM | POA: Diagnosis not present

## 2017-06-06 DIAGNOSIS — W1800XA Striking against unspecified object with subsequent fall, initial encounter: Secondary | ICD-10-CM | POA: Diagnosis not present

## 2017-06-06 DIAGNOSIS — M545 Low back pain: Secondary | ICD-10-CM | POA: Diagnosis not present

## 2017-06-14 ENCOUNTER — Encounter: Payer: Self-pay | Admitting: Internal Medicine

## 2017-06-14 ENCOUNTER — Ambulatory Visit: Payer: PPO | Admitting: Internal Medicine

## 2017-06-14 VITALS — HR 53 | Ht 66.0 in | Wt 168.6 lb

## 2017-06-14 DIAGNOSIS — I255 Ischemic cardiomyopathy: Secondary | ICD-10-CM | POA: Diagnosis not present

## 2017-06-14 NOTE — Progress Notes (Signed)
HPI [name] returns today for follow-up of his chronic systolic heart failure, status post ICD implantation. He is an 83 year old man with chronic systolic heart failure, status post anterior myocardial infarction on most 10 years ago, status post ICD insertion. The patient describes an episode where he fell while he was at [employer]. He does not think he injured himself. His heart failure symptoms remain class II. He has become quite sedentary. No Known Allergies   Current Outpatient Prescriptions  Medication Sig Dispense Refill  . acetaminophen (TYLENOL) 500 MG tablet Take 500 mg by mouth every 6 (six) hours as needed. For pain    . amiodarone (PACERONE) 200 MG tablet TAKE 1/2 TABLET BY MOUTH EVERY MORNING 45 tablet 1  . aspirin EC 325 MG tablet Take 325 mg by mouth every morning.    Marland Kitchen atorvastatin (LIPITOR) 20 MG tablet Take 1 tablet (20 mg total) by mouth daily. 90 tablet 3  . calcium carbonate (TUMS EX) 750 MG chewable tablet Chew 1 tablet by mouth daily.     . carvedilol (COREG) 12.5 MG tablet Take 1 tablet (12.5 mg total) by mouth 2 (two) times daily with a meal. 180 tablet 3  . cetirizine (ZYRTEC) 10 MG tablet Take 10 mg by mouth daily.    . cholecalciferol (VITAMIN D) 1000 UNITS tablet Take 1,000 Units by mouth daily.    . diphenhydrAMINE (BENADRYL) 25 mg capsule Take 25 mg by mouth daily as needed for allergies.     Marland Kitchen dorzolamide-timolol (COSOPT) 22.3-6.8 MG/ML ophthalmic solution Place 1 drop into the left eye daily.    . ferrous sulfate 325 (65 FE) MG tablet Take 325 mg by mouth daily with breakfast.    . furosemide (LASIX) 40 MG tablet Take 1 tablet (40 mg total) by mouth daily. 90 tablet 1  . gabapentin (NEURONTIN) 300 MG capsule Take 300 mg by mouth 3 (three) times daily.     . Inulin (METAMUCIL CLEAR & NATURAL) POWD Take 15 mLs by mouth daily as needed (fiber).     . latanoprost (XALATAN) 0.005 % ophthalmic solution Place 1 drop into the left eye at bedtime.    .  metFORMIN (GLUCOPHAGE) 500 MG tablet Take 500 mg by mouth 2 (two) times daily.     . Multiple Vitamins-Minerals (CENTRUM SILVER PO) Take 1 tablet by mouth daily.     . nitroGLYCERIN (NITROSTAT) 0.4 MG SL tablet Place 1 tablet (0.4 mg total) under the tongue every 5 (five) minutes as needed for chest pain. 25 tablet 2  . promethazine (PHENERGAN) 25 MG tablet Take 25 mg by mouth every 4 (four) hours as needed. nausea  0  . ramipril (ALTACE) 1.25 MG capsule TAKE 1 CAPSULE BY MOUTH EVERY DAY 90 capsule 2  . SYNTHROID 137 MCG tablet Take 137 mcg by mouth daily.  0  . tamsulosin (FLOMAX) 0.4 MG CAPS capsule Take 0.4 mg by mouth daily after breakfast.     . temazepam (RESTORIL) 30 MG capsule Take 30 mg by mouth at bedtime as needed. For sleep     No current facility-administered medications for this visit.      Past Medical History:  Diagnosis Date  . Abnormality of gait 09/19/2016  . Allergy   . Cataract   . CHF (congestive heart failure) ([place])   . Colon polyps   . Diabetes mellitus   . Diabetic neuropathy ([place])   . Diverticulosis   . Dyslipidemia   . Essential hypertension   .  Glaucoma   . Hyperlipidemia   . Hypothyroidism   . ICD (implantable cardiac defibrillator) in place 11-2004  . Internal hemorrhoid   . Ischemic cardiomyopathy   . Ischemic heart disease   . Myocardial infarction ([place]) 2006   LARGE ANTERIOR WALL    ROS:   All systems reviewed and negative except as noted in the HPI.   Past Surgical History:  Procedure Laterality Date  . CARDIAC DEFIBRILLATOR PLACEMENT    . CATARACT EXTRACTION     left  . CHOLECYSTECTOMY    . CHOLECYSTECTOMY, LAPAROSCOPIC    . COLONOSCOPY N/A 12/01/2012   Procedure: COLONOSCOPY;  Surgeon: Gregory Shipper, MD;  Location: [facility];  Service: Endoscopy;  Laterality: N/A;  . CORONARY ARTERY BYPASS GRAFT  12/02/04  . ESOPHAGOGASTRODUODENOSCOPY N/A 12/01/2012   Procedure: ESOPHAGOGASTRODUODENOSCOPY (EGD);  Surgeon: Gregory Shipper, MD;   Location: [facility];  Service: Endoscopy;  Laterality: N/A;  . 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 IMPLANTABLE CARDIOVERTER DEFIBRILLATOR (ICD) GENERATOR CHANGE N/A 03/11/2014   Procedure: ICD GENERATOR CHANGE;  Surgeon: Gregory Lance, MD;  Location: [facility] CATH LAB;  Service: Cardiovascular;  Laterality: N/A;  . SHOULDER SURGERY     RIGHT  . TONSILLECTOMY       Family History  Problem Relation Age of Onset  . Diabetes Mother   . Prostate cancer Father   . Cancer Daughter        ? unknown, in stomach  . Diabetes Unknown   . Colon cancer Neg Hx   . Esophageal cancer Neg Hx   . Rectal cancer Neg Hx   . Stomach cancer Neg Hx      Social History   Social History  . Marital status: Married    Spouse name: N/A  . Number of children: 1  . Years of education: 75   Occupational History  . retired    Social History Main Topics  . Smoking status: Former Smoker    Types: Cigarettes    Quit date: 10/22/2004  . Smokeless tobacco: Never Used     Comment: quit smoking pipe/cigar in 2006  . Alcohol use No  . Drug use: No  . Sexual activity: Not on file   Other Topics Concern  . Not on file   Social History Narrative   Lives at home w/ his wife    Left-handed   Caffeine: 1 cup decaf coffee in the morning     Pulse (!) 53   Ht 5\' 6"  (1.676 m)   Wt 168 lb 9.6 oz (76.5 kg)   SpO2 92%   BMI 27.21 kg/m   Physical Exam:  Well appearing 83 year old man, NAD HEENT: Unremarkable, except for a patch over his right eye. Neck:  6 cm JVD, no thyromegally Lymphatics:  No adenopathy Back:  No CVA tenderness Lungs:  Clear, with no wheezes, rales, or rhonchi. HEART:  Regular rate rhythm, no murmurs, no rubs, no clicks Abd:  soft, positive bowel sounds, no organomegally, no rebound, no guarding Ext:  2 plus pulses, no edema, no cyanosis, no clubbing Skin:  No rashes no nodules Neuro:  CN II through XII intact, motor grossly intact   DEVICE  Normal device  function.  See PaceArt for details.   Assess/Plan: 1. Chronic systolic heart failure - his symptoms are class II. He will continue his current medications. 2. Unexplained falls - interrogation of his ICD demonstrates no arrhythmias. I suspect he had orthostasis.  He did not injure himself. He will undergo watchful waiting. 3. ICD - his Medtronic single-chamber ICD is working normally. We'll plan to recheck in several months. He is encouraged to increase his physical activity.

## 2017-06-14 NOTE — Patient Instructions (Signed)
Medication Instructions:  Your physician recommends that you continue on your current medications as directed. Please refer to the Current Medication list given to you today.  Labwork: None ordered.  Testing/Procedures: None ordered.  Follow-Up: Your physician wants you to follow-up in: one year with Dr. Lovena Le.   You will receive a reminder letter in the mail two months in advance. If you don't receive a letter, please call our office to schedule the follow-up appointment.  Remote monitoring is used to monitor your ICD from home. This monitoring reduces the number of office visits required to check your device to one time per year. It allows [place] to keep an eye on the functioning of your device to ensure it is working properly. You are scheduled for a device check from home on 07/22/2017. You may send your transmission at any time that day. If you have a wireless device, the transmission will be sent automatically. After your physician reviews your transmission, you will receive a postcard with your next transmission date.   Any Other Special Instructions Will Be Listed Below (If Applicable).     If you need a refill on your cardiac medications before your next appointment, please call your pharmacy.

## 2017-06-18 DIAGNOSIS — H52222 Regular astigmatism, left eye: Secondary | ICD-10-CM | POA: Diagnosis not present

## 2017-06-18 DIAGNOSIS — E119 Type 2 diabetes mellitus without complications: Secondary | ICD-10-CM | POA: Diagnosis not present

## 2017-06-18 DIAGNOSIS — H5202 Hypermetropia, left eye: Secondary | ICD-10-CM | POA: Diagnosis not present

## 2017-06-18 DIAGNOSIS — H401123 Primary open-angle glaucoma, left eye, severe stage: Secondary | ICD-10-CM | POA: Diagnosis not present

## 2017-06-18 DIAGNOSIS — Z961 Presence of intraocular lens: Secondary | ICD-10-CM | POA: Diagnosis not present

## 2017-06-18 DIAGNOSIS — H26492 Other secondary cataract, left eye: Secondary | ICD-10-CM | POA: Diagnosis not present

## 2017-06-26 ENCOUNTER — Other Ambulatory Visit: Payer: Self-pay | Admitting: Cardiovascular Disease

## 2017-06-28 ENCOUNTER — Other Ambulatory Visit: Payer: Self-pay | Admitting: Cardiovascular Disease

## 2017-06-29 ENCOUNTER — Other Ambulatory Visit: Payer: Self-pay | Admitting: Cardiovascular Disease

## 2017-07-22 ENCOUNTER — Ambulatory Visit (INDEPENDENT_AMBULATORY_CARE_PROVIDER_SITE_OTHER): Payer: PPO | Admitting: *Deleted

## 2017-07-22 DIAGNOSIS — I255 Ischemic cardiomyopathy: Secondary | ICD-10-CM | POA: Diagnosis not present

## 2017-07-23 LAB — CUP PACEART REMOTE DEVICE CHECK
Battery Remaining Longevity: 114 mo
HighPow Impedance: 49 Ohm
Implantable Lead Implant Date: 20090204
Implantable Lead Location: 753860
Implantable Pulse Generator Implant Date: 20150521
Lead Channel Impedance Value: 342 Ohm
Lead Channel Pacing Threshold Amplitude: 1 V
Lead Channel Pacing Threshold Pulse Width: 0.4 ms
Lead Channel Sensing Intrinsic Amplitude: 8.25 mV
Lead Channel Setting Pacing Pulse Width: 0.4 ms
Lead Channel Setting Sensing Sensitivity: 0.3 mV
MDC IDC MSMT BATTERY VOLTAGE: 3.01 V
MDC IDC MSMT LEADCHNL RV IMPEDANCE VALUE: 304 Ohm
MDC IDC MSMT LEADCHNL RV SENSING INTR AMPL: 8.25 mV
MDC IDC SESS DTM: 20181001083824
MDC IDC SET LEADCHNL RV PACING AMPLITUDE: 2.5 V
MDC IDC STAT BRADY RV PERCENT PACED: 1.31 %

## 2017-07-23 NOTE — Progress Notes (Signed)
Remote ICD transmission.   

## 2017-07-24 ENCOUNTER — Encounter: Payer: Self-pay | Admitting: Cardiology

## 2017-08-05 DIAGNOSIS — F5109 Other insomnia not due to a substance or known physiological condition: Secondary | ICD-10-CM | POA: Diagnosis not present

## 2017-08-05 DIAGNOSIS — Z9181 History of falling: Secondary | ICD-10-CM | POA: Diagnosis not present

## 2017-08-05 DIAGNOSIS — Z23 Encounter for immunization: Secondary | ICD-10-CM | POA: Diagnosis not present

## 2017-08-05 DIAGNOSIS — R270 Ataxia, unspecified: Secondary | ICD-10-CM | POA: Diagnosis not present

## 2017-08-05 DIAGNOSIS — E039 Hypothyroidism, unspecified: Secondary | ICD-10-CM | POA: Diagnosis not present

## 2017-08-13 ENCOUNTER — Ambulatory Visit: Payer: PPO | Admitting: Physical Therapy

## 2017-08-14 ENCOUNTER — Ambulatory Visit: Payer: PPO | Admitting: Physical Therapy

## 2017-08-20 ENCOUNTER — Ambulatory Visit: Payer: PPO | Attending: Family Medicine | Admitting: Physical Therapy

## 2017-08-20 DIAGNOSIS — R296 Repeated falls: Secondary | ICD-10-CM | POA: Insufficient documentation

## 2017-08-20 DIAGNOSIS — M6281 Muscle weakness (generalized): Secondary | ICD-10-CM | POA: Insufficient documentation

## 2017-08-20 DIAGNOSIS — R262 Difficulty in walking, not elsewhere classified: Secondary | ICD-10-CM | POA: Diagnosis not present

## 2017-08-20 NOTE — Therapy (Addendum)
[facility] [address], [place], [place], [address] Phone: [phone]   Fax:  [phone]  Physical Therapy Evaluation  Patient Details  Name: Gregory [name] MRN: [id number] Date of Birth: 02-Apr-1933 Referring Provider: Orlie Dakin  Encounter Date: 08/20/2017      PT End of Session - 08/20/17 1538    Visit Number 1   Number of Visits 10   Date for PT Re-Evaluation 10/15/17   Authorization Type medicare gcodes 10 visits, KX at 19   PT Start Time 1538   PT Stop Time 1616   PT Time Calculation (min) 38 min   Activity Tolerance Patient tolerated treatment well      Past Medical History:  Diagnosis Date  . Abnormality of gait 09/19/2016  . Allergy   . Cataract   . CHF (congestive heart failure) ([place])   . Colon polyps   . Diabetes mellitus   . Diabetic neuropathy ([place])   . Diverticulosis   . Dyslipidemia   . Essential hypertension   . Glaucoma   . Hyperlipidemia   . Hypothyroidism   . ICD (implantable cardiac defibrillator) in place 11-2004  . Internal hemorrhoid   . Ischemic cardiomyopathy   . Ischemic heart disease   . Myocardial infarction [facility]) 2006   LARGE ANTERIOR WALL    Past Surgical History:  Procedure Laterality Date  . CARDIAC DEFIBRILLATOR PLACEMENT    . CATARACT EXTRACTION     left  . CHOLECYSTECTOMY    . CHOLECYSTECTOMY, LAPAROSCOPIC    . COLONOSCOPY N/A 12/01/2012   Procedure: COLONOSCOPY;  Surgeon: Irene Shipper, MD;  Location: [facility];  Service: Endoscopy;  Laterality: N/A;  . CORONARY ARTERY BYPASS GRAFT  12/02/04  . ESOPHAGOGASTRODUODENOSCOPY N/A 12/01/2012   Procedure: ESOPHAGOGASTRODUODENOSCOPY (EGD);  Surgeon: Irene Shipper, MD;  Location: [facility];  Service: Endoscopy;  Laterality: N/A;  . 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 IMPLANTABLE CARDIOVERTER DEFIBRILLATOR (ICD) GENERATOR CHANGE N/A 03/11/2014   Procedure: ICD GENERATOR CHANGE;   Surgeon: Evans Lance, MD;  Location: [facility] CATH LAB;  Service: Cardiovascular;  Laterality: N/A;  . SHOULDER SURGERY     RIGHT  . TONSILLECTOMY      There were no vitals filed for this visit.       Subjective Assessment - 08/20/17 1548    Subjective Pt has been falling and balance has been worse in the last 6 months. Walking up steps is very hard.  Pt states he is going much more slowly and using the rail to get up steps and is not able to walk like he used to.     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 No/denies            [facility] PT Assessment - 08/20/17 0001      Assessment   Medical Diagnosis R27.0 (ICD-10-CM) - Ataxia   Referring Provider Christain [place], M   Onset Date/Surgical Date --  6 months ago   Prior Therapy no     Precautions   Precautions None     Restrictions   Weight Bearing Restrictions No     Balance Screen   Has the patient fallen in the past 6  months Yes   How many times? 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  Yes   Is the patient reluctant to leave their home because of a fear of falling?  Yes     [place] Private residence   Living Arrangements Spouse/significant other   [place] to enter   Entrance Stairs-Number of Steps 5   Entrance Stairs-Rails Right;Left;Can reach both   [place] One level  mobile home     Prior Function   Level of [place] with basic ADLs  sometimes hard to get compression socks     Cognition   Overall Cognitive Status Within Functional Limits for tasks assessed     Posture/Postural Control   Posture/Postural Control Postural limitations   Postural Limitations Rounded  Shoulders;Forward head     ROM / Strength   AROM / PROM / Strength Strength     Strength   Overall Strength Deficits   Strength Assessment Site Knee;Hip;Ankle   Right Hip Flexion 3+/5   Right Hip ABduction 4-/5   Right Hip ADduction 4/5   Left Hip Flexion 4-/5   Left Hip ABduction 4/5   Left Hip ADduction 4-/5   Right/Left Knee Right;Left   Right Knee Flexion 4-/5   Right Knee Extension 4/5   Left Knee Flexion 4-/5   Left Knee Extension 4/5   Right/Left Ankle Right;Left   Right Ankle Dorsiflexion 3/5   Left Ankle Dorsiflexion 3/5     Ambulation/Gait   Gait Pattern Decreased stride length;Wide base of support;Decreased step length - right;Decreased step length - left      five x sit to stand: 39 sec using UE and legs to push against chair TUG: 34 sec using SPC      Objective measurements completed on examination: See above findings.                  PT Education - 08/20/17 1652    Education provided Yes   Education Details seated: LAQ, ball squeeze, clam w yellow band, heel/toe raises   Person(s) Educated Patient;Spouse   Methods Explanation;Demonstration;Verbal cues;Handout   Comprehension Verbalized understanding;Returned demonstration          PT Short Term Goals - 08/20/17 1648      PT SHORT TERM GOAL #1   Title pt will be ind with initial HEP   Time 4   Period Weeks   Status New   Target Date 09/17/17     PT SHORT TERM GOAL #2   Title pt will be able to begin walking routine and be able to walk 6 minutes without taking a break   Time 4   Period Weeks   Status New   Target Date 09/17/17     PT SHORT TERM GOAL #3   Title pt will perform sit to stand with minimal UE assistance </= to 32 seconds   Time 4   Period Weeks   Status New   Target Date 09/17/17           PT Long Term Goals - 08/20/17 1554      PT LONG TERM GOAL #1   Title pt will have increased endurance to be able to tolerate walking 30-45 minutes around the store.    Time 8   Period Weeks   Status New   Target Date 10/15/17  PT LONG TERM GOAL #2   Title pt is able to walk up the stiars 50% more easily due to increased LE strength   Time 8   Period Weeks   Status New   Target Date 10/15/17     PT LONG TERM GOAL #3   Title TUG </= to 25 sec for decreased risk of falls   Time 8   Period Weeks   Status New   Target Date 10/15/17     PT LONG TERM GOAL #4   Title able to do sit to stand 5 x without using back of legs against the chair and min to no UE assistance   Time 8   Period Weeks   Status New   Target Date 10/15/17                Plan - 09-03-2017 1657    Clinical Impression Statement Patient presented to clinic with his wife.  He has fallen 2x in the last 6 months and has experienced a decline in activity.  Pt currently using a cane starting 4 months ago when his balance worsened. He demonstrates bilateral LE weakness.  He also needs 10 seconds after standing to regain his balance.  He demonstrates increased fall risk due to slower than normal TUG and 5X sit to stand times as shown in measures.  He also has difficulty getting up from seated position without using LE and UE to push against the chair.  Pt will benefit from skilled PT to address these impairments and return patient to full functional activities.   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; Rt shoulder surgery   PT Frequency 2x / week   PT Duration 8 weeks   PT Treatment/Interventions ADLs/Self Care Home Management;Biofeedback;Cryotherapy;Electrical Stimulation;Moist Heat;Ultrasound;Gait training;Stair training;Functional mobility training;Therapeutic activities;Therapeutic  exercise;Balance training;Neuromuscular re-education;Patient/family education;Dry needling;Manual techniques;Scar mobilization;Passive range of motion;Taping   PT Next Visit Plan nustep, endurance, balance, strength, gait activities, stairs   Consulted and Agree with Plan of Care Patient      Patient will benefit from skilled therapeutic intervention in order to improve the following deficits and impairments:  Abnormal gait, Decreased balance, Decreased strength, Decreased endurance, Difficulty walking, Postural dysfunction  Visit Diagnosis: Difficulty in walking, not elsewhere classified  Muscle weakness (generalized)  Repeated falls      G-Codes - 2017-09-03 1644    Functional Assessment Tool Used (Outpatient Only) TUG, 5 x sit to stand, clinical impression   Functional Limitation Mobility: Walking and moving around   Mobility: Walking and Moving Around Current Status [phone]) At least 60 percent but less than 80 percent impaired, limited or restricted   Mobility: Walking and Moving Around Goal Status [phone]) At least 60 percent but less than 80 percent impaired, limited or restricted       Problem List Patient Active Problem List   Diagnosis Date Noted  . Abnormality of gait 09/19/2016  . Unexplained night sweats 03/31/2015  . Constipation 05/18/2013  . Ischemic heart disease   . Myocardial infarction ([place])   . Hypothyroidism   . Diabetes mellitus   . Dyslipidemia   . Benign hypertensive heart disease without heart failure   . Ischemic cardiomyopathy   . Diabetic neuropathy (  [place])   . HYPOTHYROIDISM 03/15/2009  . DYSLIPIDEMIA 03/15/2009  . 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 AUTOMATIC IMPLANTABLE CARDIAC DEFIBRILLATOR SITU 03/15/2009  . DM 02/02/2008  . DEPRESSION 02/02/2008  . MI 02/02/2008  . CAD 02/02/2008  . CHF 02/02/2008    Zannie Cove, PT 08/20/2017, 5:09 PM  [facility] [address], [place], [place], [address] Phone: [phone]   Fax:  [phone]  Name: Gregory [name] MRN: [id number] Date of Birth: 11/17/1932

## 2017-08-20 NOTE — Patient Instructions (Addendum)
   LONG ARC QUAD - LAQ - HIGH SEAT  While seated with your knee in a bent position, slowly straighten your knee as you raise your foot upwards as shown.  Repeat 2x 10 each side   Seated Dorsi Flexion   Start with your feet on the ground.  Next, raise up both forefeet and toes as shown as you bend at your ankle.  Keep your heels on the ground the entire time. Repeat 20x     Seated calf raises  Sit towards the edge of a chair or bed with feet flat on the floor. Lift your heels up so that you are on your toes. Return to start and repeat.  Repeat 20x    BALL SQUEEZE - SEATED  While sitting, place a ball between your knees. Squeeze the ball with your knees and hold 5 sec, repeat 10x       Seated Hip Abduction  Sit upright in a chair with your back off the backrest.  Attach the band around your thighs and hold the band against your thigh.  Push your knees apart and let your feet turn out. Repeat 20x  [facility] [address] [place], [place] [address] Phone # [phone] Fax [phone]

## 2017-08-27 ENCOUNTER — Ambulatory Visit: Payer: PPO | Attending: Family Medicine | Admitting: Physical Therapy

## 2017-08-27 DIAGNOSIS — M6281 Muscle weakness (generalized): Secondary | ICD-10-CM | POA: Insufficient documentation

## 2017-08-27 DIAGNOSIS — R296 Repeated falls: Secondary | ICD-10-CM | POA: Diagnosis not present

## 2017-08-27 DIAGNOSIS — R262 Difficulty in walking, not elsewhere classified: Secondary | ICD-10-CM | POA: Diagnosis not present

## 2017-08-27 NOTE — Therapy (Signed)
[facility] [address], [place], [place], [address] Phone: [phone]   Fax:  [phone]  Physical Therapy Treatment  Patient Details  Name: Gregory [name] MRN: [id number] Date of Birth: 03/16/1933 Referring Provider: Orlie Dakin   Encounter Date: 08/27/2017  PT End of Session - 08/27/17 1330    Visit Number  2    Number of Visits  10    Date for PT Re-Evaluation  10/15/17    Authorization Type  medicare gcodes 10 visits, KX at 18    PT Start Time  1236    PT Stop Time  1318    PT Time Calculation (min)  42 min    Activity Tolerance  Patient tolerated treatment well    Behavior During Therapy  [facility] for tasks assessed/performed       Past Medical History:  Diagnosis Date  . Abnormality of gait 09/19/2016  . Allergy   . Cataract   . CHF (congestive heart failure) ([place])   . Colon polyps   . Diabetes mellitus   . Diabetic neuropathy ([place])   . Diverticulosis   . Dyslipidemia   . Essential hypertension   . Glaucoma   . Hyperlipidemia   . Hypothyroidism   . ICD (implantable cardiac defibrillator) in place 11-2004  . Internal hemorrhoid   . Ischemic cardiomyopathy   . Ischemic heart disease   . Myocardial infarction [facility]) 2006   LARGE ANTERIOR WALL    Past Surgical History:  Procedure Laterality Date  . CARDIAC DEFIBRILLATOR PLACEMENT    . CATARACT EXTRACTION     left  . CHOLECYSTECTOMY    . CHOLECYSTECTOMY, LAPAROSCOPIC    . CORONARY ARTERY BYPASS GRAFT  12/02/04  . 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 SHOULDER SURGERY     RIGHT  . TONSILLECTOMY      There were no vitals filed for this visit.  Subjective Assessment - 08/27/17 1240    Subjective  Pt reports things are going well. He has been doing all of his exercises except for his clams because he has just been too busy.     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  No/denies                      [facility] Adult PT Treatment/Exercise - 08/27/17 0001      Exercises   Exercises  Knee/Hip      Knee/Hip Exercises: Seated   Marching  Both;1 set;15 reps    Marching Weights  2 lbs.    Hamstring Curl  Both;1 set;15 reps    Hamstring Limitations  double red TB, cues to slow down reps      Knee/Hip Exercises: Supine   Bridges  2 sets;15 reps    Other Supine Knee/Hip Exercises  single leg clams with red TB x10 reps each     Other Supine Knee/Hip Exercises  ankle DF with double red TB x10 reps each           Balance Exercises - 08/27/17 1308      Balance Exercises: Standing   Standing Eyes Opened  Narrow base  of support (BOS);2 reps;30 secs    Standing Eyes Closed  2 reps;30 secs;Solid surface    Tandem Stance  2 reps;30 secs;Intermittent upper extremity support        PT Education - 08/27/17 1329    Education provided  Yes    Education Details  importance of completing HEP consistently; PT POC and goals to improve safety with daily activity; technique with therex     Person(s) Educated  Patient;Spouse    Methods  Explanation;Verbal cues;Tactile cues    Comprehension  Returned demonstration;Verbalized understanding       PT Short Term Goals - 08/20/17 1648      PT SHORT TERM GOAL #1   Title  pt will be ind with initial HEP    Time  4    Period  Weeks    Status  New    Target Date  09/17/17      PT SHORT TERM GOAL #2   Title  pt will be able to begin walking routine and be able to walk 6 minutes without taking a break    Time  4    Period  Weeks    Status  New    Target Date  09/17/17      PT SHORT TERM GOAL #3   Title  pt will perform sit to stand with minimal UE assistance </= to 32 seconds    Time  4    Period  Weeks    Status  New    Target  Date  09/17/17        PT Long Term Goals - 08/20/17 1554      PT LONG TERM GOAL #1   Title  pt will have increased endurance to be able to tolerate walking 30-45 minutes around the store.    Time  8    Period  Weeks    Status  New    Target Date  10/15/17      PT LONG TERM GOAL #2   Title  pt is able to walk up the stiars 50% more easily due to increased LE strength    Time  8    Period  Weeks    Status  New    Target Date  10/15/17      PT LONG TERM GOAL #3   Title  TUG </= to 25 sec for decreased risk of falls    Time  8    Period  Weeks    Status  New    Target Date  10/15/17      PT LONG TERM GOAL #4   Title  able to do sit to stand 5 x without using back of legs against the chair and min to no UE assistance    Time  8    Period  Weeks    Status  New    Target Date  10/15/17            Plan - 08/27/17 1331    Clinical Impression Statement  Pt arrived today with his wife, having completed a majority of his HEP provided at the evaluation 1 week ago. Session focused on therapist educating pt on the importance of HEP adherence, goals of therapy and benefits of improved strength with carry over into functional tasks/balance. Also completed therex to address areas of weakness found on his evaluation. The end of the session was focused on static balance activity, with the pt growing increasingly fatigued in the LEs  following the prior exercises. Because of this, he did require intermittent seated rest breaks. He would continue to benefit from skilled PT to address his limitations in strength and proprioception.     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; Rt shoulder surgery    PT Frequency  2x / week    PT Duration  8 weeks    PT Treatment/Interventions  ADLs/Self Care Home Management;Biofeedback;Cryotherapy;Electrical Stimulation;Moist Heat;Ultrasound;Gait training;Stair training;Functional mobility  training;Therapeutic activities;Therapeutic exercise;Balance training;Neuromuscular re-education;Patient/family education;Dry needling;Manual techniques;Scar mobilization;Passive range of motion;Taping    PT Next Visit Plan  nustep as able, focus on LE strength, static balance activity, stairs    Consulted and Agree with Plan of Care  Patient;Family member/caregiver    Family Member Consulted  wife        Patient will benefit from skilled therapeutic intervention in order to improve the following deficits and impairments:  Abnormal gait, Decreased balance, Decreased strength, Decreased endurance, Difficulty walking, Postural dysfunction  Visit Diagnosis: Difficulty in walking, not elsewhere classified  Muscle weakness (generalized)  Repeated falls     Problem List Patient Active Problem List   Diagnosis Date Noted  . Abnormality of gait 09/19/2016  . Unexplained night sweats 03/31/2015  . Constipation 05/18/2013  . Ischemic heart disease   . Myocardial infarction ([place])   . Hypothyroidism   . Diabetes mellitus   . Dyslipidemia   . Benign hypertensive heart disease without heart failure   . Ischemic cardiomyopathy   . Diabetic neuropathy ([place])   . HYPOTHYROIDISM 03/15/2009  . DYSLIPIDEMIA 03/15/2009  . 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 AUTOMATIC IMPLANTABLE CARDIAC DEFIBRILLATOR SITU 03/15/2009  . DM 02/02/2008  . DEPRESSION 02/02/2008  . MI 02/02/2008  . CAD 02/02/2008  . CHF 02/02/2008   1:37 PM,08/27/17 Elly Modena PT, DPT [facility] [address] [place], [place], [address] Phone: [phone]   Fax:  [phone]  Name: DEYVI BONANNO MRN: [id number] Date of Birth: Feb 22, 1933

## 2017-08-28 ENCOUNTER — Encounter: Payer: PPO | Admitting: Physical Therapy

## 2017-09-03 ENCOUNTER — Ambulatory Visit: Payer: PPO | Admitting: Physical Therapy

## 2017-09-06 ENCOUNTER — Ambulatory Visit: Payer: PPO | Admitting: Physical Therapy

## 2017-09-06 DIAGNOSIS — R262 Difficulty in walking, not elsewhere classified: Secondary | ICD-10-CM

## 2017-09-06 DIAGNOSIS — R296 Repeated falls: Secondary | ICD-10-CM

## 2017-09-06 DIAGNOSIS — M6281 Muscle weakness (generalized): Secondary | ICD-10-CM

## 2017-09-08 NOTE — Therapy (Signed)
[facility] [address], [place], [place], [address] Phone: [phone]   Fax:  [phone]  Physical Therapy Treatment  Patient Details  Name: Gregory [name] MRN: [id number] Date of Birth: August 21, 1933 Referring Provider: Orlie Dakin   Encounter Date: 09/06/2017  PT End of Session - 09/09/17 0749    Visit Number  3    Number of Visits  10    Date for PT Re-Evaluation  10/15/17    Authorization Type  medicare gcodes 10 visits, KX at 13    PT Start Time  1103    PT Stop Time  1145    PT Time Calculation (min)  42 min    Activity Tolerance  Patient tolerated treatment well;No increased pain    Behavior During Therapy  WFL for tasks assessed/performed       Past Medical History:  Diagnosis Date  . Abnormality of gait 09/19/2016  . Allergy   . Cataract   . CHF (congestive heart failure) ([place])   . Colon polyps   . Diabetes mellitus   . Diabetic neuropathy ([place])   . Diverticulosis   . Dyslipidemia   . Essential hypertension   . Glaucoma   . Hyperlipidemia   . Hypothyroidism   . ICD (implantable cardiac defibrillator) in place 11-2004  . Internal hemorrhoid   . Ischemic cardiomyopathy   . Ischemic heart disease   . Myocardial infarction [facility]) 2006   LARGE ANTERIOR WALL    Past Surgical History:  Procedure Laterality Date  . CARDIAC DEFIBRILLATOR PLACEMENT    . CATARACT EXTRACTION     left  . CHOLECYSTECTOMY    . CHOLECYSTECTOMY, LAPAROSCOPIC    . COLONOSCOPY N/A 12/01/2012   Performed by Irene Shipper, MD at [place]  . CORONARY ARTERY BYPASS GRAFT  12/02/04  . ESOPHAGOGASTRODUODENOSCOPY (EGD) N/A 12/01/2012   Performed by Irene Shipper, MD at [place]  . 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 ICD GENERATOR CHANGE N/A 03/11/2014   Performed by Evans Lance, MD at [facility] CATH LAB  . SHOULDER SURGERY     RIGHT  . TONSILLECTOMY      There were no vitals filed for  this visit.  Subjective Assessment - 09/09/17 0748    Subjective  Pt reports things are good. He is working on his exercises at home.     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  No/denies                      [facility] Adult PT Treatment/Exercise - 09/09/17 0001      Knee/Hip Exercises: Seated   Long Arc Quad  Both;2 sets;10 reps;Other (comment) 2# ankle weights     Ball Squeeze  x10 reps, 3 sec hold     Other Seated Knee/Hip Exercises  heel raises with 2# ankle weight x15 reps     Other Seated Knee/Hip Exercises  ankle DF x15 reps each     Marching  Both;1 set;15 reps    Marching Weights  2 lbs.    Hamstring Curl  2 sets;10 reps    Hamstring Limitations  double red  TB          Balance Exercises - 09/09/17 0748      Balance Exercises: Standing   Standing Eyes Closed  Narrow base of support (BOS);Solid surface;2 reps;30 secs    Tandem Stance  2 reps;30 secs;Eyes open;Upper extremity support 1 able to hold ~5 sec without UE support     Other Standing Exercises  Weight shifting Lt/Rt x10 reps, pt unable to coordinate movement despite heavy visual/verbal cues         PT Education - 09/08/17 2106    Education provided  Yes    Education Details  encouraged pt to use RW rather than SPC due to unsteadiness with activity and ambulation in the clinic; expectations of muscle soreness for 24-48 hours following sessions     Person(s) Educated  Patient;Spouse    Methods  Explanation    Comprehension  Verbalized understanding       PT Short Term Goals - 09/06/17 1119      PT SHORT TERM GOAL #1   Title  pt will be ind with initial HEP    Time  4    Period  Weeks    Status  Achieved      PT SHORT TERM GOAL #2   Title  pt will be able to begin walking routine and be  able to walk 6 minutes without taking a break    Time  4    Period  Weeks    Status  On-going      PT SHORT TERM GOAL #3   Title  pt will perform sit to stand with minimal UE assistance </= to 32 seconds    Time  4    Period  Weeks    Status  On-going        PT Long Term Goals - 08/20/17 1554      PT LONG TERM GOAL #1   Title  pt will have increased endurance to be able to tolerate walking 30-45 minutes around the store.    Time  8    Period  Weeks    Status  New    Target Date  10/15/17      PT LONG TERM GOAL #2   Title  pt is able to walk up the stiars 50% more easily due to increased LE strength    Time  8    Period  Weeks    Status  New    Target Date  10/15/17      PT LONG TERM GOAL #3   Title  TUG </= to 25 sec for decreased risk of falls    Time  8    Period  Weeks    Status  New    Target Date  10/15/17      PT LONG TERM GOAL #4   Title  able to do sit to stand 5 x without using back of legs against the chair and min to no UE assistance    Time  8    Period  Weeks    Status  New    Target Date  10/15/17            Plan - 09/08/17 2107    Clinical Impression Statement  Continued this session with LE strengthening with minor increases in repetitions/weight to prevent excessive soreness and fatigue following his session. Pt continues to demonstrate unsteadiness with standing activity such as tandem stance and will occasionally stumble to either side when  ambulating with his SPC. Due to pt's unsteadiness, therapist encouraged him to use his walker until his strength and balance is improved. Pt and his spouse both verbalized understanding of this and pt reported no pain or significant fatigue by the end of the session.     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; Rt shoulder surgery    PT Frequency  2x / week    PT Duration  8 weeks    PT Treatment/Interventions  ADLs/Self Care Home  Management;Biofeedback;Cryotherapy;Electrical Stimulation;Moist Heat;Ultrasound;Gait training;Stair training;Functional mobility training;Therapeutic activities;Therapeutic exercise;Balance training;Neuromuscular re-education;Patient/family education;Dry needling;Manual techniques;Scar mobilization;Passive range of motion;Taping    PT Next Visit Plan  f/u on pt using walker for ambulation; nustep as able, focus on LE strength, static balance activity, stairs    Consulted and Agree with Plan of Care  Patient;Family member/caregiver    Family Member Consulted  wife        Patient will benefit from skilled therapeutic intervention in order to improve the following deficits and impairments:  Abnormal gait, Decreased balance, Decreased strength, Decreased endurance, Difficulty walking, Postural dysfunction  Visit Diagnosis: Difficulty in walking, not elsewhere classified  Muscle weakness (generalized)  Repeated falls     Problem List Patient Active Problem List   Diagnosis Date Noted  . Abnormality of gait 09/19/2016  . Unexplained night sweats 03/31/2015  . Constipation 05/18/2013  . Ischemic heart disease   . Myocardial infarction ([place])   . Hypothyroidism   . Diabetes mellitus   . Dyslipidemia   . Benign hypertensive heart disease without heart failure   . Ischemic cardiomyopathy   . Diabetic neuropathy ([place])   . HYPOTHYROIDISM 03/15/2009  . DYSLIPIDEMIA 03/15/2009  . 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 AUTOMATIC IMPLANTABLE CARDIAC DEFIBRILLATOR SITU 03/15/2009  . DM 02/02/2008  . DEPRESSION 02/02/2008  . MI 02/02/2008  . CAD 02/02/2008  . CHF 02/02/2008   7:49 AM,09/09/17 Elly Modena PT, DPT [place] [facility] [place] [facility] [address], [place], [place], [address] Phone: [phone]   Fax:  [phone]  Name: Gregory [name] MRN: [id number] Date of Birth:  May 03, 1933

## 2017-09-09 ENCOUNTER — Other Ambulatory Visit: Payer: Self-pay

## 2017-09-09 MED ORDER — RAMIPRIL 1.25 MG PO CAPS: ORAL_CAPSULE | ORAL | 3 refills | Status: DC

## 2017-09-17 ENCOUNTER — Ambulatory Visit: Payer: PPO | Admitting: Physical Therapy

## 2017-09-17 DIAGNOSIS — M6281 Muscle weakness (generalized): Secondary | ICD-10-CM

## 2017-09-17 DIAGNOSIS — R262 Difficulty in walking, not elsewhere classified: Secondary | ICD-10-CM | POA: Diagnosis not present

## 2017-09-17 DIAGNOSIS — R296 Repeated falls: Secondary | ICD-10-CM

## 2017-09-17 NOTE — Therapy (Addendum)
[facility] [address] [place], [place], [address] Phone: [phone]   Fax:  [phone]  Physical Therapy Treatment/Discharge  Patient Details  Name: Gregory [name] MRN: [id number] Date of Birth: 16-Jun-1933 Referring Provider: Orlie Dakin   Encounter Date: 09/17/2017  PT End of Session - 09/17/17 1521    Visit Number  4    Number of Visits  10    Date for PT Re-Evaluation  10/15/17    Authorization Type  medicare gcodes 10 visits, KX at 45    PT Start Time  0175    PT Stop Time  1520    PT Time Calculation (min)  46 min    Activity Tolerance  Patient tolerated treatment well;No increased pain    Behavior During Therapy  WFL for tasks assessed/performed       Past Medical History:  Diagnosis Date  . Abnormality of gait 09/19/2016  . Allergy   . Cataract   . CHF (congestive heart failure) ([place])   . Colon polyps   . Diabetes mellitus   . Diabetic neuropathy ([place])   . Diverticulosis   . Dyslipidemia   . Essential hypertension   . Glaucoma   . Hyperlipidemia   . Hypothyroidism   . ICD (implantable cardiac defibrillator) in place 11-2004  . Internal hemorrhoid   . Ischemic cardiomyopathy   . Ischemic heart disease   . Myocardial infarction [facility]) 2006   LARGE ANTERIOR WALL    Past Surgical History:  Procedure Laterality Date  . CARDIAC DEFIBRILLATOR PLACEMENT    . CATARACT EXTRACTION     left  . CHOLECYSTECTOMY    . CHOLECYSTECTOMY, LAPAROSCOPIC    . COLONOSCOPY N/A 12/01/2012   Procedure: COLONOSCOPY;  Surgeon: Irene Shipper, MD;  Location: [facility];  Service: Endoscopy;  Laterality: N/A;  . CORONARY ARTERY BYPASS GRAFT  12/02/04  . ESOPHAGOGASTRODUODENOSCOPY N/A 12/01/2012   Procedure: ESOPHAGOGASTRODUODENOSCOPY (EGD);  Surgeon: Irene Shipper, MD;  Location: [facility];  Service: Endoscopy;  Laterality: N/A;  . 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 IMPLANTABLE  CARDIOVERTER DEFIBRILLATOR (ICD) GENERATOR CHANGE N/A 03/11/2014   Procedure: ICD GENERATOR CHANGE;  Surgeon: Evans Lance, MD;  Location: [facility] CATH LAB;  Service: Cardiovascular;  Laterality: N/A;  . SHOULDER SURGERY     RIGHT  . TONSILLECTOMY      There were no vitals filed for this visit.  Subjective Assessment - 09/17/17 1436    Subjective  Pt reports that things are going well. His wife states that he has not been using his walker much because there is not alot of room in the home. 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  No/denies                      [facility] Adult PT Treatment/Exercise - 09/17/17 0001      Knee/Hip Exercises: Aerobic   Nustep  L1 x5 min, pt requiring rest break at 2 min mark Therapist present to monitor pt SPM and response to activity      Knee/Hip Exercises: [place]  Quad  2 sets;10 reps;Both;Weights 3# ankle weight    Heel Slides  --    Other Seated Knee/Hip Exercises  ankle PF with double red TB 2x10 reps each           Balance Exercises - 09/17/17 1510      Balance Exercises: Standing   Standing Eyes Opened  Narrow base of support (BOS);2 reps;30 secs;Solid surface;Head turns;Limitations head turns x10 reps Lt/Rt; up/down; lateral Lt/Rt    Other Standing Exercises  weight shifting Lt/Rt with 2 UE support x30 sec, decreased to 2 finger support x2 min, CGA        PT Education - 09/17/17 1449    Education provided  Yes    Education Details  importance of using walker in the community rather than relying on his [facility] and hand held assistance; discussed risks of falling and causing injury to himself of his wife    Person(s) Educated  Patient;Spouse    Methods  Explanation    Comprehension   Verbalized understanding       PT Short Term Goals - 09/06/17 1119      PT SHORT TERM GOAL #1   Title  pt will be ind with initial HEP    Time  4    Period  Weeks    Status  Achieved      PT SHORT TERM GOAL #2   Title  pt will be able to begin walking routine and be able to walk 6 minutes without taking a break    Time  4    Period  Weeks    Status  On-going      PT SHORT TERM GOAL #3   Title  pt will perform sit to stand with minimal UE assistance </= to 32 seconds    Time  4    Period  Weeks    Status  On-going        PT Long Term Goals - 08/20/17 1554      PT LONG TERM GOAL #1   Title  pt will have increased endurance to be able to tolerate walking 30-45 minutes around the store.    Time  8    Period  Weeks    Status  New    Target Date  10/15/17      PT LONG TERM GOAL #2   Title  pt is able to walk up the stiars 50% more easily due to increased LE strength    Time  8    Period  Weeks    Status  New    Target Date  10/15/17      PT LONG TERM GOAL #3   Title  TUG </= to 25 sec for decreased risk of falls    Time  8    Period  Weeks    Status  New    Target Date  10/15/17      PT LONG TERM GOAL #4   Title  able to do sit to stand 5 x without using back of legs against the chair and min to no UE assistance    Time  8    Period  Weeks    Status  New    Target Date  10/15/17            Plan - 09/17/17 1521    Clinical Impression Statement  Pt arrived today without his walker, reporting minimal use at home since his last session. Therapist had lengthy  discussion with the pt and his spouse regarding the importance of utilizing his walker in the community as well as consistently completing his walking program daily at home. Pt and his spouse appeared to have good understanding of this. Otherwise, continued with therex to improve LE strength, noting pt was able to complete exercises with increased resistance this session. Also completed balance activity,  with noted improvements in his ability to complete lateral weight shifts compared to his previous sessions. Ended session without significant fatigue. Pt would continue to benefit from skilled PT to address limitations in strength, endurance, and proprioception.     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; Rt shoulder surgery    PT Frequency  2x / week    PT Duration  8 weeks    PT Treatment/Interventions  ADLs/Self Care Home Management;Biofeedback;Cryotherapy;Electrical Stimulation;Moist Heat;Ultrasound;Gait training;Stair training;Functional mobility training;Therapeutic activities;Therapeutic exercise;Balance training;Neuromuscular re-education;Patient/family education;Dry needling;Manual techniques;Scar mobilization;Passive range of motion;Taping    PT Next Visit Plan  f/u on pt using walker for ambulation; nustep as able (~5 min currently), focus on LE strength, static balance activity, stairs    PT Home Exercise Plan  replaced seated heel raises with PF red TB    Consulted and Agree with Plan of Care  Patient;Family member/caregiver    Family Member Consulted  wife        Patient will benefit from skilled therapeutic intervention in order to improve the following deficits and impairments:  Abnormal gait, Decreased balance, Decreased strength, Decreased endurance, Difficulty walking, Postural dysfunction  Visit Diagnosis: Difficulty in walking, not elsewhere classified  Muscle weakness (generalized)  Repeated falls     Problem List Patient Active Problem List   Diagnosis Date Noted  . Abnormality of gait 09/19/2016  . Unexplained night sweats 03/31/2015  . Constipation 05/18/2013  . Ischemic heart disease   . Myocardial infarction ([place])   . Hypothyroidism   . Diabetes mellitus   . Dyslipidemia   . Benign hypertensive heart disease without heart failure   . Ischemic cardiomyopathy   . Diabetic neuropathy  ([place])   . HYPOTHYROIDISM 03/15/2009  . DYSLIPIDEMIA 03/15/2009  . 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 AUTOMATIC IMPLANTABLE CARDIAC DEFIBRILLATOR SITU 03/15/2009  . DM 02/02/2008  . DEPRESSION 02/02/2008  . MI 02/02/2008  . CAD 02/02/2008  . CHF 02/02/2008   3:26 PM,09/17/17 Elly Modena PT, DPT [place] [facility] [place] [facility] [address], [place], [place], [address] Phone: [phone]   Fax:  [phone]  Name: RASHID WHITENIGHT MRN: [id number] Date of Birth: Jul 09, 1933   *Addendum to resolve episode of care and d/c pt from PT  [place]  Visits from Start of Care: 4  Current functional level related to goals / functional outcomes: See above for more details    Remaining deficits: See above for more details    Education / Equipment: See above for more details   Plan: Patient agrees to discharge.  Patient goals were partially met. Patient is being discharged due to not returning since the last visit.  ?????    4:30 PM,11/11/17 [place], [place] at [place]

## 2017-09-17 NOTE — Patient Instructions (Signed)
  Ankle Plantarflexion with Theraband  Begin in long sitting position with legs supported. Tie one end of the theraband around the foot and hold the other end in your hand. Press your foot forward as if you were pressing a pedal. Slowly allow the foot to return to starting position.   2x10 reps each side   [facility] [address] [place], [place] [address] Phone # [phone] Fax [phone]

## 2017-09-20 ENCOUNTER — Ambulatory Visit: Payer: PPO | Admitting: Physical Therapy

## 2017-09-25 ENCOUNTER — Encounter: Payer: PPO | Admitting: Physical Therapy

## 2017-10-01 ENCOUNTER — Encounter: Payer: PPO | Admitting: Physical Therapy

## 2017-10-18 ENCOUNTER — Telehealth: Payer: Self-pay | Admitting: Neurology

## 2017-10-18 DIAGNOSIS — I6522 Occlusion and stenosis of left carotid artery: Secondary | ICD-10-CM

## 2017-10-18 NOTE — Telephone Encounter (Signed)
I called the patient.  We will recheck a carotid Doppler study to follow-up on the prior study that showed stenosis of the left internal carotid artery.

## 2017-10-21 ENCOUNTER — Ambulatory Visit (INDEPENDENT_AMBULATORY_CARE_PROVIDER_SITE_OTHER): Payer: PPO | Admitting: *Deleted

## 2017-10-21 DIAGNOSIS — I255 Ischemic cardiomyopathy: Secondary | ICD-10-CM

## 2017-10-23 LAB — CUP PACEART REMOTE DEVICE CHECK
Battery Remaining Longevity: 109 mo
Battery Voltage: 3.01 V
Brady Statistic RV Percent Paced: 1.62 %
HighPow Impedance: 44 Ohm
Implantable Lead Location: 753860
Implantable Pulse Generator Implant Date: 20150521
Lead Channel Pacing Threshold Pulse Width: 0.4 ms
Lead Channel Setting Pacing Amplitude: 2.5 V
Lead Channel Setting Pacing Pulse Width: 0.4 ms
MDC IDC LEAD IMPLANT DT: 20090204
MDC IDC MSMT LEADCHNL RV IMPEDANCE VALUE: 304 Ohm
MDC IDC MSMT LEADCHNL RV IMPEDANCE VALUE: 342 Ohm
MDC IDC MSMT LEADCHNL RV PACING THRESHOLD AMPLITUDE: 1 V
MDC IDC MSMT LEADCHNL RV SENSING INTR AMPL: 7.5 mV
MDC IDC MSMT LEADCHNL RV SENSING INTR AMPL: 7.5 mV
MDC IDC SESS DTM: 20181231093824
MDC IDC SET LEADCHNL RV SENSING SENSITIVITY: 0.3 mV

## 2017-10-23 NOTE — Progress Notes (Signed)
Remote ICD transmission.   

## 2017-10-25 ENCOUNTER — Encounter: Payer: Self-pay | Admitting: Cardiology

## 2017-11-26 ENCOUNTER — Ambulatory Visit (HOSPITAL_COMMUNITY): Payer: PPO

## 2017-12-03 ENCOUNTER — Ambulatory Visit (HOSPITAL_COMMUNITY): Admission: RE | Admit: 2017-12-03 | Payer: PPO | Source: Ambulatory Visit

## 2017-12-06 DIAGNOSIS — H40012 Open angle with borderline findings, low risk, left eye: Secondary | ICD-10-CM | POA: Diagnosis not present

## 2017-12-06 DIAGNOSIS — H26492 Other secondary cataract, left eye: Secondary | ICD-10-CM | POA: Diagnosis not present

## 2017-12-06 DIAGNOSIS — H401123 Primary open-angle glaucoma, left eye, severe stage: Secondary | ICD-10-CM | POA: Diagnosis not present

## 2017-12-18 ENCOUNTER — Ambulatory Visit (HOSPITAL_COMMUNITY)
Admission: RE | Admit: 2017-12-18 | Discharge: 2017-12-18 | Disposition: A | Discharge status: Home / Self Care | Payer: PPO | Source: Ambulatory Visit | Attending: Neurology | Admitting: Neurology

## 2017-12-18 DIAGNOSIS — I6522 Occlusion and stenosis of left carotid artery: Secondary | ICD-10-CM | POA: Diagnosis not present

## 2017-12-19 NOTE — Progress Notes (Signed)
Bilateral carotid duplex completed. Right 40% to 59% ICA stenosis. Left - 1% to 39% ICA stenosis. Bilateral - Vertebral artery flow is antegrade. There appears to be no obvious change in results from previous exam however there are no images available for comparison. [facility], [place] 11/18/2017

## 2017-12-22 ENCOUNTER — Telehealth: Payer: Self-pay | Admitting: Neurology

## 2017-12-22 NOTE — Telephone Encounter (Signed)
  I called the patient.  The carotid Doppler study shows only 40-59% stenosis on the right internal carotid artery, left carotid is patent.  Vertebral artery flow is antegrade.  I discussed this with the patient.  Carotid doppler 12/20/17:  Final Interpretation: Right Carotid: Velocities in the right ICA are consistent with a 40-59%        stenosis. No obvious change in results since pevious exam. No        previous images available for comparison.  Left Carotid: Velocities in the left ICA are consistent with a 1-39% stenosis.       No obvious change in results since previous exam/ No previous       images available for comparison.  Vertebrals: Both vertebral arteries were patent with antegrade flow. Subclavians: Normal flow hemodynamics were seen in bilateral subclavian       arteries.

## 2018-01-17 ENCOUNTER — Other Ambulatory Visit: Payer: Self-pay | Admitting: Cardiovascular Disease

## 2018-01-20 ENCOUNTER — Ambulatory Visit (INDEPENDENT_AMBULATORY_CARE_PROVIDER_SITE_OTHER): Payer: PPO | Admitting: *Deleted

## 2018-01-20 DIAGNOSIS — I255 Ischemic cardiomyopathy: Secondary | ICD-10-CM | POA: Diagnosis not present

## 2018-01-20 NOTE — Progress Notes (Signed)
Remote ICD transmission.   

## 2018-01-21 ENCOUNTER — Encounter: Payer: Self-pay | Admitting: Cardiology

## 2018-01-24 LAB — CUP PACEART REMOTE DEVICE CHECK
Battery Remaining Longevity: 105 mo
Brady Statistic RV Percent Paced: 2.37 %
HIGH POWER IMPEDANCE MEASURED VALUE: 46 Ohm
Implantable Lead Implant Date: 20090204
Implantable Lead Model: 6935
Lead Channel Impedance Value: 304 Ohm
Lead Channel Impedance Value: 342 Ohm
Lead Channel Pacing Threshold Amplitude: 1 V
Lead Channel Sensing Intrinsic Amplitude: 8 mV
Lead Channel Setting Pacing Pulse Width: 0.4 ms
MDC IDC LEAD LOCATION: 753860
MDC IDC MSMT BATTERY VOLTAGE: 3.01 V
MDC IDC MSMT LEADCHNL RV PACING THRESHOLD PULSEWIDTH: 0.4 ms
MDC IDC MSMT LEADCHNL RV SENSING INTR AMPL: 8 mV
MDC IDC PG IMPLANT DT: 20150521
MDC IDC SESS DTM: 20190401041705
MDC IDC SET LEADCHNL RV PACING AMPLITUDE: 2.5 V
MDC IDC SET LEADCHNL RV SENSING SENSITIVITY: 0.3 mV

## 2018-01-29 DIAGNOSIS — E1122 Type 2 diabetes mellitus with diabetic chronic kidney disease: Secondary | ICD-10-CM | POA: Diagnosis not present

## 2018-01-29 DIAGNOSIS — I129 Hypertensive chronic kidney disease with stage 1 through stage 4 chronic kidney disease, or unspecified chronic kidney disease: Secondary | ICD-10-CM | POA: Diagnosis not present

## 2018-01-29 DIAGNOSIS — I252 Old myocardial infarction: Secondary | ICD-10-CM | POA: Diagnosis not present

## 2018-01-29 DIAGNOSIS — E7800 Pure hypercholesterolemia, unspecified: Secondary | ICD-10-CM | POA: Diagnosis not present

## 2018-01-29 DIAGNOSIS — Z0000 Encounter for general adult medical examination without abnormal findings: Secondary | ICD-10-CM | POA: Diagnosis not present

## 2018-01-29 DIAGNOSIS — E039 Hypothyroidism, unspecified: Secondary | ICD-10-CM | POA: Diagnosis not present

## 2018-01-29 DIAGNOSIS — N182 Chronic kidney disease, stage 2 (mild): Secondary | ICD-10-CM | POA: Diagnosis not present

## 2018-03-27 DIAGNOSIS — H5202 Hypermetropia, left eye: Secondary | ICD-10-CM | POA: Diagnosis not present

## 2018-03-27 DIAGNOSIS — H02055 Trichiasis without entropian left lower eyelid: Secondary | ICD-10-CM | POA: Diagnosis not present

## 2018-03-27 DIAGNOSIS — H52222 Regular astigmatism, left eye: Secondary | ICD-10-CM | POA: Diagnosis not present

## 2018-04-21 ENCOUNTER — Ambulatory Visit (INDEPENDENT_AMBULATORY_CARE_PROVIDER_SITE_OTHER): Payer: PPO | Admitting: *Deleted

## 2018-04-21 DIAGNOSIS — I255 Ischemic cardiomyopathy: Secondary | ICD-10-CM

## 2018-04-21 NOTE — Progress Notes (Signed)
Remote ICD transmission.   

## 2018-04-26 LAB — CUP PACEART REMOTE DEVICE CHECK
Brady Statistic RV Percent Paced: 0.2 %
HighPow Impedance: 53 Ohm
Implantable Lead Location: 753860
Implantable Lead Model: 6935
Lead Channel Impedance Value: 304 Ohm
Lead Channel Impedance Value: 342 Ohm
Lead Channel Pacing Threshold Amplitude: 0.875 V
Lead Channel Sensing Intrinsic Amplitude: 8 mV
Lead Channel Setting Pacing Pulse Width: 0.4 ms
Lead Channel Setting Sensing Sensitivity: 0.3 mV
MDC IDC LEAD IMPLANT DT: 20090204
MDC IDC MSMT BATTERY REMAINING LONGEVITY: 101 mo
MDC IDC MSMT BATTERY VOLTAGE: 3 V
MDC IDC MSMT LEADCHNL RV PACING THRESHOLD PULSEWIDTH: 0.4 ms
MDC IDC MSMT LEADCHNL RV SENSING INTR AMPL: 8 mV
MDC IDC PG IMPLANT DT: 20150521
MDC IDC SESS DTM: 20190701083724
MDC IDC SET LEADCHNL RV PACING AMPLITUDE: 2.5 V

## 2018-05-30 DIAGNOSIS — H02055 Trichiasis without entropian left lower eyelid: Secondary | ICD-10-CM | POA: Diagnosis not present

## 2018-05-30 DIAGNOSIS — Z7984 Long term (current) use of oral hypoglycemic drugs: Secondary | ICD-10-CM | POA: Diagnosis not present

## 2018-05-30 DIAGNOSIS — H52222 Regular astigmatism, left eye: Secondary | ICD-10-CM | POA: Diagnosis not present

## 2018-05-30 DIAGNOSIS — H353121 Nonexudative age-related macular degeneration, left eye, early dry stage: Secondary | ICD-10-CM | POA: Diagnosis not present

## 2018-05-30 DIAGNOSIS — E119 Type 2 diabetes mellitus without complications: Secondary | ICD-10-CM | POA: Diagnosis not present

## 2018-05-30 DIAGNOSIS — Z961 Presence of intraocular lens: Secondary | ICD-10-CM | POA: Diagnosis not present

## 2018-05-30 DIAGNOSIS — H5202 Hypermetropia, left eye: Secondary | ICD-10-CM | POA: Diagnosis not present

## 2018-05-30 DIAGNOSIS — H401123 Primary open-angle glaucoma, left eye, severe stage: Secondary | ICD-10-CM | POA: Diagnosis not present

## 2018-06-26 ENCOUNTER — Other Ambulatory Visit: Payer: Self-pay | Admitting: Cardiovascular Disease

## 2018-07-21 ENCOUNTER — Ambulatory Visit (INDEPENDENT_AMBULATORY_CARE_PROVIDER_SITE_OTHER): Payer: PPO | Admitting: *Deleted

## 2018-07-21 DIAGNOSIS — I255 Ischemic cardiomyopathy: Secondary | ICD-10-CM | POA: Diagnosis not present

## 2018-07-22 NOTE — Progress Notes (Signed)
Remote ICD transmission.   

## 2018-07-23 LAB — CUP PACEART REMOTE DEVICE CHECK
Battery Remaining Longevity: 101 mo
Battery Voltage: 3 V
HighPow Impedance: 55 Ohm
Implantable Lead Implant Date: 20090204
Implantable Lead Location: 753860
Implantable Pulse Generator Implant Date: 20150521
Lead Channel Pacing Threshold Amplitude: 0.875 V
Lead Channel Pacing Threshold Pulse Width: 0.4 ms
Lead Channel Sensing Intrinsic Amplitude: 9.75 mV
Lead Channel Setting Pacing Amplitude: 2.5 V
Lead Channel Setting Pacing Pulse Width: 0.4 ms
Lead Channel Setting Sensing Sensitivity: 0.3 mV
MDC IDC MSMT LEADCHNL RV IMPEDANCE VALUE: 323 Ohm
MDC IDC MSMT LEADCHNL RV IMPEDANCE VALUE: 380 Ohm
MDC IDC MSMT LEADCHNL RV SENSING INTR AMPL: 9.75 mV
MDC IDC SESS DTM: 20190930041703
MDC IDC STAT BRADY RV PERCENT PACED: 0.81 %

## 2018-07-30 ENCOUNTER — Encounter: Payer: PPO | Admitting: Internal Medicine

## 2018-07-30 DIAGNOSIS — I129 Hypertensive chronic kidney disease with stage 1 through stage 4 chronic kidney disease, or unspecified chronic kidney disease: Secondary | ICD-10-CM | POA: Diagnosis not present

## 2018-07-30 DIAGNOSIS — Z23 Encounter for immunization: Secondary | ICD-10-CM | POA: Diagnosis not present

## 2018-07-30 DIAGNOSIS — E1122 Type 2 diabetes mellitus with diabetic chronic kidney disease: Secondary | ICD-10-CM | POA: Diagnosis not present

## 2018-07-30 DIAGNOSIS — B0052 Herpesviral keratitis: Secondary | ICD-10-CM | POA: Diagnosis not present

## 2018-07-30 DIAGNOSIS — E785 Hyperlipidemia, unspecified: Secondary | ICD-10-CM | POA: Diagnosis not present

## 2018-07-30 DIAGNOSIS — E039 Hypothyroidism, unspecified: Secondary | ICD-10-CM | POA: Diagnosis not present

## 2018-07-30 DIAGNOSIS — N182 Chronic kidney disease, stage 2 (mild): Secondary | ICD-10-CM | POA: Diagnosis not present

## 2018-07-30 DIAGNOSIS — H5712 Ocular pain, left eye: Secondary | ICD-10-CM | POA: Diagnosis not present

## 2018-07-31 ENCOUNTER — Inpatient Hospital Stay (HOSPITAL_COMMUNITY)
Admission: EM | Admit: 2018-07-31 | Discharge: 2018-08-10 | DRG: 480 | Disposition: A | Discharge status: Skilled Nursing Facility | Payer: PPO | Attending: Internal Medicine | Admitting: Internal Medicine

## 2018-07-31 ENCOUNTER — Other Ambulatory Visit: Payer: Self-pay

## 2018-07-31 ENCOUNTER — Emergency Department (HOSPITAL_COMMUNITY): Payer: PPO

## 2018-07-31 ENCOUNTER — Encounter (HOSPITAL_COMMUNITY): Payer: Self-pay | Admitting: Emergency Medicine

## 2018-07-31 DIAGNOSIS — E1122 Type 2 diabetes mellitus with diabetic chronic kidney disease: Secondary | ICD-10-CM | POA: Diagnosis present

## 2018-07-31 DIAGNOSIS — Y92019 Unspecified place in single-family (private) house as the place of occurrence of the external cause: Secondary | ICD-10-CM | POA: Diagnosis not present

## 2018-07-31 DIAGNOSIS — S72001A Fracture of unspecified part of neck of right femur, initial encounter for closed fracture: Secondary | ICD-10-CM | POA: Diagnosis not present

## 2018-07-31 DIAGNOSIS — K5790 Diverticulosis of intestine, part unspecified, without perforation or abscess without bleeding: Secondary | ICD-10-CM | POA: Diagnosis present

## 2018-07-31 DIAGNOSIS — B0052 Herpesviral keratitis: Secondary | ICD-10-CM | POA: Diagnosis not present

## 2018-07-31 DIAGNOSIS — H409 Unspecified glaucoma: Secondary | ICD-10-CM | POA: Diagnosis present

## 2018-07-31 DIAGNOSIS — M2550 Pain in unspecified joint: Secondary | ICD-10-CM | POA: Diagnosis not present

## 2018-07-31 DIAGNOSIS — I255 Ischemic cardiomyopathy: Secondary | ICD-10-CM | POA: Diagnosis present

## 2018-07-31 DIAGNOSIS — E039 Hypothyroidism, unspecified: Secondary | ICD-10-CM | POA: Diagnosis present

## 2018-07-31 DIAGNOSIS — S72001D Fracture of unspecified part of neck of right femur, subsequent encounter for closed fracture with routine healing: Secondary | ICD-10-CM | POA: Diagnosis not present

## 2018-07-31 DIAGNOSIS — J9690 Respiratory failure, unspecified, unspecified whether with hypoxia or hypercapnia: Secondary | ICD-10-CM

## 2018-07-31 DIAGNOSIS — Y846 Urinary catheterization as the cause of abnormal reaction of the patient, or of later complication, without mention of misadventure at the time of the procedure: Secondary | ICD-10-CM | POA: Diagnosis not present

## 2018-07-31 DIAGNOSIS — J9600 Acute respiratory failure, unspecified whether with hypoxia or hypercapnia: Secondary | ICD-10-CM | POA: Diagnosis not present

## 2018-07-31 DIAGNOSIS — I252 Old myocardial infarction: Secondary | ICD-10-CM | POA: Diagnosis not present

## 2018-07-31 DIAGNOSIS — Z8719 Personal history of other diseases of the digestive system: Secondary | ICD-10-CM

## 2018-07-31 DIAGNOSIS — D539 Nutritional anemia, unspecified: Secondary | ICD-10-CM | POA: Diagnosis not present

## 2018-07-31 DIAGNOSIS — J9811 Atelectasis: Secondary | ICD-10-CM

## 2018-07-31 DIAGNOSIS — Z7984 Long term (current) use of oral hypoglycemic drugs: Secondary | ICD-10-CM

## 2018-07-31 DIAGNOSIS — R41841 Cognitive communication deficit: Secondary | ICD-10-CM | POA: Diagnosis not present

## 2018-07-31 DIAGNOSIS — Z419 Encounter for procedure for purposes other than remedying health state, unspecified: Secondary | ICD-10-CM

## 2018-07-31 DIAGNOSIS — Z951 Presence of aortocoronary bypass graft: Secondary | ICD-10-CM | POA: Diagnosis not present

## 2018-07-31 DIAGNOSIS — H5461 Unqualified visual loss, right eye, normal vision left eye: Secondary | ICD-10-CM | POA: Diagnosis present

## 2018-07-31 DIAGNOSIS — Z87891 Personal history of nicotine dependence: Secondary | ICD-10-CM

## 2018-07-31 DIAGNOSIS — T17990A Other foreign object in respiratory tract, part unspecified in causing asphyxiation, initial encounter: Secondary | ICD-10-CM | POA: Diagnosis not present

## 2018-07-31 DIAGNOSIS — D62 Acute posthemorrhagic anemia: Secondary | ICD-10-CM | POA: Diagnosis not present

## 2018-07-31 DIAGNOSIS — S72141D Displaced intertrochanteric fracture of right femur, subsequent encounter for closed fracture with routine healing: Secondary | ICD-10-CM | POA: Diagnosis not present

## 2018-07-31 DIAGNOSIS — N401 Enlarged prostate with lower urinary tract symptoms: Secondary | ICD-10-CM | POA: Diagnosis not present

## 2018-07-31 DIAGNOSIS — J690 Pneumonitis due to inhalation of food and vomit: Secondary | ICD-10-CM | POA: Diagnosis not present

## 2018-07-31 DIAGNOSIS — I5022 Chronic systolic (congestive) heart failure: Secondary | ICD-10-CM | POA: Diagnosis present

## 2018-07-31 DIAGNOSIS — S72144A Nondisplaced intertrochanteric fracture of right femur, initial encounter for closed fracture: Principal | ICD-10-CM | POA: Diagnosis present

## 2018-07-31 DIAGNOSIS — D72829 Elevated white blood cell count, unspecified: Secondary | ICD-10-CM | POA: Diagnosis not present

## 2018-07-31 DIAGNOSIS — E875 Hyperkalemia: Secondary | ICD-10-CM | POA: Diagnosis not present

## 2018-07-31 DIAGNOSIS — I130 Hypertensive heart and chronic kidney disease with heart failure and stage 1 through stage 4 chronic kidney disease, or unspecified chronic kidney disease: Secondary | ICD-10-CM | POA: Diagnosis present

## 2018-07-31 DIAGNOSIS — E119 Type 2 diabetes mellitus without complications: Secondary | ICD-10-CM | POA: Diagnosis not present

## 2018-07-31 DIAGNOSIS — Z9049 Acquired absence of other specified parts of digestive tract: Secondary | ICD-10-CM

## 2018-07-31 DIAGNOSIS — Z7982 Long term (current) use of aspirin: Secondary | ICD-10-CM

## 2018-07-31 DIAGNOSIS — T40605A Adverse effect of unspecified narcotics, initial encounter: Secondary | ICD-10-CM | POA: Diagnosis not present

## 2018-07-31 DIAGNOSIS — I453 Trifascicular block: Secondary | ICD-10-CM | POA: Diagnosis present

## 2018-07-31 DIAGNOSIS — R0902 Hypoxemia: Secondary | ICD-10-CM | POA: Diagnosis not present

## 2018-07-31 DIAGNOSIS — I4510 Unspecified right bundle-branch block: Secondary | ICD-10-CM | POA: Diagnosis present

## 2018-07-31 DIAGNOSIS — I472 Ventricular tachycardia: Secondary | ICD-10-CM | POA: Diagnosis not present

## 2018-07-31 DIAGNOSIS — N184 Chronic kidney disease, stage 4 (severe): Secondary | ICD-10-CM | POA: Diagnosis present

## 2018-07-31 DIAGNOSIS — R5381 Other malaise: Secondary | ICD-10-CM | POA: Diagnosis not present

## 2018-07-31 DIAGNOSIS — Z8042 Family history of malignant neoplasm of prostate: Secondary | ICD-10-CM

## 2018-07-31 DIAGNOSIS — I110 Hypertensive heart disease with heart failure: Secondary | ICD-10-CM | POA: Diagnosis not present

## 2018-07-31 DIAGNOSIS — T83518A Infection and inflammatory reaction due to other urinary catheter, initial encounter: Secondary | ICD-10-CM | POA: Diagnosis not present

## 2018-07-31 DIAGNOSIS — J90 Pleural effusion, not elsewhere classified: Secondary | ICD-10-CM | POA: Diagnosis not present

## 2018-07-31 DIAGNOSIS — Z79899 Other long term (current) drug therapy: Secondary | ICD-10-CM | POA: Diagnosis not present

## 2018-07-31 DIAGNOSIS — E785 Hyperlipidemia, unspecified: Secondary | ICD-10-CM | POA: Diagnosis present

## 2018-07-31 DIAGNOSIS — I119 Hypertensive heart disease without heart failure: Secondary | ICD-10-CM | POA: Diagnosis not present

## 2018-07-31 DIAGNOSIS — N390 Urinary tract infection, site not specified: Secondary | ICD-10-CM | POA: Diagnosis not present

## 2018-07-31 DIAGNOSIS — W07XXXA Fall from chair, initial encounter: Secondary | ICD-10-CM | POA: Diagnosis present

## 2018-07-31 DIAGNOSIS — I959 Hypotension, unspecified: Secondary | ICD-10-CM | POA: Diagnosis not present

## 2018-07-31 DIAGNOSIS — S72141A Displaced intertrochanteric fracture of right femur, initial encounter for closed fracture: Secondary | ICD-10-CM | POA: Diagnosis not present

## 2018-07-31 DIAGNOSIS — I509 Heart failure, unspecified: Secondary | ICD-10-CM | POA: Diagnosis not present

## 2018-07-31 DIAGNOSIS — J9601 Acute respiratory failure with hypoxia: Secondary | ICD-10-CM | POA: Diagnosis not present

## 2018-07-31 DIAGNOSIS — E1140 Type 2 diabetes mellitus with diabetic neuropathy, unspecified: Secondary | ICD-10-CM | POA: Diagnosis present

## 2018-07-31 DIAGNOSIS — Z7401 Bed confinement status: Secondary | ICD-10-CM | POA: Diagnosis not present

## 2018-07-31 DIAGNOSIS — Z9842 Cataract extraction status, left eye: Secondary | ICD-10-CM | POA: Diagnosis not present

## 2018-07-31 DIAGNOSIS — E440 Moderate protein-calorie malnutrition: Secondary | ICD-10-CM | POA: Diagnosis present

## 2018-07-31 DIAGNOSIS — W19XXXA Unspecified fall, initial encounter: Secondary | ICD-10-CM | POA: Diagnosis not present

## 2018-07-31 DIAGNOSIS — R338 Other retention of urine: Secondary | ICD-10-CM | POA: Diagnosis not present

## 2018-07-31 DIAGNOSIS — N139 Obstructive and reflux uropathy, unspecified: Secondary | ICD-10-CM | POA: Diagnosis not present

## 2018-07-31 DIAGNOSIS — J189 Pneumonia, unspecified organism: Secondary | ICD-10-CM | POA: Diagnosis not present

## 2018-07-31 DIAGNOSIS — Z79890 Hormone replacement therapy (postmenopausal): Secondary | ICD-10-CM

## 2018-07-31 DIAGNOSIS — E1159 Type 2 diabetes mellitus with other circulatory complications: Secondary | ICD-10-CM | POA: Diagnosis not present

## 2018-07-31 DIAGNOSIS — R278 Other lack of coordination: Secondary | ICD-10-CM | POA: Diagnosis not present

## 2018-07-31 DIAGNOSIS — B965 Pseudomonas (aeruginosa) (mallei) (pseudomallei) as the cause of diseases classified elsewhere: Secondary | ICD-10-CM | POA: Diagnosis not present

## 2018-07-31 DIAGNOSIS — M25551 Pain in right hip: Secondary | ICD-10-CM | POA: Diagnosis present

## 2018-07-31 DIAGNOSIS — I25810 Atherosclerosis of coronary artery bypass graft(s) without angina pectoris: Secondary | ICD-10-CM | POA: Diagnosis not present

## 2018-07-31 DIAGNOSIS — Z95810 Presence of automatic (implantable) cardiac defibrillator: Secondary | ICD-10-CM | POA: Diagnosis present

## 2018-07-31 DIAGNOSIS — H9190 Unspecified hearing loss, unspecified ear: Secondary | ICD-10-CM | POA: Diagnosis present

## 2018-07-31 DIAGNOSIS — T83511D Infection and inflammatory reaction due to indwelling urethral catheter, subsequent encounter: Secondary | ICD-10-CM | POA: Diagnosis not present

## 2018-07-31 DIAGNOSIS — I5042 Chronic combined systolic (congestive) and diastolic (congestive) heart failure: Secondary | ICD-10-CM | POA: Diagnosis not present

## 2018-07-31 DIAGNOSIS — Z6825 Body mass index (BMI) 25.0-25.9, adult: Secondary | ICD-10-CM

## 2018-07-31 DIAGNOSIS — E11621 Type 2 diabetes mellitus with foot ulcer: Secondary | ICD-10-CM | POA: Diagnosis not present

## 2018-07-31 DIAGNOSIS — R52 Pain, unspecified: Secondary | ICD-10-CM | POA: Diagnosis not present

## 2018-07-31 DIAGNOSIS — R2689 Other abnormalities of gait and mobility: Secondary | ICD-10-CM | POA: Diagnosis not present

## 2018-07-31 DIAGNOSIS — S7290XA Unspecified fracture of unspecified femur, initial encounter for closed fracture: Secondary | ICD-10-CM

## 2018-07-31 DIAGNOSIS — Z833 Family history of diabetes mellitus: Secondary | ICD-10-CM

## 2018-07-31 DIAGNOSIS — I2510 Atherosclerotic heart disease of native coronary artery without angina pectoris: Secondary | ICD-10-CM | POA: Diagnosis not present

## 2018-07-31 DIAGNOSIS — R918 Other nonspecific abnormal finding of lung field: Secondary | ICD-10-CM | POA: Diagnosis not present

## 2018-07-31 DIAGNOSIS — Z01818 Encounter for other preprocedural examination: Secondary | ICD-10-CM | POA: Diagnosis not present

## 2018-07-31 DIAGNOSIS — R410 Disorientation, unspecified: Secondary | ICD-10-CM | POA: Diagnosis not present

## 2018-07-31 MED ORDER — FENTANYL CITRATE (PF) 100 MCG/2ML IJ SOLN
50.0000 ug | Freq: Once | INTRAMUSCULAR | Status: AC
Start: 2018-07-31 — End: 2018-07-31
  Administered 2018-07-31: 50 ug via INTRAVENOUS
  Filled 2018-07-31: qty 2

## 2018-07-31 NOTE — ED Triage Notes (Signed)
EMS reports the pt had a mechanical fall, tripped over his own 2 feet. EMS denies LOC. EMS reports shortening and rotation of the right leg; PMS intact. EMS reports no bloodthinners. EMS started IV as noted, 12 lead unremarkable; pt in a paced rhythm. EMS reports administration of fentanyl and then respirations decreased, thus his oxygen saturation dropped as well so EMS placed pt on 2 liters of oxygen by cannula.   Pt states he fell over his feet and did not have any LOC. Pt reports pain to the right hip area.

## 2018-07-31 NOTE — ED Provider Notes (Signed)
[facility] EMERGENCY DEPARTMENT Provider Note   CSN: [id number] Arrival date & time: 07/31/18  2140     History   Chief Complaint Chief Complaint  Patient presents with  . Fall    HPI Gregory [name] is a 84 y.o. male.   84 year old male with a history of diabetes, HLD, HTN, ACS/MI, ICM s/p ICD placement in 2006, sCHF (LVEF 25-30% in 2015) presents to the ED following a fall at home. Is with his sisters and niece at bedside.  States that he was ambulating when he tripped over his feet and fell on his right side.  Is complaining of pain to his right hip.  This is improved with rest and aggravated by movement.  Received fentanyl by EMS prior to arrival with some improvement in pain.  Not on chronic anticoagulants; takes 324mg  ASA daily.     Past Medical History:  Diagnosis Date  . Abnormality of gait 09/19/2016  . Allergy   . Cataract   . CHF (congestive heart failure) ([place])   . Colon polyps   . Diabetes mellitus   . Diabetic neuropathy ([place])   . Diverticulosis   . Dyslipidemia   . Essential hypertension   . Glaucoma   . Hyperlipidemia   . Hypothyroidism   . ICD (implantable cardiac defibrillator) in place 11-2004  . Internal hemorrhoid   . Ischemic cardiomyopathy   . Ischemic heart disease   . Myocardial infarction [facility]) 2006   LARGE ANTERIOR WALL    Patient Active Problem List   Diagnosis Date Noted  . Abnormality of gait 09/19/2016  . Unexplained night sweats 03/31/2015  . Constipation 05/18/2013  . Ischemic heart disease   . Myocardial infarction ([place])   . Hypothyroidism   . Diabetes mellitus   . Dyslipidemia   . Benign hypertensive heart disease without heart failure   . Ischemic cardiomyopathy   . Diabetic neuropathy ([place])   . HYPOTHYROIDISM 03/15/2009  . DYSLIPIDEMIA 03/15/2009  . 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 AUTOMATIC IMPLANTABLE CARDIAC DEFIBRILLATOR SITU 03/15/2009  . DM 02/02/2008  . DEPRESSION 02/02/2008  .  MI 02/02/2008  . CAD 02/02/2008  . CHF 02/02/2008    Past Surgical History:  Procedure Laterality Date  . CARDIAC DEFIBRILLATOR PLACEMENT    . CATARACT EXTRACTION     left  . CHOLECYSTECTOMY    . CHOLECYSTECTOMY, LAPAROSCOPIC    . COLONOSCOPY N/A 12/01/2012   Procedure: COLONOSCOPY;  Surgeon: Irene Shipper, MD;  Location: [facility] ENDOSCOPY;  Service: Endoscopy;  Laterality: N/A;  . CORONARY ARTERY BYPASS GRAFT  12/02/04  . ESOPHAGOGASTRODUODENOSCOPY N/A 12/01/2012   Procedure: ESOPHAGOGASTRODUODENOSCOPY (EGD);  Surgeon: Irene Shipper, MD;  Location: [facility];  Service: Endoscopy;  Laterality: N/A;  . 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 IMPLANTABLE CARDIOVERTER DEFIBRILLATOR (ICD) GENERATOR CHANGE N/A 03/11/2014   Procedure: ICD GENERATOR CHANGE;  Surgeon: Evans Lance, MD;  Location: [facility] CATH LAB;  Service: Cardiovascular;  Laterality: N/A;  . SHOULDER SURGERY     RIGHT  . TONSILLECTOMY          Home Medications    Prior to Admission medications   Medication Sig Start Date End Date Taking? Authorizing Provider  acetaminophen (TYLENOL) 500 MG tablet Take 500 mg by mouth every 6 (six) hours as needed. For pain    [provider]  amiodarone (PACERONE) 200 MG tablet TAKE 1/2 TABLET BY MOUTH EVERY MORNING 06/26/18   Jenkins Rouge  C, MD  aspirin EC 325 MG tablet Take 325 mg by mouth every morning.    [provider]  atorvastatin (LIPITOR) 20 MG tablet Take 1 tablet (20 mg total) by mouth daily. 07/23/16   Josue Hector, MD  calcium carbonate (TUMS EX) 750 MG chewable tablet Chew 1 tablet by mouth daily.     [provider]  carvedilol (COREG) 12.5 MG tablet TAKE 1 TABLET (12.5 MG TOTAL) BY MOUTH 2 (TWO) TIMES DAILY WITH A MEAL. 01/17/18   Josue Hector, MD  cetirizine (ZYRTEC) 10 MG tablet Take 10 mg by mouth daily.    [provider]  cholecalciferol (VITAMIN D) 1000 UNITS tablet Take 1,000 Units by mouth daily.    [provider]  diphenhydrAMINE (BENADRYL) 25 mg capsule Take 25 mg by mouth daily as needed for allergies.     [provider]  dorzolamide-timolol (COSOPT) 22.3-6.8 MG/ML ophthalmic solution Place 1 drop into the left eye daily.    [provider]  ferrous sulfate 325 (65 FE) MG tablet Take 325 mg by mouth daily with breakfast.    [provider]  furosemide (LASIX) 40 MG tablet Take 1 tablet (40 mg total) by mouth daily. 11/30/14   Darlin Coco, MD  gabapentin (NEURONTIN) 300 MG capsule Take 300 mg by mouth 3 (three) times daily.     [provider]  Inulin (METAMUCIL CLEAR & NATURAL) POWD Take 15 mLs by mouth daily as needed (fiber).  05/18/13   Darlin Coco, MD  latanoprost (XALATAN) 0.005 % ophthalmic solution Place 1 drop into the left eye at bedtime.    [provider]  metFORMIN (GLUCOPHAGE) 500 MG tablet Take 500 mg by mouth 2 (two) times daily.  09/07/12   [provider]  Multiple Vitamins-Minerals (CENTRUM SILVER PO) Take 1 tablet by mouth daily.     [provider]  nitroGLYCERIN (NITROSTAT) 0.4 MG SL tablet Place 1 tablet (0.4 mg total) under the tongue every 5 (five) minutes as needed for chest pain. 05/04/16   Evans Lance, MD  promethazine (PHENERGAN) 25 MG tablet Take 25 mg by mouth every 4 (four) hours as needed. nausea 10/26/15   [provider]  ramipril (ALTACE) 1.25 MG capsule TAKE 1 CAPSULE BY MOUTH EVERY DAY Patient taking differently: Take 1.25 mg by mouth daily.  09/09/17   Josue Hector, MD  SYNTHROID 137 MCG tablet Take 137 mcg by mouth daily. 10/15/15   [provider]  tamsulosin (FLOMAX) 0.4 MG CAPS capsule Take 0.4 mg by mouth daily after breakfast.  02/02/14   [provider]  temazepam (RESTORIL) 30 MG capsule Take 30 mg by mouth at bedtime as needed for sleep.     [provider]    Family History Family History  Problem Relation Age of Onset  .  Diabetes Mother   . Prostate cancer Father   . Cancer Daughter        ? unknown, in stomach  . Diabetes Unknown   . Colon cancer Neg Hx   . Esophageal cancer Neg Hx   . Rectal cancer Neg Hx   . Stomach cancer Neg Hx     Social History Social History   Tobacco Use  . Smoking status: Former Smoker    Types: Cigarettes    Last attempt to quit: 10/22/2004    Years since quitting: 13.7  . Smokeless tobacco: Never Used  . Tobacco comment: quit smoking pipe/cigar in 2006  Substance Use Topics  . Alcohol use: No    Alcohol/week: 0.0 standard drinks  . Drug use: No     Allergies   Patient has no known allergies.   Review of Systems Review of Systems Ten systems reviewed and are negative for acute change, except as noted in the HPI.    Physical Exam Updated Vital Signs BP (!) 130/55   Pulse (!) 56   Temp 97.7 F (36.5 C) (Oral)   Resp 17   Ht 5\' 6"  (1.676 m)   Wt 70.8 kg   SpO2 100%   BMI 25.18 kg/m   Physical Exam  Constitutional: He is oriented to person, place, and time. He appears well-developed and well-nourished. No distress.  HENT:  Head: Normocephalic and atraumatic.  Eyes: Conjunctivae are normal. No scleral icterus.  Patch over R eye.  Neck: Normal range of motion.  Cardiovascular: Normal rate, regular rhythm and intact distal pulses.  Bounding R femoral pulse. DP pulse 1+ in the RLE. No palpable posterior tibial pulse.  Pulmonary/Chest: Effort normal. No respiratory distress.  Respirations even and unlabored  Musculoskeletal: He exhibits deformity (mild RLE shortening).  Slight shortening of the right lower extremity with mild external rotation.  There is tenderness to the right hip without crepitus.  Neurological: He is alert and oriented to person, place, and time. He exhibits normal muscle tone. Coordination normal.  GCS 15.  Answers questions appropriately.  Sensation to light touch intact in the right lower extremity.  Skin: Skin is warm and dry.  No rash noted. He is not diaphoretic. No erythema. No pallor.  Psychiatric: He has a normal mood and affect. His behavior is normal.  Nursing note and vitals reviewed.    ED Treatments / Results  Labs (all labs ordered are listed, but only abnormal results are displayed) Labs Reviewed  CBC WITH DIFFERENTIAL/PLATELET - Abnormal; Notable for the following components:      Result Value   WBC 10.7 (*)    RBC 3.30 (*)    Hemoglobin 10.8 (*)    HCT 33.6 (*)    MCV 101.8 (*)    Abs Immature Granulocytes 0.08 (*)    All other components within normal limits  BASIC METABOLIC PANEL - Abnormal; Notable for the following components:   Glucose, Bld 155 (*)    Creatinine, Ser 1.70 (*)    Calcium 8.8 (*)    GFR calc non Af Amer 36 (*)    GFR calc Af Amer 41 (*)    All other components within normal limits  PROTIME-INR  TYPE AND SCREEN  ABO/RH    EKG EKG Interpretation  Date/Time:  Thursday July 31 2018 23:34:14 EDT Ventricular Rate:  63 PR Interval:    QRS Duration: 179 QT Interval:  498 QTC Calculation: 510 R Axis:   -71 Text Interpretation:  indeterminate rhythm Right bundle branch block Confirmed by Ripley Fraise ([id number]) on 07/31/2018 11:39:37 PM   Radiology Dg Chest Port 1 View  Result Date: 08/01/2018 CLINICAL DATA:  Preoperative for hip fracture. EXAM: PORTABLE CHEST 1 VIEW COMPARISON:  03/31/2015 FINDINGS: Postoperative changes in the mediastinum. Cardiac pacemaker. Shallow inspiration. Cardiac enlargement without vascular congestion. No edema or consolidation. There is chronic blunting of the left costophrenic angle probably due to pleural thickening. No pneumothorax. Calcification of the aorta. IMPRESSION: Cardiac enlargement. No evidence of active pulmonary disease. Chronic blunting of the left costophrenic angle probably due to pleural thickening. Electronically Signed   By: Oren Beckmann.D.  On: 08/01/2018 00:20   Dg Hip Unilat  With Pelvis 2-3 Views  Right  Result Date: 07/31/2018 CLINICAL DATA:  Fall with right hip pain EXAM: DG HIP (WITH OR WITHOUT PELVIS) 2-3V RIGHT COMPARISON:  None. FINDINGS: Nondisplaced intertrochanteric fracture of the proximal right femur. Femoroacetabular joint is approximated. No pelvic fracture or diastasis. Normal left hip. Extensive vascular calcification. IMPRESSION: Nondisplaced intertrochanteric fracture of the right femur. Electronically Signed   By: Ulyses Jarred M.D.   On: 07/31/2018 23:02    Procedures Procedures (including critical care time)  Medications Ordered in ED Medications  fentaNYL (SUBLIMAZE) injection 50 mcg (50 mcg Intravenous Given 07/31/18 2349)     11:13 PM Xray with positive nondisplaced intertrochanteric fx of the right femur. Will discuss with Orthopedics on call. Anticipate [facility] admission.  11:18 PM Case discussed with Dr. Griffin Basil of Orthopedics. He will consult on the patient in the morning. Will proceed with admission to [facility] once labs resulted.  1:05 AM Labs resulted. Hospitalist paged.   Initial Impression / Assessment and Plan / ED Course  I have reviewed the triage vital signs and the nursing notes.  Pertinent labs & imaging results that were available during my care of the patient were reviewed by me and considered in my medical decision making (see chart for details).      84 year old male presenting for right hip pain after mechanical fall.  Neurovascularly intact and found to have nondisplaced right intertrochanteric femoral fracture.  Will admit to [facility] with plan for orthopedic consultation in the morning.  Likely operative repair while admitted.   Final Clinical Impressions(s) / ED Diagnoses   Final diagnoses:  Closed nondisplaced intertrochanteric fracture of right femur, initial encounter [facility])    ED Discharge Orders    None       Antonietta Breach, PA-C 08/01/18 0111    Ripley Fraise, MD 08/01/18 [phone]

## 2018-07-31 NOTE — ED Provider Notes (Signed)
Patient seen/examined in the Emergency Department in conjunction with Midlevel Provider [facility] Patient reports mechanical fall, now having pain in his right leg Exam : Awake alert, right leg is shortened and externally rotated, distal pulses intact Plan: Patient with hip fracture, will need to be admitted   Ripley Fraise, MD 07/31/18 2332

## 2018-08-01 ENCOUNTER — Encounter (HOSPITAL_COMMUNITY): Payer: Self-pay | Admitting: Internal Medicine

## 2018-08-01 ENCOUNTER — Encounter (HOSPITAL_COMMUNITY)
Admission: EM | Disposition: A | Discharge status: Skilled Nursing Facility | Payer: Self-pay | Source: Home / Self Care | Attending: Internal Medicine

## 2018-08-01 ENCOUNTER — Encounter: Payer: PPO | Admitting: Internal Medicine

## 2018-08-01 ENCOUNTER — Inpatient Hospital Stay (HOSPITAL_COMMUNITY): Payer: PPO | Admitting: Certified Registered Nurse Anesthetist

## 2018-08-01 ENCOUNTER — Inpatient Hospital Stay (HOSPITAL_COMMUNITY): Payer: PPO

## 2018-08-01 DIAGNOSIS — Y92019 Unspecified place in single-family (private) house as the place of occurrence of the external cause: Secondary | ICD-10-CM | POA: Diagnosis not present

## 2018-08-01 DIAGNOSIS — T83518A Infection and inflammatory reaction due to other urinary catheter, initial encounter: Secondary | ICD-10-CM | POA: Diagnosis not present

## 2018-08-01 DIAGNOSIS — I252 Old myocardial infarction: Secondary | ICD-10-CM | POA: Diagnosis not present

## 2018-08-01 DIAGNOSIS — E440 Moderate protein-calorie malnutrition: Secondary | ICD-10-CM | POA: Diagnosis present

## 2018-08-01 DIAGNOSIS — T83511D Infection and inflammatory reaction due to indwelling urethral catheter, subsequent encounter: Secondary | ICD-10-CM | POA: Diagnosis not present

## 2018-08-01 DIAGNOSIS — E1159 Type 2 diabetes mellitus with other circulatory complications: Secondary | ICD-10-CM | POA: Diagnosis not present

## 2018-08-01 DIAGNOSIS — Z01818 Encounter for other preprocedural examination: Secondary | ICD-10-CM

## 2018-08-01 DIAGNOSIS — E1122 Type 2 diabetes mellitus with diabetic chronic kidney disease: Secondary | ICD-10-CM | POA: Diagnosis present

## 2018-08-01 DIAGNOSIS — E039 Hypothyroidism, unspecified: Secondary | ICD-10-CM | POA: Diagnosis present

## 2018-08-01 DIAGNOSIS — I5042 Chronic combined systolic (congestive) and diastolic (congestive) heart failure: Secondary | ICD-10-CM

## 2018-08-01 DIAGNOSIS — E1140 Type 2 diabetes mellitus with diabetic neuropathy, unspecified: Secondary | ICD-10-CM | POA: Diagnosis present

## 2018-08-01 DIAGNOSIS — I453 Trifascicular block: Secondary | ICD-10-CM

## 2018-08-01 DIAGNOSIS — N184 Chronic kidney disease, stage 4 (severe): Secondary | ICD-10-CM | POA: Diagnosis present

## 2018-08-01 DIAGNOSIS — Y846 Urinary catheterization as the cause of abnormal reaction of the patient, or of later complication, without mention of misadventure at the time of the procedure: Secondary | ICD-10-CM | POA: Diagnosis not present

## 2018-08-01 DIAGNOSIS — J690 Pneumonitis due to inhalation of food and vomit: Secondary | ICD-10-CM | POA: Diagnosis not present

## 2018-08-01 DIAGNOSIS — I25810 Atherosclerosis of coronary artery bypass graft(s) without angina pectoris: Secondary | ICD-10-CM

## 2018-08-01 DIAGNOSIS — Z951 Presence of aortocoronary bypass graft: Secondary | ICD-10-CM | POA: Diagnosis not present

## 2018-08-01 DIAGNOSIS — I472 Ventricular tachycardia: Secondary | ICD-10-CM

## 2018-08-01 DIAGNOSIS — J9601 Acute respiratory failure with hypoxia: Secondary | ICD-10-CM | POA: Diagnosis not present

## 2018-08-01 DIAGNOSIS — R5381 Other malaise: Secondary | ICD-10-CM | POA: Diagnosis not present

## 2018-08-01 DIAGNOSIS — I5022 Chronic systolic (congestive) heart failure: Secondary | ICD-10-CM | POA: Diagnosis present

## 2018-08-01 DIAGNOSIS — Z79899 Other long term (current) drug therapy: Secondary | ICD-10-CM

## 2018-08-01 DIAGNOSIS — M25551 Pain in right hip: Secondary | ICD-10-CM | POA: Diagnosis present

## 2018-08-01 DIAGNOSIS — I130 Hypertensive heart and chronic kidney disease with heart failure and stage 1 through stage 4 chronic kidney disease, or unspecified chronic kidney disease: Secondary | ICD-10-CM | POA: Diagnosis present

## 2018-08-01 DIAGNOSIS — I255 Ischemic cardiomyopathy: Secondary | ICD-10-CM | POA: Diagnosis present

## 2018-08-01 DIAGNOSIS — Z95810 Presence of automatic (implantable) cardiac defibrillator: Secondary | ICD-10-CM

## 2018-08-01 DIAGNOSIS — D62 Acute posthemorrhagic anemia: Secondary | ICD-10-CM | POA: Diagnosis not present

## 2018-08-01 DIAGNOSIS — S72001A Fracture of unspecified part of neck of right femur, initial encounter for closed fracture: Secondary | ICD-10-CM

## 2018-08-01 DIAGNOSIS — R918 Other nonspecific abnormal finding of lung field: Secondary | ICD-10-CM | POA: Diagnosis not present

## 2018-08-01 DIAGNOSIS — J189 Pneumonia, unspecified organism: Secondary | ICD-10-CM | POA: Diagnosis not present

## 2018-08-01 DIAGNOSIS — N390 Urinary tract infection, site not specified: Secondary | ICD-10-CM | POA: Diagnosis not present

## 2018-08-01 DIAGNOSIS — I4510 Unspecified right bundle-branch block: Secondary | ICD-10-CM | POA: Diagnosis present

## 2018-08-01 DIAGNOSIS — S72144A Nondisplaced intertrochanteric fracture of right femur, initial encounter for closed fracture: Secondary | ICD-10-CM | POA: Diagnosis present

## 2018-08-01 DIAGNOSIS — E785 Hyperlipidemia, unspecified: Secondary | ICD-10-CM | POA: Diagnosis present

## 2018-08-01 DIAGNOSIS — W07XXXA Fall from chair, initial encounter: Secondary | ICD-10-CM | POA: Diagnosis present

## 2018-08-01 DIAGNOSIS — J9811 Atelectasis: Secondary | ICD-10-CM | POA: Diagnosis not present

## 2018-08-01 DIAGNOSIS — R0902 Hypoxemia: Secondary | ICD-10-CM | POA: Diagnosis not present

## 2018-08-01 DIAGNOSIS — Z7984 Long term (current) use of oral hypoglycemic drugs: Secondary | ICD-10-CM | POA: Diagnosis not present

## 2018-08-01 DIAGNOSIS — Z9049 Acquired absence of other specified parts of digestive tract: Secondary | ICD-10-CM | POA: Diagnosis not present

## 2018-08-01 DIAGNOSIS — H409 Unspecified glaucoma: Secondary | ICD-10-CM | POA: Diagnosis present

## 2018-08-01 DIAGNOSIS — Z9842 Cataract extraction status, left eye: Secondary | ICD-10-CM | POA: Diagnosis not present

## 2018-08-01 DIAGNOSIS — S72141A Displaced intertrochanteric fracture of right femur, initial encounter for closed fracture: Secondary | ICD-10-CM | POA: Diagnosis present

## 2018-08-01 HISTORY — PX: INTRAMEDULLARY (IM) NAIL INTERTROCHANTERIC: SHX5875

## 2018-08-01 LAB — BASIC METABOLIC PANEL
ANION GAP: 9 (ref 5–15)
Anion gap: 8 (ref 5–15)
BUN: 23 mg/dL (ref 8–23)
BUN: 23 mg/dL (ref 8–23)
CO2: 28 mmol/L (ref 22–32)
CO2: 29 mmol/L (ref 22–32)
CREATININE: 1.7 mg/dL — AB (ref 0.61–1.24)
Calcium: 8.8 mg/dL — ABNORMAL LOW (ref 8.9–10.3)
Calcium: 8.7 mg/dL — ABNORMAL LOW (ref 8.9–10.3)
Chloride: 100 mmol/L (ref 98–111)
Chloride: 100 mmol/L (ref 98–111)
Creatinine, Ser: 1.71 mg/dL — ABNORMAL HIGH (ref 0.61–1.24)
GFR calc Af Amer: 41 mL/min — ABNORMAL LOW (ref >60)
GFR calc non Af Amer: 36 mL/min — ABNORMAL LOW (ref >60)
GFR, EST AFRICAN AMERICAN: 41 mL/min — AB (ref >60)
GFR, EST NON AFRICAN AMERICAN: 36 mL/min — AB (ref >60)
GLUCOSE: 155 mg/dL — AB (ref 70–99)
GLUCOSE: 163 mg/dL — AB (ref 70–99)
POTASSIUM: 5.3 mmol/L — AB (ref 3.5–5.1)
Potassium: 4.8 mmol/L (ref 3.5–5.1)
Sodium: 136 mmol/L (ref 135–145)
Sodium: 138 mmol/L (ref 135–145)

## 2018-08-01 LAB — CBC WITH DIFFERENTIAL/PLATELET
Abs Immature Granulocytes: 0.08 10*3/uL — ABNORMAL HIGH (ref 0.00–0.07)
BASOS ABS: 0.1 10*3/uL (ref 0.0–0.1)
Basophils Relative: 1 %
EOS PCT: 5 %
Eosinophils Absolute: 0.5 10*3/uL (ref 0.0–0.5)
HEMATOCRIT: 33.6 % — AB (ref 39.0–52.0)
Hemoglobin: 10.8 g/dL — ABNORMAL LOW (ref 13.0–17.0)
IMMATURE GRANULOCYTES: 1 %
LYMPHS ABS: 2.3 10*3/uL (ref 0.7–4.0)
Lymphocytes Relative: 22 %
MCH: 32.7 pg (ref 26.0–34.0)
MCHC: 32.1 g/dL (ref 30.0–36.0)
MCV: 101.8 fL — ABNORMAL HIGH (ref 80.0–100.0)
Monocytes Absolute: 1 10*3/uL (ref 0.1–1.0)
Monocytes Relative: 9 %
NEUTROS PCT: 62 %
NRBC: 0 % (ref 0.0–0.2)
Neutro Abs: 6.8 10*3/uL (ref 1.7–7.7)
PLATELETS: 175 10*3/uL (ref 150–400)
RBC: 3.3 MIL/uL — ABNORMAL LOW (ref 4.22–5.81)
RDW: 13.4 % (ref 11.5–15.5)
WBC: 10.7 10*3/uL — ABNORMAL HIGH (ref 4.0–10.5)

## 2018-08-01 LAB — SURGICAL PCR SCREEN
MRSA, PCR: NEGATIVE
STAPHYLOCOCCUS AUREUS: NEGATIVE

## 2018-08-01 LAB — CBC
HEMATOCRIT: 31.1 % — AB (ref 39.0–52.0)
HEMOGLOBIN: 9.9 g/dL — AB (ref 13.0–17.0)
MCH: 31.9 pg (ref 26.0–34.0)
MCHC: 31.8 g/dL (ref 30.0–36.0)
MCV: 100.3 fL — AB (ref 80.0–100.0)
NRBC: 0 % (ref 0.0–0.2)
Platelets: 167 10*3/uL (ref 150–400)
RBC: 3.1 MIL/uL — AB (ref 4.22–5.81)
RDW: 13.5 % (ref 11.5–15.5)
WBC: 11.3 10*3/uL — ABNORMAL HIGH (ref 4.0–10.5)

## 2018-08-01 LAB — GLUCOSE, CAPILLARY
GLUCOSE-CAPILLARY: 122 mg/dL — AB (ref 70–99)
GLUCOSE-CAPILLARY: 142 mg/dL — AB (ref 70–99)
Glucose-Capillary: 128 mg/dL — ABNORMAL HIGH (ref 70–99)
Glucose-Capillary: 123 mg/dL — ABNORMAL HIGH (ref 70–99)
Glucose-Capillary: 117 mg/dL — ABNORMAL HIGH (ref 70–99)

## 2018-08-01 LAB — ABO/RH: ABO/RH(D): B POS

## 2018-08-01 LAB — PROTIME-INR
INR: 1.17
Prothrombin Time: 14.8 seconds (ref 11.4–15.2)

## 2018-08-01 SURGERY — FIXATION, FRACTURE, INTERTROCHANTERIC, WITH INTRAMEDULLARY ROD
Anesthesia: General | Laterality: Right

## 2018-08-01 MED ORDER — ONDANSETRON HCL 4 MG/2ML IJ SOLN
INTRAMUSCULAR | Status: DC | PRN
  Administered 2018-08-01: 4 mg via INTRAVENOUS

## 2018-08-01 MED ORDER — LIDOCAINE 2% (20 MG/ML) 5 ML SYRINGE
INTRAMUSCULAR | Status: DC | PRN
  Administered 2018-08-01: 60 mg via INTRAVENOUS

## 2018-08-01 MED ORDER — FERROUS SULFATE 325 (65 FE) MG PO TABS
325.0000 mg | ORAL_TABLET | Freq: Every day | ORAL | Status: DC
  Administered 2018-08-02 – 2018-08-10 (×9): 325 mg via ORAL
  Filled 2018-08-01 (×9): qty 1

## 2018-08-01 MED ORDER — LEVOTHYROXINE SODIUM 25 MCG PO TABS
137.0000 ug | ORAL_TABLET | Freq: Every day | ORAL | Status: DC
  Administered 2018-08-01 – 2018-08-10 (×10): 137 ug via ORAL
  Filled 2018-08-01 (×9): qty 1

## 2018-08-01 MED ORDER — NITROGLYCERIN 0.4 MG SL SUBL: 0.4000 mg | SUBLINGUAL_TABLET | SUBLINGUAL | Status: DC | PRN

## 2018-08-01 MED ORDER — SODIUM CHLORIDE 0.9 % IV SOLN
INTRAVENOUS | Status: DC | PRN
  Administered 2018-08-01: 25 ug/min via INTRAVENOUS

## 2018-08-01 MED ORDER — EPHEDRINE 5 MG/ML INJ
INTRAVENOUS | Status: AC
  Filled 2018-08-01: qty 10

## 2018-08-01 MED ORDER — ENOXAPARIN SODIUM 40 MG/0.4ML ~~LOC~~ SOLN
40.0000 mg | SUBCUTANEOUS | Status: DC
  Administered 2018-08-02 – 2018-08-10 (×8): 40 mg via SUBCUTANEOUS
  Filled 2018-08-01 (×9): qty 0.4

## 2018-08-01 MED ORDER — ATORVASTATIN CALCIUM 20 MG PO TABS
20.0000 mg | ORAL_TABLET | Freq: Every day | ORAL | Status: DC
  Administered 2018-08-02 – 2018-08-10 (×9): 20 mg via ORAL
  Filled 2018-08-01 (×9): qty 1

## 2018-08-01 MED ORDER — CEFAZOLIN SODIUM 1 G IJ SOLR
INTRAMUSCULAR | Status: AC
  Filled 2018-08-01: qty 10

## 2018-08-01 MED ORDER — VANCOMYCIN HCL 500 MG IV SOLR
INTRAVENOUS | Status: AC
  Filled 2018-08-01: qty 500

## 2018-08-01 MED ORDER — POVIDONE-IODINE 10 % EX SWAB: 2.0000 "application " | Freq: Once | CUTANEOUS | Status: DC

## 2018-08-01 MED ORDER — OXYCODONE HCL 5 MG PO TABS: 5.0000 mg | ORAL_TABLET | Freq: Once | ORAL | Status: DC | PRN

## 2018-08-01 MED ORDER — VANCOMYCIN HCL 1000 MG IV SOLR
INTRAVENOUS | Status: AC
  Filled 2018-08-01: qty 1000

## 2018-08-01 MED ORDER — CEFAZOLIN SODIUM-DEXTROSE 2-4 GM/100ML-% IV SOLN
2.0000 g | INTRAVENOUS | Status: AC
  Administered 2018-08-01: 2 g via INTRAVENOUS

## 2018-08-01 MED ORDER — EPHEDRINE SULFATE-NACL 50-0.9 MG/10ML-% IV SOSY
PREFILLED_SYRINGE | INTRAVENOUS | Status: DC | PRN
  Administered 2018-08-01 (×2): 10 mg via INTRAVENOUS
  Administered 2018-08-01: 5 mg via INTRAVENOUS
  Administered 2018-08-01: 10 mg via INTRAVENOUS
  Administered 2018-08-01: 5 mg via INTRAVENOUS
  Administered 2018-08-01: 10 mg via INTRAVENOUS

## 2018-08-01 MED ORDER — BACITRACIN ZINC 500 UNIT/GM EX OINT
TOPICAL_OINTMENT | CUTANEOUS | Status: AC
  Filled 2018-08-01: qty 28.35

## 2018-08-01 MED ORDER — ONDANSETRON HCL 4 MG/2ML IJ SOLN: 4.0000 mg | Freq: Once | INTRAMUSCULAR | Status: DC | PRN

## 2018-08-01 MED ORDER — CARVEDILOL 12.5 MG PO TABS
12.5000 mg | ORAL_TABLET | Freq: Two times a day (BID) | ORAL | Status: DC
  Administered 2018-08-01 – 2018-08-04 (×6): 12.5 mg via ORAL
  Filled 2018-08-01 (×7): qty 1

## 2018-08-01 MED ORDER — FENTANYL CITRATE (PF) 250 MCG/5ML IJ SOLN
INTRAMUSCULAR | Status: AC
  Filled 2018-08-01: qty 5

## 2018-08-01 MED ORDER — TEMAZEPAM 15 MG PO CAPS: 30.0000 mg | ORAL_CAPSULE | Freq: Every evening | ORAL | Status: DC | PRN

## 2018-08-01 MED ORDER — MORPHINE SULFATE (PF) 2 MG/ML IV SOLN
0.5000 mg | INTRAVENOUS | Status: DC | PRN
  Administered 2018-08-01 – 2018-08-02 (×4): 0.5 mg via INTRAVENOUS
  Filled 2018-08-01 (×4): qty 1

## 2018-08-01 MED ORDER — TAMSULOSIN HCL 0.4 MG PO CAPS
0.4000 mg | ORAL_CAPSULE | Freq: Every day | ORAL | Status: DC
  Administered 2018-08-01 – 2018-08-02 (×2): 0.4 mg via ORAL
  Filled 2018-08-01 (×2): qty 1

## 2018-08-01 MED ORDER — INSULIN ASPART 100 UNIT/ML ~~LOC~~ SOLN
0.0000 [IU] | SUBCUTANEOUS | Status: DC
  Administered 2018-08-01: 1 [IU] via SUBCUTANEOUS
  Administered 2018-08-03: 2 [IU] via SUBCUTANEOUS

## 2018-08-01 MED ORDER — CEFAZOLIN SODIUM-DEXTROSE 2-4 GM/100ML-% IV SOLN
2.0000 g | Freq: Three times a day (TID) | INTRAVENOUS | Status: AC
  Administered 2018-08-01 – 2018-08-02 (×3): 2 g via INTRAVENOUS
  Filled 2018-08-01 (×3): qty 100

## 2018-08-01 MED ORDER — AMIODARONE HCL 100 MG PO TABS
100.0000 mg | ORAL_TABLET | Freq: Every morning | ORAL | Status: DC
  Administered 2018-08-01 – 2018-08-10 (×10): 100 mg via ORAL
  Filled 2018-08-01 (×10): qty 1

## 2018-08-01 MED ORDER — OXYCODONE HCL 5 MG/5ML PO SOLN: 5.0000 mg | Freq: Once | ORAL | Status: DC | PRN

## 2018-08-01 MED ORDER — ALBUTEROL SULFATE HFA 108 (90 BASE) MCG/ACT IN AERS
INHALATION_SPRAY | RESPIRATORY_TRACT | Status: AC
  Filled 2018-08-01: qty 6.7

## 2018-08-01 MED ORDER — VANCOMYCIN HCL 1000 MG IV SOLR
INTRAVENOUS | Status: DC | PRN
  Administered 2018-08-01: 500 mg

## 2018-08-01 MED ORDER — CALCIUM CARBONATE ANTACID 500 MG PO CHEW
1.0000 | CHEWABLE_TABLET | Freq: Every day | ORAL | Status: DC
  Administered 2018-08-03 – 2018-08-10 (×8): 200 mg via ORAL
  Filled 2018-08-01 (×9): qty 1

## 2018-08-01 MED ORDER — LIDOCAINE 2% (20 MG/ML) 5 ML SYRINGE
INTRAMUSCULAR | Status: AC
  Filled 2018-08-01: qty 5

## 2018-08-01 MED ORDER — FENTANYL CITRATE (PF) 100 MCG/2ML IJ SOLN: 25.0000 ug | INTRAMUSCULAR | Status: DC | PRN

## 2018-08-01 MED ORDER — LATANOPROST 0.005 % OP SOLN
1.0000 [drp] | Freq: Every day | OPHTHALMIC | Status: DC
  Administered 2018-08-01 – 2018-08-09 (×9): 1 [drp] via OPHTHALMIC
  Filled 2018-08-01: qty 2.5

## 2018-08-01 MED ORDER — DEXMEDETOMIDINE HCL IN NACL 200 MCG/50ML IV SOLN
INTRAVENOUS | Status: AC
  Filled 2018-08-01: qty 50

## 2018-08-01 MED ORDER — PHENYLEPHRINE 40 MCG/ML (10ML) SYRINGE FOR IV PUSH (FOR BLOOD PRESSURE SUPPORT)
PREFILLED_SYRINGE | INTRAVENOUS | Status: AC
  Filled 2018-08-01: qty 10

## 2018-08-01 MED ORDER — GABAPENTIN 300 MG PO CAPS
300.0000 mg | ORAL_CAPSULE | Freq: Three times a day (TID) | ORAL | Status: DC
  Administered 2018-08-01 – 2018-08-10 (×26): 300 mg via ORAL
  Filled 2018-08-01 (×27): qty 1

## 2018-08-01 MED ORDER — PROPOFOL 10 MG/ML IV BOLUS
INTRAVENOUS | Status: DC | PRN
  Administered 2018-08-01: 50 mg via INTRAVENOUS

## 2018-08-01 MED ORDER — DORZOLAMIDE HCL-TIMOLOL MAL 2-0.5 % OP SOLN
1.0000 [drp] | Freq: Every day | OPHTHALMIC | Status: DC
  Administered 2018-08-01 – 2018-08-10 (×10): 1 [drp] via OPHTHALMIC
  Filled 2018-08-01: qty 10

## 2018-08-01 MED ORDER — ADULT MULTIVITAMIN W/MINERALS CH
1.0000 | ORAL_TABLET | Freq: Every day | ORAL | Status: DC
  Administered 2018-08-01 – 2018-08-10 (×10): 1 via ORAL
  Filled 2018-08-01 (×10): qty 1

## 2018-08-01 MED ORDER — CHLORHEXIDINE GLUCONATE 4 % EX LIQD: 60.0000 mL | Freq: Once | CUTANEOUS | Status: DC

## 2018-08-01 MED ORDER — LACTATED RINGERS IV SOLN
INTRAVENOUS | Status: DC
  Administered 2018-08-01: 11:00:00 via INTRAVENOUS

## 2018-08-01 MED ORDER — FENTANYL CITRATE (PF) 250 MCG/5ML IJ SOLN
INTRAMUSCULAR | Status: DC | PRN
  Administered 2018-08-01: 25 ug via INTRAVENOUS
  Administered 2018-08-01: 50 ug via INTRAVENOUS

## 2018-08-01 MED ORDER — PHENYLEPHRINE 40 MCG/ML (10ML) SYRINGE FOR IV PUSH (FOR BLOOD PRESSURE SUPPORT)
PREFILLED_SYRINGE | INTRAVENOUS | Status: DC | PRN
  Administered 2018-08-01: 80 ug via INTRAVENOUS
  Administered 2018-08-01 (×2): 120 ug via INTRAVENOUS
  Administered 2018-08-01: 80 ug via INTRAVENOUS

## 2018-08-01 MED ORDER — ONDANSETRON HCL 4 MG/2ML IJ SOLN
INTRAMUSCULAR | Status: AC
  Filled 2018-08-01: qty 2

## 2018-08-01 MED ORDER — 0.9 % SODIUM CHLORIDE (POUR BTL) OPTIME
TOPICAL | Status: DC | PRN
  Administered 2018-08-01: 1000 mL

## 2018-08-01 SURGICAL SUPPLY — 44 items
ADH SKN CLS APL DERMABOND .7 (GAUZE/BANDAGES/DRESSINGS) ×1
BIT DRILL FLUTED FEMUR 4.2/3 (BIT) ×2 IMPLANT
BLADE HELICAL TFNA 90 HIP (Anchor) ×1 IMPLANT
BLADE HELICAL TFNA 90MM HIP (Anchor) ×1 IMPLANT
BRUSH SCRUB SURG 4.25 DISP (MISCELLANEOUS) ×4 IMPLANT
CHLORAPREP W/TINT 26ML (MISCELLANEOUS) ×3 IMPLANT
COVER PERINEAL POST (MISCELLANEOUS) ×1 IMPLANT
COVER SURGICAL LIGHT HANDLE (MISCELLANEOUS) ×3 IMPLANT
COVER WAND RF STERILE (DRAPES) ×3 IMPLANT
DERMABOND ADVANCED (GAUZE/BANDAGES/DRESSINGS) ×2
DERMABOND ADVANCED .7 DNX12 (GAUZE/BANDAGES/DRESSINGS) ×1 IMPLANT
DRAPE HALF SHEET 40X57 (DRAPES) ×6 IMPLANT
DRAPE IMP U-DRAPE 54X76 (DRAPES) ×9 IMPLANT
DRAPE INCISE IOBAN 66X45 STRL (DRAPES) ×1 IMPLANT
DRAPE ORTHO SPLIT 87X125 STRL (DRAPES) ×4 IMPLANT
DRAPE STERI IOBAN 125X83 (DRAPES) ×3 IMPLANT
DRAPE SURG 17X23 STRL (DRAPES) ×6 IMPLANT
DRAPE U-SHAPE 47X51 STRL (DRAPES) ×3 IMPLANT
DRSG MEPILEX BORDER 4X4 (GAUZE/BANDAGES/DRESSINGS) ×3 IMPLANT
DRSG MEPILEX BORDER 4X8 (GAUZE/BANDAGES/DRESSINGS) ×3 IMPLANT
ELECT REM PT RETURN 9FT ADLT (ELECTROSURGICAL) ×3
ELECTRODE REM PT RTRN 9FT ADLT (ELECTROSURGICAL) ×1 IMPLANT
GLOVE BIO SURGEON STRL SZ7.5 (GLOVE) ×12 IMPLANT
GLOVE BIOGEL PI IND STRL 7.5 (GLOVE) ×1 IMPLANT
GLOVE BIOGEL PI INDICATOR 7.5 (GLOVE) ×2
GOWN STRL REUS W/ TWL LRG LVL3 (GOWN DISPOSABLE) ×1 IMPLANT
GOWN STRL REUS W/TWL LRG LVL3 (GOWN DISPOSABLE) ×3
GUIDEWIRE 3.2X400 (WIRE) ×4 IMPLANT
KIT BASIN OR (CUSTOM PROCEDURE TRAY) ×3 IMPLANT
KIT TURNOVER KIT B (KITS) ×3 IMPLANT
LINER BOOT UNIVERSAL DISP (MISCELLANEOUS) ×1 IMPLANT
MANIFOLD NEPTUNE II (INSTRUMENTS) ×3 IMPLANT
NAIL TROCH FIX 10X170 130 (Nail) ×2 IMPLANT
NS IRRIG 1000ML POUR BTL (IV SOLUTION) ×3 IMPLANT
PACK GENERAL/GYN (CUSTOM PROCEDURE TRAY) ×3 IMPLANT
PAD ARMBOARD 7.5X6 YLW CONV (MISCELLANEOUS) ×6 IMPLANT
SCREW LOCK STAR 5X38 (Screw) ×4 IMPLANT
SUT MNCRL AB 3-0 PS2 18 (SUTURE) ×3 IMPLANT
SUT VIC AB 0 CT1 27 (SUTURE)
SUT VIC AB 0 CT1 27XBRD ANBCTR (SUTURE) IMPLANT
SUT VIC AB 2-0 CT1 27 (SUTURE) ×6
SUT VIC AB 2-0 CT1 TAPERPNT 27 (SUTURE) ×2 IMPLANT
TOWEL OR 17X26 10 PK STRL BLUE (TOWEL DISPOSABLE) ×6 IMPLANT
WATER STERILE IRR 1000ML POUR (IV SOLUTION) ×3 IMPLANT

## 2018-08-01 NOTE — Op Note (Signed)
OrthopaedicSurgeryOperativeNote [id number]) Date of Surgery: 08/01/2018  Admit Date: 07/31/2018   Diagnoses: Pre-Op Diagnoses: Right intertrochanteric femur fracture   Post-Op Diagnosis: Same  Procedures: CPT 27245-Cephalomedullary nailing of right intertrochanteric femur fracture  Surgeons: Primary: Shona Needles, MD   Location:[facility] OR ROOM 03   AnesthesiaGeneral   Antibiotics:Ancef 2g preop   Tourniquettime:None  [id number]:66 mL   Complications:None  Specimens:None  Implants: Implant Name Type Inv. Item Serial No. Manufacturer Lot No. LRB No. Used Action  NAIL TROCH FIX [id number] 130 - [id number] Nail NAIL TROCH FIX [id number] 130  [employer]  Right 1 Implanted  BLADE HELICAL TFNA 95GL HIP - [id number] Anchor BLADE HELICAL TFNA 51OA HIP  [employer]  Right 1 Implanted    IndicationsforSurgery: 84 year old male with right intertrochanteric femur fracture. Due to the need to mobilize him quickly and allow him to restore ambulatory capabilities I feel that cephalo-medullary nailing is most appropriate.  Risks and benefits were discussed with the patient. Risks discussed included bleeding requiring blood transfusion, bleeding causing a hematoma, infection, malunion, nonunion, damage to surrounding nerves and blood vessels, pain, hardware prominence or irritation, hardware failure, stiffness, post-traumatic arthritis, DVT/PE, compartment syndrome, and even death. He agrees to proceed with surgery and consent was obtained.  Operative Findings: Cephalomedullary nailing with [employer] short TFN [id number] with 90 mm helical blade  Procedure: The patient was identified in the preoperative holding area. Consent was confirmed with the patient and their family and all questions were answered. The operative extremity was marked after confirmation with the patient and they were then brought back to the operating room by our anesthesia colleagues. The patient was  placed under general anesthesia and then carefully transferred over to a radiolucent flat top table. The operative extremity was prepped and draped in sterile fashion. Preincision timeout was performed to verify the patient, the procedure and the extremity. Preoperative antibiotics were dosed.  Traction was provided by an Environmental consultant. A small incision was made proximal to the greater trochanter. A curved Mayo scissors was used to spread down to the greater trochanter in line with the abductor musculature. A threaded guidepin was positioned at an appropriate starting point on the AP and lateral views. It was advanced in the femur past the lesser trochanter. A entry reamer with soft tissue protector was then used to enter the canal. A radiographic ruler was used to judge the size of the canal of the femur and a 10 mm short nail was placed into the canal and seated down to an appropriate position radiographically. The targeting arm for the helical blade was attached. A percutaneous incision was made for the guide for the helical blade. A threaded guidepin was placed into the femoral neck and head and fluoroscopy was used to confirm adequate placement with an acceptable tip-apex distance. A drill was used to perforate the lateral cortex and 90 mm helical blade was inserted into the head/neck segment. The set screw was then tightened to set rotation and backed off to allow compression.The aiming arm was removed from the nail and position of the helical blade was confirmed with fluoroscopy. Using the targeting arm a distal interlock was placed bicortically in the shaft.  Final fluoroscopic images were obtained and the incisions were copiously irrigated. The skin was closed with 2-0 vicryl, 3-0 monocryl and sealed with dermabond. The incisions were dressing with Mepilex dressings. The patient was carefully transferred to the regular floor bed and was taken to PACU in stable condition.  Post Op  Plan/Instructions: Weightbearing as tolerated to right lower extremity. Ancef for antibiotic prophylaxis. Lovenox for DVT prophylaxis. Mobilize with physical and occupational therapy.  I was present and performed the entire surgery.  Katha Hamming, MD [facility]

## 2018-08-01 NOTE — Progress Notes (Signed)
Initial Nutrition Assessment  DOCUMENTATION CODES:   Not applicable  INTERVENTION:   Encourage good PO intake of protein-rich foods when diet advances. Add MVI with minerals given ecchymosis.   NUTRITION DIAGNOSIS:   Inadequate oral intake related to hip fracture as evidenced by NPO status.   GOAL:   Patient will meet greater than or equal to 90% of their needs   MONITOR:   Diet advancement, Weight trends, Labs  REASON FOR ASSESSMENT:   Consult Hip fracture protocol  ASSESSMENT:   84 yo male, admitted after a fall resulting in a hip fracture with surgical repair planned for 10/11. PMHx includes ischemic cardiomyopathy s/p ICD placement, T2DM, HTN, hypothyroidism, HLD, CHF, colon polyps, diverticulosis. Walks with a cane.  Labs: potassium 5.3, glucose 163, Creatinine 1.71, GFR 36, Hgb 9.9, Hct 31.1, MCV 100.3  Meds: lipitor, tums, novolog, levothyroxine  Pt in OR at time of visit. Unable to assess at this time - will need full assessment and nutrition-focused physical exam. Per chart, pt weight down 5.7 kg since Aug 2018; more recent weights unavailable.     NUTRITION - FOCUSED PHYSICAL EXAM:     Unable to complete at this visit - pt in OR  Diet Order:   Diet Order            Diet NPO time specified  Diet effective now              EDUCATION NEEDS:   No education needs have been identified at this time  Skin:  Skin Assessment: Skin Integrity Issues: Skin Integrity Issues:: Other (Comment) Other: ecchymosis  Last BM:  no BM  Height:   Ht Readings from Last 1 Encounters:  07/31/18 5\' 6"  (1.676 m)    Weight:   Wt Readings from Last 1 Encounters:  07/31/18 70.8 kg    Ideal Body Weight:  64.5 kg  BMI:  Body mass index is 25.18 kg/m.  Estimated Nutritional Needs:   Kcal:  6286 kcal/day(MSJ x 1.2)  Protein:  71-85 g protein/day(1.0-1.2 g/kg ABW)  Fluid:  1 mL/kcal or per MD discretion  Althea Grimmer, MS, RDN, LDN On-call pager:  [phone]

## 2018-08-01 NOTE — Progress Notes (Signed)
Patient retaining urine 665ml. Text paged Dr Hal Hope, orders received for in and out cath.

## 2018-08-01 NOTE — Progress Notes (Signed)
Orthopedic Tech Progress Note Patient Details:  Gregory [name] 1933/03/24 [id number]  Patient ID: [name], male   DOB: [birth year], 84 y.o.   MRN: [id number]   [name] 08/01/2018, 1:52 PMTrapeze bar

## 2018-08-01 NOTE — Progress Notes (Signed)
PROGRESS NOTE  Gregory [name] [id number]:[id number] DOB: April 03, 1933 DOA: 07/31/2018 PCP: Christain Sacramento, MD  HPI/Recap of past 56 hours: 84 year old male with past medical history of ischemic cardiomyopathy with ejection fraction of 25% status post ICD placement plus diabetes, hypertension and hypothyroidism who sustained a mechanical fall on evening of 10/10.  Patient did not hit his head or lose consciousness.  Since the fall, patient has been having right hip pain so he was brought into the emergency room where x-rays revealed a nondisplaced right intertrochanteric femur fracture.  Patient admitted to the hospitalist service.  Cardiology was consulted and formally cleared patient on morning of 10/11.  Patient was seen by orthopedic surgery and taken for right IM nail later that morning.  Patient seen prior to surgery.  Complains of some soreness, but otherwise doing well, no complaints.  No chest pain.  Assessment/Plan: Principal Problem:   Closed right hip fracture, initial encounter [facility]): Cleared by cardiology.  Status post IM nail.  Monitor for blood loss anemia.  Skilled nursing likely Monday Active Problems: :   Hypothyroidism: Continue Synthroid post surgery   Diabetes mellitus ([place]): Sliding scale   Ischemic cardiomyopathy/chronic systolic heart failure status post ICD.  ICD checked less than 2 weeks ago, working well.  Currently euvolemic, but will restart Lasix post surgery.  Continued on beta-blocker.  Stage III chronic kidney disease: Stable, GFR currently at 36.   Code Status: Full code  Family Communication: Wife at the bedside  Disposition Plan: Anticipate skilled nursing, potentially as early as Monday   Consultants:  Orthopedics-[place]  Cardiology-Croitoru  Procedures:  Status post IM nail of right hip done 10/11  Antimicrobials:  Preop Ancef  DVT prophylaxis: Lovenox   Objective: Vitals:   08/01/18 1405 08/01/18 1422  BP: (!) 131/52 123/77  Pulse:  60 (!) 56  Resp: 15 16  Temp: 98.2 F (36.8 C) 97.7 F (36.5 C)  SpO2: 98% 100%    Intake/Output Summary (Last 24 hours) at 08/01/2018 1432 Last data filed at 08/01/2018 1409 Gross per 24 hour  Intake 850 ml  Output 75 ml  Net 775 ml   Filed Weights   07/31/18 2201  Weight: 70.8 kg   Body mass index is 25.18 kg/m.  Exam:   General: Alert and oriented x3, hard of hearing, but no acute distress  HEENT: Normocephalic and atraumatic, mucous memories are slightly dry  Neck: Supple, no JVD  Cardiovascular: Regular rate and rhythm, S1-S2  Respiratory: Clear to auscultation bilaterally  Abdomen: Soft, nontender, nondistended, positive bowel sounds  Musculoskeletal: No clubbing or cyanosis, trace pitting edema.  Further muscular skeletal exam deferred while patient has acute fracture  Skin: No skin breaks, tears or lesions  Psychiatry: Appropriate, no evidence of psychoses   Data Reviewed: CBC: Recent Labs  Lab 07/31/18 2318 08/01/18 0518  WBC 10.7* 11.3*  NEUTROABS 6.8  --   HGB 10.8* 9.9*  HCT 33.6* 31.1*  MCV 101.8* 100.3*  PLT 175 163   Basic Metabolic Panel: Recent Labs  Lab 07/31/18 2318 08/01/18 0518  NA 136 138  K 4.8 5.3*  CL 100 100  CO2 28 29  GLUCOSE 155* 163*  BUN 23 23  CREATININE 1.70* 1.71*  CALCIUM 8.8* 8.7*   GFR: Estimated Creatinine Clearance: 30.1 mL/min (A) (by C-G formula based on SCr of 1.71 mg/dL (H)). Liver Function Tests: No results for input(s): AST, ALT, ALKPHOS, BILITOT, PROT, ALBUMIN in the last 168 hours. No results for input(s): LIPASE, AMYLASE  in the last 168 hours. No results for input(s): AMMONIA in the last 168 hours. Coagulation Profile: Recent Labs  Lab 07/31/18 2325  INR 1.17   Cardiac Enzymes: No results for input(s): CKTOTAL, CKMB, CKMBINDEX, TROPONINI in the last 168 hours. BNP (last 3 results) No results for input(s): PROBNP in the last 8760 hours. HbA1C: No results for input(s): HGBA1C in the  last 72 hours. CBG: Recent Labs  Lab 08/01/18 0657 08/01/18 1024 08/01/18 1326  GLUCAP 128* 123* 117*   Lipid Profile: No results for input(s): CHOL, HDL, LDLCALC, TRIG, CHOLHDL, LDLDIRECT in the last 72 hours. Thyroid Function Tests: No results for input(s): TSH, T4TOTAL, FREET4, T3FREE, THYROIDAB in the last 72 hours. Anemia Panel: No results for input(s): VITAMINB12, FOLATE, FERRITIN, TIBC, IRON, RETICCTPCT in the last 72 hours. Urine analysis: No results found for: COLORURINE, APPEARANCEUR, LABSPEC, PHURINE, GLUCOSEU, HGBUR, BILIRUBINUR, KETONESUR, PROTEINUR, UROBILINOGEN, NITRITE, LEUKOCYTESUR Sepsis Labs: @LABRCNTIP (procalcitonin:4,lacticidven:4)  ) Recent Results (from the past 240 hour(s))  Surgical pcr screen     Status: None   Collection Time: 08/01/18  2:55 AM  Result Value Ref Range Status   MRSA, PCR NEGATIVE NEGATIVE Final   Staphylococcus aureus NEGATIVE NEGATIVE Final    Comment: (NOTE) The Xpert SA Assay (FDA approved for NASAL specimens in patients 20 years of age and older), is one component of a comprehensive surveillance program. It is not intended to diagnose infection nor to guide or monitor treatment. Performed at [facility], [place] [address], [place], [place] [address]       Studies: Dg Pelvis Portable  Result Date: 08/01/2018 CLINICAL DATA:  Right hip ORIF. EXAM: PORTABLE PELVIS 1-2 VIEWS COMPARISON:  Intraoperative radiographs of the same day. FINDINGS: Single AP view pelvis demonstrates a proximal right femoral IM rod with a dynamic hip screw. Right intratrochanteric fracture is reduced. Extensive vascular calcifications are present. IMPRESSION: ORIF of right intertrochanteric fracture without radiographic evidence for complication. Electronically Signed   By: San Morelle M.D.   On: 08/01/2018 14:22   Dg Chest Port 1 View  Result Date: 08/01/2018 CLINICAL DATA:  Preoperative for hip fracture. EXAM: PORTABLE CHEST 1 VIEW  COMPARISON:  03/31/2015 FINDINGS: Postoperative changes in the mediastinum. Cardiac pacemaker. Shallow inspiration. Cardiac enlargement without vascular congestion. No edema or consolidation. There is chronic blunting of the left costophrenic angle probably due to pleural thickening. No pneumothorax. Calcification of the aorta. IMPRESSION: Cardiac enlargement. No evidence of active pulmonary disease. Chronic blunting of the left costophrenic angle probably due to pleural thickening. Electronically Signed   By: Lucienne Capers M.D.   On: 08/01/2018 00:20   Dg C-arm 1-60 Min  Result Date: 08/01/2018 CLINICAL DATA:  Hip fracture fixation EXAM: DG C-ARM 61-120 MIN; OPERATIVE RIGHT HIP WITH PELVIS COMPARISON:  And 1,019 FINDINGS: Intertrochanteric fracture has been fixed with a rod and screw. Fracture alignment satisfactory. Hardware positioning is satisfactory. IMPRESSION: Surgical fixation of intertrochanteric fracture. Electronically Signed   By: Franchot Gallo M.D.   On: 08/01/2018 14:04   Dg Hip Operative Unilat W Or W/o Pelvis Right  Result Date: 08/01/2018 CLINICAL DATA:  Hip fracture fixation EXAM: DG C-ARM 61-120 MIN; OPERATIVE RIGHT HIP WITH PELVIS COMPARISON:  And 1,019 FINDINGS: Intertrochanteric fracture has been fixed with a rod and screw. Fracture alignment satisfactory. Hardware positioning is satisfactory. IMPRESSION: Surgical fixation of intertrochanteric fracture. Electronically Signed   By: Franchot Gallo M.D.   On: 08/01/2018 14:04   Dg Hip Unilat  With Pelvis 2-3 Views Right  Result Date: 07/31/2018 CLINICAL DATA:  Fall with right hip pain EXAM: DG HIP (WITH OR WITHOUT PELVIS) 2-3V RIGHT COMPARISON:  None. FINDINGS: Nondisplaced intertrochanteric fracture of the proximal right femur. Femoroacetabular joint is approximated. No pelvic fracture or diastasis. Normal left hip. Extensive vascular calcification. IMPRESSION: Nondisplaced intertrochanteric fracture of the right femur.  Electronically Signed   By: Ulyses Jarred M.D.   On: 07/31/2018 23:02    Scheduled Meds: . amiodarone  100 mg Oral q morning - 10a  . atorvastatin  20 mg Oral Daily  . calcium carbonate  1 tablet Oral Daily  . carvedilol  12.5 mg Oral BID WC  . dorzolamide-timolol  1 drop Left Eye Daily  . [START ON 08/02/2018] enoxaparin (LOVENOX) injection  40 mg Subcutaneous Q24H  . ferrous sulfate  325 mg Oral Q breakfast  . gabapentin  300 mg Oral TID  . insulin aspart  0-9 Units Subcutaneous Q4H  . latanoprost  1 drop Left Eye QHS  . levothyroxine  137 mcg Oral Daily  . multivitamin with minerals  1 tablet Oral Daily  . tamsulosin  0.4 mg Oral QPC breakfast    Continuous Infusions: .  ceFAZolin (ANCEF) IV    . lactated ringers 10 mL/hr at 08/01/18 1145     LOS: 0 days     Annita Brod, MD [facility]  To reach me or the doctor on call, go to: [email or web address] Password [email or web address]  08/01/2018, 2:32 PM

## 2018-08-01 NOTE — Transfer of Care (Signed)
Immediate Anesthesia Transfer of Care Note  Patient: Gregory [name]  Procedure(s) Performed: INTRAMEDULLARY (IM) NAIL INTERTROCHANTRIC right hip (Right )  Patient Location: PACU  Anesthesia Type:General  Level of Consciousness: drowsy and patient cooperative  Airway & Oxygen Therapy: Patient Spontanous Breathing and Patient connected to nasal cannula oxygen  Post-op Assessment: Report given to RN and Post -op Vital signs reviewed and stable  Post vital signs: Reviewed and stable  Last Vitals:  Vitals Value Taken Time  BP 125/66 08/01/2018  1:18 PM  Temp    Pulse 62 08/01/2018  1:21 PM  Resp 26 08/01/2018  1:21 PM  SpO2 99 % 08/01/2018  1:21 PM  Vitals shown include unvalidated device data.  Last Pain:  Vitals:   08/01/18 0900  TempSrc:   PainSc: 0-No pain         Complications: No apparent anesthesia complications

## 2018-08-01 NOTE — H&P (Signed)
History and Physical    Gregory [name] LPF:[id number] DOB: 1933-04-24 DOA: 07/31/2018  PCP: Christain Sacramento, MD  Patient coming from: Home.  Chief Complaint: Fall.  HPI: Gregory [name] is a 84 y.o. male with history of ischemic cardiomyopathy status post ICD placement and on amiodarone, diabetes mellitus type 2, hypertension, hypothyroidism and hyperlipidemia was brought from the home after patient had a fall.  Patient states he usually uses a cane to walk but tripped and fell.  Denies getting hit his head or losing consciousness.  Since the fall patient has been having right hip pain and was brought to the ER.  Denies any chest pain shortness of breath palpitations or any focal deficits.  ED Course: X-rays revealed right hip fracture and on-call orthopedic surgeon was consulted and plan for surgery in the morning.  Patient not in any acute respiratory distress and on my exam denies any chest pain.  Review of Systems: As per HPI, rest all negative.   Past Medical History:  Diagnosis Date  . Abnormality of gait 09/19/2016  . Allergy   . Cataract   . CHF (congestive heart failure) ([place])   . Colon polyps   . Diabetes mellitus   . Diabetic neuropathy ([place])   . Diverticulosis   . Dyslipidemia   . Essential hypertension   . Glaucoma   . Hyperlipidemia   . Hypothyroidism   . ICD (implantable cardiac defibrillator) in place 11-2004  . Internal hemorrhoid   . Ischemic cardiomyopathy   . Ischemic heart disease   . Myocardial infarction [facility]) 2006   LARGE ANTERIOR WALL    Past Surgical History:  Procedure Laterality Date  . CARDIAC DEFIBRILLATOR PLACEMENT    . CATARACT EXTRACTION     left  . CHOLECYSTECTOMY    . CHOLECYSTECTOMY, LAPAROSCOPIC    . COLONOSCOPY N/A 12/01/2012   Procedure: COLONOSCOPY;  Surgeon: Irene Shipper, MD;  Location: [facility] ENDOSCOPY;  Service: Endoscopy;  Laterality: N/A;  . CORONARY ARTERY BYPASS GRAFT  12/02/04  . ESOPHAGOGASTRODUODENOSCOPY N/A 12/01/2012    Procedure: ESOPHAGOGASTRODUODENOSCOPY (EGD);  Surgeon: Irene Shipper, MD;  Location: [facility];  Service: Endoscopy;  Laterality: N/A;  . 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 IMPLANTABLE CARDIOVERTER DEFIBRILLATOR (ICD) GENERATOR CHANGE N/A 03/11/2014   Procedure: ICD GENERATOR CHANGE;  Surgeon: Evans Lance, MD;  Location: [facility] CATH LAB;  Service: Cardiovascular;  Laterality: N/A;  . SHOULDER SURGERY     RIGHT  . TONSILLECTOMY       reports that he quit smoking about 13 years ago. His smoking use included cigarettes. He has never used smokeless tobacco. He reports that he does not drink alcohol or use drugs.  No Known Allergies  Family History  Problem Relation Age of Onset  . Diabetes Mother   . Prostate cancer Father   . Cancer Daughter        ? unknown, in stomach  . Diabetes Unknown   . Colon cancer Neg Hx   . Esophageal cancer Neg Hx   . Rectal cancer Neg Hx   . Stomach cancer Neg Hx     Prior to Admission medications   Medication Sig Start Date End Date Taking? Authorizing Provider  acetaminophen (TYLENOL) 500 MG tablet Take 500 mg by mouth every 6 (six) hours as needed. For pain    [provider]  amiodarone (PACERONE) 200 MG tablet TAKE 1/2 TABLET BY MOUTH EVERY MORNING 06/26/18  Josue Hector, MD  aspirin EC 325 MG tablet Take 325 mg by mouth every morning.    [provider]  atorvastatin (LIPITOR) 20 MG tablet Take 1 tablet (20 mg total) by mouth daily. 07/23/16   Josue Hector, MD  calcium carbonate (TUMS EX) 750 MG chewable tablet Chew 1 tablet by mouth daily.     [provider]  carvedilol (COREG) 12.5 MG tablet TAKE 1 TABLET (12.5 MG TOTAL) BY MOUTH 2 (TWO) TIMES DAILY WITH A MEAL. 01/17/18   Josue Hector, MD  cetirizine (ZYRTEC) 10 MG tablet Take 10 mg by mouth daily.    [provider]  cholecalciferol (VITAMIN D) 1000 UNITS tablet Take 1,000 Units by mouth daily.    [provider]    diphenhydrAMINE (BENADRYL) 25 mg capsule Take 25 mg by mouth daily as needed for allergies.     [provider]  dorzolamide-timolol (COSOPT) 22.3-6.8 MG/ML ophthalmic solution Place 1 drop into the left eye daily.    [provider]  ferrous sulfate 325 (65 FE) MG tablet Take 325 mg by mouth daily with breakfast.    [provider]  furosemide (LASIX) 40 MG tablet Take 1 tablet (40 mg total) by mouth daily. 11/30/14   Darlin Coco, MD  gabapentin (NEURONTIN) 300 MG capsule Take 300 mg by mouth 3 (three) times daily.     [provider]  Inulin (METAMUCIL CLEAR & NATURAL) POWD Take 15 mLs by mouth daily as needed (fiber).  05/18/13   Darlin Coco, MD  latanoprost (XALATAN) 0.005 % ophthalmic solution Place 1 drop into the left eye at bedtime.    [provider]  metFORMIN (GLUCOPHAGE) 500 MG tablet Take 500 mg by mouth 2 (two) times daily.  09/07/12   [provider]  Multiple Vitamins-Minerals (CENTRUM SILVER PO) Take 1 tablet by mouth daily.     [provider]  nitroGLYCERIN (NITROSTAT) 0.4 MG SL tablet Place 1 tablet (0.4 mg total) under the tongue every 5 (five) minutes as needed for chest pain. 05/04/16   Evans Lance, MD  promethazine (PHENERGAN) 25 MG tablet Take 25 mg by mouth every 4 (four) hours as needed. nausea 10/26/15   [provider]  ramipril (ALTACE) 1.25 MG capsule TAKE 1 CAPSULE BY MOUTH EVERY DAY Patient taking differently: Take 1.25 mg by mouth daily.  09/09/17   Josue Hector, MD  SYNTHROID 137 MCG tablet Take 137 mcg by mouth daily. 10/15/15   [provider]  tamsulosin (FLOMAX) 0.4 MG CAPS capsule Take 0.4 mg by mouth daily after breakfast.  02/02/14   [provider]  temazepam (RESTORIL) 30 MG capsule Take 30 mg by mouth at bedtime as needed for sleep.     [provider]    Physical Exam: Vitals:   08/01/18 0000 08/01/18 0030 08/01/18 0100 08/01/18 0203  BP:  (!) 132/53 (!) 130/55 130/63 (!) 142/62  Pulse: 61 (!) 56 64 62  Resp: 18 17 18 18   Temp:    97.9 F (36.6 C)  TempSrc:    Oral  SpO2: 100% 100% 100% 93%  Weight:      Height:          Constitutional: Moderately built and nourished. Vitals:   08/01/18 0000 08/01/18 0030 08/01/18 0100 08/01/18 0203  BP: (!) 132/53 (!) 130/55 130/63 (!) 142/62  Pulse: 61 (!) 56 64 62  Resp: 18 17 18 18   Temp:    97.9 F (  36.6 C)  TempSrc:    Oral  SpO2: 100% 100% 100% 93%  Weight:      Height:       Eyes: Anicteric no pallor. ENMT: No discharge from the ears eyes nose or mouth. Neck: No JVD appreciated no mass felt.   Respiratory: No rhonchi or crepitations. Cardiovascular: S1-S2 heard no murmurs appreciated. Abdomen: Soft nontender bowel sounds present. Musculoskeletal: No edema.  Pain on moving right hip. Skin: No rash. Neurologic: Alert awake oriented to time place and person.  Moves all extremities. Psychiatric: Appears normal per normal affect.   Labs on Admission: I have personally reviewed following labs and imaging studies  CBC: Recent Labs  Lab 07/31/18 2318  WBC 10.7*  NEUTROABS 6.8  HGB 10.8*  HCT 33.6*  MCV 101.8*  PLT 035   Basic Metabolic Panel: Recent Labs  Lab 07/31/18 2318  NA 136  K 4.8  CL 100  CO2 28  GLUCOSE 155*  BUN 23  CREATININE 1.70*  CALCIUM 8.8*   GFR: Estimated Creatinine Clearance: 30.2 mL/min (A) (by C-G formula based on SCr of 1.7 mg/dL (H)). Liver Function Tests: No results for input(s): AST, ALT, ALKPHOS, BILITOT, PROT, ALBUMIN in the last 168 hours. No results for input(s): LIPASE, AMYLASE in the last 168 hours. No results for input(s): AMMONIA in the last 168 hours. Coagulation Profile: Recent Labs  Lab 07/31/18 2325  INR 1.17   Cardiac Enzymes: No results for input(s): CKTOTAL, CKMB, CKMBINDEX, TROPONINI in the last 168 hours. BNP (last 3 results) No results for input(s): PROBNP in the last 8760 hours. HbA1C: No  results for input(s): HGBA1C in the last 72 hours. CBG: No results for input(s): GLUCAP in the last 168 hours. Lipid Profile: No results for input(s): CHOL, HDL, LDLCALC, TRIG, CHOLHDL, LDLDIRECT in the last 72 hours. Thyroid Function Tests: No results for input(s): TSH, T4TOTAL, FREET4, T3FREE, THYROIDAB in the last 72 hours. Anemia Panel: No results for input(s): VITAMINB12, FOLATE, FERRITIN, TIBC, IRON, RETICCTPCT in the last 72 hours. Urine analysis: No results found for: COLORURINE, APPEARANCEUR, LABSPEC, PHURINE, GLUCOSEU, HGBUR, BILIRUBINUR, KETONESUR, PROTEINUR, UROBILINOGEN, NITRITE, LEUKOCYTESUR Sepsis Labs: @LABRCNTIP (procalcitonin:4,lacticidven:4) )No results found for this or any previous visit (from the past 240 hour(s)).   Radiological Exams on Admission: Dg Chest Port 1 View  Result Date: 08/01/2018 CLINICAL DATA:  Preoperative for hip fracture. EXAM: PORTABLE CHEST 1 VIEW COMPARISON:  03/31/2015 FINDINGS: Postoperative changes in the mediastinum. Cardiac pacemaker. Shallow inspiration. Cardiac enlargement without vascular congestion. No edema or consolidation. There is chronic blunting of the left costophrenic angle probably due to pleural thickening. No pneumothorax. Calcification of the aorta. IMPRESSION: Cardiac enlargement. No evidence of active pulmonary disease. Chronic blunting of the left costophrenic angle probably due to pleural thickening. Electronically Signed   By: Lucienne Capers M.D.   On: 08/01/2018 00:20   Dg Hip Unilat  With Pelvis 2-3 Views Right  Result Date: 07/31/2018 CLINICAL DATA:  Fall with right hip pain EXAM: DG HIP (WITH OR WITHOUT PELVIS) 2-3V RIGHT COMPARISON:  None. FINDINGS: Nondisplaced intertrochanteric fracture of the proximal right femur. Femoroacetabular joint is approximated. No pelvic fracture or diastasis. Normal left hip. Extensive vascular calcification. IMPRESSION: Nondisplaced intertrochanteric fracture of the right femur.  Electronically Signed   By: Ulyses Jarred M.D.   On: 07/31/2018 23:02    EKG: Independently reviewed.  Indeterminant rhythm with RBBB.  Assessment/Plan Principal Problem:   Closed right hip fracture, initial encounter [facility]) Active Problems:   Implantable cardioverter-defibrillator (ICD)  in situ   Hypothyroidism   Diabetes mellitus ([place])   Ischemic cardiomyopathy    1. Right hip fracture status post mechanical fall -patient is 80 mm for moderate risk procedure.  Will notify cardiology since patient has ICD/pacemaker.  We will keep patient n.p.o. except medication in anticipation of surgery.  Pain relief medications. 2. History of ischemic cardiomyopathy last EF measured was 25 to 30% in April 2015.  Appears compensated.  Patient is on Lasix which will be held for surgery and restart after surgery when clinically appropriate.  On Coreg and amiodarone ACE inhibitor. 3. Hypothyroidism on Synthroid. 4. Diabetes mellitus type 2 with hold metformin and keep patient on sliding scale coverage for now. 5. Hyperlipidemia on statins. 6. Hypertension on lisinopril and Coreg. 7. Macrocytic anemia on iron supplements.   DVT prophylaxis: SCDs in anticipation of surgery. Code Status: Full code. Family Communication: Discussed with patient. Disposition Plan: Probably rehab. Consults called: Orthopedics.  Cardiology notified. Admission status: Inpatient.   Rise Patience MD [facility] Pager [phone].  If 7PM-7AM, please contact night-coverage [email or web address] Password [email or web address]  08/01/2018, 2:28 AM

## 2018-08-01 NOTE — Consult Note (Signed)
Reason for Consult:Right hip fx Referring Physician: [name] is an 84 y.o. male.  HPI: Gregory [name] was going back to his recliner and was looking at his mail. He went to sit down and was too far to the side. He sat on the arm of the chair and then fell off to the side. He had immediate pain and could not get up. He was brought to the ED where x-rays showed a right intertroch hip fx and orthopedic surgery was consulted. He lives at home and usually ambulates with a cane.  Past Medical History:  Diagnosis Date  . Abnormality of gait 09/19/2016  . Allergy   . Cataract   . CHF (congestive heart failure) ([place])   . Colon polyps   . Diabetes mellitus   . Diabetic neuropathy ([place])   . Diverticulosis   . Dyslipidemia   . Essential hypertension   . Glaucoma   . Hyperlipidemia   . Hypothyroidism   . ICD (implantable cardiac defibrillator) in place 11-2004  . Internal hemorrhoid   . Ischemic cardiomyopathy   . Ischemic heart disease   . Myocardial infarction [facility]) 2006   LARGE ANTERIOR WALL    Past Surgical History:  Procedure Laterality Date  . CARDIAC DEFIBRILLATOR PLACEMENT    . CATARACT EXTRACTION     left  . CHOLECYSTECTOMY    . CHOLECYSTECTOMY, LAPAROSCOPIC    . COLONOSCOPY N/A 12/01/2012   Procedure: COLONOSCOPY;  Surgeon: Irene Shipper, MD;  Location: [facility] ENDOSCOPY;  Service: Endoscopy;  Laterality: N/A;  . CORONARY ARTERY BYPASS GRAFT  12/02/04  . ESOPHAGOGASTRODUODENOSCOPY N/A 12/01/2012   Procedure: ESOPHAGOGASTRODUODENOSCOPY (EGD);  Surgeon: Irene Shipper, MD;  Location: [facility];  Service: Endoscopy;  Laterality: N/A;  . 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 IMPLANTABLE CARDIOVERTER DEFIBRILLATOR (ICD) GENERATOR CHANGE N/A 03/11/2014   Procedure: ICD GENERATOR CHANGE;  Surgeon: Evans Lance, MD;  Location: [facility] CATH LAB;  Service: Cardiovascular;  Laterality: N/A;  . SHOULDER SURGERY     RIGHT  . TONSILLECTOMY      Family History   Problem Relation Age of Onset  . Diabetes Mother   . Prostate cancer Father   . Cancer Daughter        ? unknown, in stomach  . Diabetes Unknown   . Colon cancer Neg Hx   . Esophageal cancer Neg Hx   . Rectal cancer Neg Hx   . Stomach cancer Neg Hx     Social History:  reports that he quit smoking about 13 years ago. His smoking use included cigarettes. He has never used smokeless tobacco. He reports that he does not drink alcohol or use drugs.  Allergies: No Known Allergies  Medications: I have reviewed the patient's current medications.  Results for orders placed or performed during the hospital encounter of 07/31/18 (from the past 48 hour(s))  CBC with Differential     Status: Abnormal   Collection Time: 07/31/18 11:18 PM  Result Value Ref Range   WBC 10.7 (H) 4.0 - 10.5 K/uL   RBC 3.30 (L) 4.22 - 5.81 MIL/uL   Hemoglobin 10.8 (L) 13.0 - 17.0 g/dL   HCT 33.6 (L) 39.0 - 52.0 %   MCV 101.8 (H) 80.0 - 100.0 fL   MCH 32.7 26.0 - 34.0 pg   MCHC 32.1 30.0 - 36.0 g/dL   RDW 13.4 11.5 - 15.5 %   Platelets 175 150 - 400 K/uL  nRBC 0.0 0.0 - 0.2 %   Neutrophils Relative % 62 %   Neutro Abs 6.8 1.7 - 7.7 K/uL   Lymphocytes Relative 22 %   Lymphs Abs 2.3 0.7 - 4.0 K/uL   Monocytes Relative 9 %   Monocytes Absolute 1.0 0.1 - 1.0 K/uL   Eosinophils Relative 5 %   Eosinophils Absolute 0.5 0.0 - 0.5 K/uL   Basophils Relative 1 %   Basophils Absolute 0.1 0.0 - 0.1 K/uL   Immature Granulocytes 1 %   Abs Immature Granulocytes 0.08 (H) 0.00 - 0.07 K/uL    Comment: Performed at [facility] [address], [place], [place] [address]  Basic metabolic panel     Status: Abnormal   Collection Time: 07/31/18 11:18 PM  Result Value Ref Range   Sodium 136 135 - 145 mmol/L   Potassium 4.8 3.5 - 5.1 mmol/L   Chloride 100 98 - 111 mmol/L   CO2 28 22 - 32 mmol/L   Glucose, Bld 155 (H) 70 - 99 mg/dL   BUN 23 8 - 23 mg/dL   Creatinine, Ser 1.70 (H) 0.61 - 1.24 mg/dL   Calcium 8.8  (L) 8.9 - 10.3 mg/dL   GFR calc non Af Amer 36 (L) >60 mL/min   GFR calc Af Amer 41 (L) >60 mL/min    Comment: (NOTE) The eGFR has been calculated using the CKD EPI equation. This calculation has not been validated in all clinical situations. eGFR's persistently <60 mL/min signify possible Chronic Kidney Disease.    Anion gap 8 5 - 15    Comment: Performed at [facility] [address] [place], [place] [address]  Protime-INR     Status: None   Collection Time: 07/31/18 11:25 PM  Result Value Ref Range   Prothrombin Time 14.8 11.4 - 15.2 seconds   INR 1.17     Comment: Performed at [facility], [place] [address] [place], [place] [address]  Type and screen [place]     Status: None   Collection Time: 07/31/18 11:46 PM  Result Value Ref Range   ABO/RH(D) B POS    Antibody Screen NEG    Sample Expiration      08/03/2018 Performed at [facility], [place] [address], [place], [place] [address]   ABO/Rh     Status: None   Collection Time: 07/31/18 11:46 PM  Result Value Ref Range   ABO/RH(D)      B POS Performed at [facility] [address] [place], [place] [address]   Surgical pcr screen     Status: None   Collection Time: 08/01/18  2:55 AM  Result Value Ref Range   MRSA, PCR NEGATIVE NEGATIVE   Staphylococcus aureus NEGATIVE NEGATIVE    Comment: (NOTE) The Xpert SA Assay (FDA approved for NASAL specimens in patients 41 years of age and older), is one component of a comprehensive surveillance program. It is not intended to diagnose infection nor to guide or monitor treatment. Performed at [facility], [place] [address], [place], [place] [address]   CBC     Status: Abnormal   Collection Time: 08/01/18  5:18 AM  Result Value Ref Range   WBC 11.3 (H) 4.0 - 10.5 K/uL   RBC 3.10 (L) 4.22 - 5.81 MIL/uL   Hemoglobin 9.9 (L) 13.0 - 17.0 g/dL   HCT 31.1 (L) 39.0 - 52.0 %   MCV 100.3 (H)  80.0 - 100.0 fL   MCH 31.9 26.0 - 34.0 pg    MCHC 31.8 30.0 - 36.0 g/dL   RDW 13.5 11.5 - 15.5 %   Platelets 167 150 - 400 K/uL   nRBC 0.0 0.0 - 0.2 %    Comment: Performed at [facility], [place] [address] [place], [place] [address]  Basic metabolic panel     Status: Abnormal   Collection Time: 08/01/18  5:18 AM  Result Value Ref Range   Sodium 138 135 - 145 mmol/L   Potassium 5.3 (H) 3.5 - 5.1 mmol/L   Chloride 100 98 - 111 mmol/L   CO2 29 22 - 32 mmol/L   Glucose, Bld 163 (H) 70 - 99 mg/dL   BUN 23 8 - 23 mg/dL   Creatinine, Ser 1.71 (H) 0.61 - 1.24 mg/dL   Calcium 8.7 (L) 8.9 - 10.3 mg/dL   GFR calc non Af Amer 36 (L) >60 mL/min   GFR calc Af Amer 41 (L) >60 mL/min    Comment: (NOTE) The eGFR has been calculated using the CKD EPI equation. This calculation has not been validated in all clinical situations. eGFR's persistently <60 mL/min signify possible Chronic Kidney Disease.    Anion gap 9 5 - 15    Comment: Performed at [facility] [address], [place], [place] [address]  Glucose, capillary     Status: Abnormal   Collection Time: 08/01/18  6:57 AM  Result Value Ref Range   Glucose-Capillary 128 (H) 70 - 99 mg/dL    Dg Chest Port 1 View  Result Date: 08/01/2018 CLINICAL DATA:  Preoperative for hip fracture. EXAM: PORTABLE CHEST 1 VIEW COMPARISON:  03/31/2015 FINDINGS: Postoperative changes in the mediastinum. Cardiac pacemaker. Shallow inspiration. Cardiac enlargement without vascular congestion. No edema or consolidation. There is chronic blunting of the left costophrenic angle probably due to pleural thickening. No pneumothorax. Calcification of the aorta. IMPRESSION: Cardiac enlargement. No evidence of active pulmonary disease. Chronic blunting of the left costophrenic angle probably due to pleural thickening. Electronically Signed   By: Lucienne Capers M.D.   On: 08/01/2018 00:20   Dg Hip Unilat  With Pelvis 2-3 Views Right  Result Date: 07/31/2018 CLINICAL DATA:  Fall with right hip pain  EXAM: DG HIP (WITH OR WITHOUT PELVIS) 2-3V RIGHT COMPARISON:  None. FINDINGS: Nondisplaced intertrochanteric fracture of the proximal right femur. Femoroacetabular joint is approximated. No pelvic fracture or diastasis. Normal left hip. Extensive vascular calcification. IMPRESSION: Nondisplaced intertrochanteric fracture of the right femur. Electronically Signed   By: Ulyses Jarred M.D.   On: 07/31/2018 23:02    Review of Systems  Constitutional: Negative for weight loss.  HENT: Negative for ear discharge, ear pain, hearing loss and tinnitus.   Eyes: Negative for blurred vision, double vision, photophobia and pain.  Respiratory: Negative for cough, sputum production and shortness of breath.   Cardiovascular: Negative for chest pain.  Gastrointestinal: Negative for abdominal pain, nausea and vomiting.  Genitourinary: Negative for dysuria, flank pain, frequency and urgency.  Musculoskeletal: Positive for joint pain (Right hip). Negative for back pain, falls, myalgias and neck pain.  Neurological: Negative for dizziness, tingling, sensory change, focal weakness, loss of consciousness and headaches.  Endo/Heme/Allergies: Does not bruise/bleed easily.  Psychiatric/Behavioral: Negative for depression, memory loss and substance abuse. The patient is not nervous/anxious.    Blood pressure (!) 127/55, pulse 60, temperature 98.2 F (36.8 C), temperature source Oral, resp. rate 18, height '5\' 6"'  (1.676  m), weight 70.8 kg, SpO2 97 %. Physical Exam  Constitutional: He appears well-developed and well-nourished. No distress.  HENT:  Head: Normocephalic and atraumatic.  Eyes: Conjunctivae are normal. Left eye exhibits no discharge. No scleral icterus.  Dressing OD  Neck: Normal range of motion.  Cardiovascular: Normal rate and regular rhythm.  Respiratory: Effort normal. No respiratory distress.  Musculoskeletal:  RLE No traumatic wounds, ecchymosis, or rash  Mild TTP hip  No knee or ankle  effusion  Knee stable to varus/ valgus and anterior/posterior stress  Sens DPN, SPN, TN intact  Motor EHL, ext, flex, evers 4/5  DP 2+, PT 1+, No significant edema  Neurological: He is alert.  Skin: Skin is warm and dry. He is not diaphoretic.  Psychiatric: He has a normal mood and affect. His behavior is normal.    Assessment/Plan: Right intertroch hip fx -- Plan IMN this morning by Dr. Doreatha Martin. NPO until then. Multiple medical problems including ischemic cardiomyopathy status post ICD placement and on amiodarone, diabetes mellitus type 2, hypertension, hypothyroidism and hyperlipidemia -- per primary team    Lisette Abu, PA-C Orthopedic Surgery [phone] 08/01/2018, 8:33 AM

## 2018-08-01 NOTE — Anesthesia Preprocedure Evaluation (Addendum)
Anesthesia Evaluation  Patient identified by MRN, date of birth, ID band Patient awake    Reviewed: Allergy & Precautions, NPO status , Patient's Chart, lab work & pertinent test results  History of Anesthesia Complications Negative for: history of anesthetic complications  Airway Mallampati: III  TM Distance: >3 FB Neck ROM: Full    Dental  (+) Upper Dentures, Partial Lower, Dental Advisory Given   Pulmonary former smoker,    Pulmonary exam normal breath sounds clear to auscultation       Cardiovascular hypertension, Pt. on home beta blockers and Pt. on medications + CAD, + Past MI, + CABG and +CHF  Normal cardiovascular exam+ Cardiac Defibrillator  Rhythm:Regular Rate:Normal   '19 Carotid US - 40-59% right ICAS, 1-39% left ICAS  '15 TTE - LV Apical and septal dyskinesis. Distal anterior wall and inferior wall hypokinesis. The cavity size was moderately dilated. Mild LVH. EF 25% to 30%. Mild MR, moderately dilated LA.     Neuro/Psych PSYCHIATRIC DISORDERS Depression  Neuromuscular disease (Diabetic neuropathy)    GI/Hepatic negative GI ROS, Neg liver ROS,   Endo/Other  diabetes, Type 2, Oral Hypoglycemic AgentsHypothyroidism   Renal/GU negative Renal ROS  negative genitourinary   Musculoskeletal negative musculoskeletal ROS (+)   Abdominal   Peds  Hematology  (+) anemia ,   Anesthesia Other Findings   Reproductive/Obstetrics                           Anesthesia Physical Anesthesia Plan  ASA: III  Anesthesia Plan: General   Post-op Pain Management:    Induction: Intravenous  PONV Risk Score and Plan: 3 and Treatment may vary due to age or medical condition and Ondansetron  Airway Management Planned: LMA  Additional Equipment: None  Intra-op Plan:   Post-operative Plan: Extubation in OR  Informed Consent: I have reviewed the patients History and Physical, chart, labs and  discussed the procedure including the risks, benefits and alternatives for the proposed anesthesia with the patient or authorized representative who has indicated his/her understanding and acceptance.   Dental advisory given  Plan Discussed with: CRNA and Anesthesiologist  Anesthesia Plan Comments:        Anesthesia Quick Evaluation

## 2018-08-01 NOTE — Anesthesia Procedure Notes (Signed)
Procedure Name: LMA Insertion Date/Time: 08/01/2018 11:52 AM Performed by: Colin Benton, CRNA Pre-anesthesia Checklist: Patient identified, Emergency Drugs available, Suction available and Patient being monitored Patient Re-evaluated:Patient Re-evaluated prior to induction Oxygen Delivery Method: Circle system utilized Preoxygenation: Pre-oxygenation with 100% oxygen Induction Type: IV induction Ventilation: Mask ventilation without difficulty LMA: LMA inserted LMA Size: 4.0 Placement Confirmation: positive ETCO2 Tube secured with: Tape Dental Injury: Teeth and Oropharynx as per pre-operative assessment

## 2018-08-01 NOTE — Consult Note (Addendum)
Cardiology Consultation:   Patient ID: Gregory [name] MRN: [id number]; DOB: May 19, 1933  Admit date: 07/31/2018 Date of Consult: 08/01/2018  Primary Care Provider: Christain Sacramento, MD Primary Cardiologist: Johnsie Cancel Primary Electrophysiologist:  Lovena Le   Patient Profile:   Gregory [name] is a 84 y.o. male with a hx of severe ischemic CMP and ICD who is being seen today for the evaluation of preoperative risk for hip fracture surgery at the request of Dr. Lyman Speller.  History of Present Illness:   Gregory [name] has well compensated chronic systolic HF, NYHA functional class II, without recent acute ischemic events, hospitalization for CHF or adjustment of outpatient diuretic doses. He sustained a mechanical fall, without syncope, and has R hip fracture and will require ORIF. He also has DM and is on amiodarone for a history of VT  Most recent EF 25-30% by echo 2015. He had CABG in 2006, after he had a large anterior MI. He has not had angina since. He had ICD generator change in 2015 (Medtronic Evera single chamber). Previous falls have occurred, without corresponding arrhythmia detected on his ICD interrogation. The device was last checked remotely on Sept 30 and was functioning normally. He does not require pacing. No recent VT recorded and has never had tachy therapies delivered since gen change. Optivol was at baseline - no evidence of fluid overload. Actually noted improved activity levels in the last couple of weeks.  Past Medical History:  Diagnosis Date  . Abnormality of gait 09/19/2016  . Allergy   . Cataract   . CHF (congestive heart failure) ([place])   . Colon polyps   . Diabetes mellitus   . Diabetic neuropathy ([place])   . Diverticulosis   . Dyslipidemia   . Essential hypertension   . Glaucoma   . Hyperlipidemia   . Hypothyroidism   . ICD (implantable cardiac defibrillator) in place 11-2004  . Internal hemorrhoid   . Ischemic cardiomyopathy   . Ischemic heart disease   .  Myocardial infarction [facility]) 2006   LARGE ANTERIOR WALL    Past Surgical History:  Procedure Laterality Date  . CARDIAC DEFIBRILLATOR PLACEMENT    . CATARACT EXTRACTION     left  . CHOLECYSTECTOMY    . CHOLECYSTECTOMY, LAPAROSCOPIC    . COLONOSCOPY N/A 12/01/2012   Procedure: COLONOSCOPY;  Surgeon: Irene Shipper, MD;  Location: [facility] ENDOSCOPY;  Service: Endoscopy;  Laterality: N/A;  . CORONARY ARTERY BYPASS GRAFT  12/02/04  . ESOPHAGOGASTRODUODENOSCOPY N/A 12/01/2012   Procedure: ESOPHAGOGASTRODUODENOSCOPY (EGD);  Surgeon: Irene Shipper, MD;  Location: [facility];  Service: Endoscopy;  Laterality: N/A;  . 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 IMPLANTABLE CARDIOVERTER DEFIBRILLATOR (ICD) GENERATOR CHANGE N/A 03/11/2014   Procedure: ICD GENERATOR CHANGE;  Surgeon: Evans Lance, MD;  Location: [facility] CATH LAB;  Service: Cardiovascular;  Laterality: N/A;  . SHOULDER SURGERY     RIGHT  . TONSILLECTOMY       Home Medications:  Prior to Admission medications   Medication Sig Start Date End Date Taking? Authorizing Provider  acetaminophen (TYLENOL) 500 MG tablet Take 500 mg by mouth every 6 (six) hours as needed. For pain    [provider]  amiodarone (PACERONE) 200 MG tablet TAKE 1/2 TABLET BY MOUTH EVERY MORNING 06/26/18   Josue Hector, MD  aspirin EC 325 MG tablet Take 325 mg by mouth every morning.    [provider]  atorvastatin (LIPITOR) 20 MG tablet  Take 1 tablet (20 mg total) by mouth daily. 07/23/16   Josue Hector, MD  calcium carbonate (TUMS EX) 750 MG chewable tablet Chew 1 tablet by mouth daily.     [provider]  carvedilol (COREG) 12.5 MG tablet TAKE 1 TABLET (12.5 MG TOTAL) BY MOUTH 2 (TWO) TIMES DAILY WITH A MEAL. 01/17/18   Josue Hector, MD  cetirizine (ZYRTEC) 10 MG tablet Take 10 mg by mouth daily.    [provider]  cholecalciferol (VITAMIN D) 1000 UNITS tablet Take 1,000 Units by mouth daily.    [provider]  diphenhydrAMINE (BENADRYL) 25 mg capsule Take 25 mg by mouth daily as needed for allergies.     [provider]  dorzolamide-timolol (COSOPT) 22.3-6.8 MG/ML ophthalmic solution Place 1 drop into the left eye daily.    [provider]  ferrous sulfate 325 (65 FE) MG tablet Take 325 mg by mouth daily with breakfast.    [provider]  furosemide (LASIX) 40 MG tablet Take 1 tablet (40 mg total) by mouth daily. 11/30/14   Darlin Coco, MD  gabapentin (NEURONTIN) 300 MG capsule Take 300 mg by mouth 3 (three) times daily.     [provider]  Inulin (METAMUCIL CLEAR & NATURAL) POWD Take 15 mLs by mouth daily as needed (fiber).  05/18/13   Darlin Coco, MD  latanoprost (XALATAN) 0.005 % ophthalmic solution Place 1 drop into the left eye at bedtime.    [provider]  metFORMIN (GLUCOPHAGE) 500 MG tablet Take 500 mg by mouth 2 (two) times daily.  09/07/12   [provider]  Multiple Vitamins-Minerals (CENTRUM SILVER PO) Take 1 tablet by mouth daily.     [provider]  nitroGLYCERIN (NITROSTAT) 0.4 MG SL tablet Place 1 tablet (0.4 mg total) under the tongue every 5 (five) minutes as needed for chest pain. 05/04/16   Evans Lance, MD  promethazine (PHENERGAN) 25 MG tablet Take 25 mg by mouth every 4 (four) hours as needed. nausea 10/26/15   [provider]  ramipril (ALTACE) 1.25 MG capsule TAKE 1 CAPSULE BY MOUTH EVERY DAY Patient taking differently: Take 1.25 mg by mouth daily.  09/09/17   Josue Hector, MD  SYNTHROID 137 MCG tablet Take 137 mcg by mouth daily. 10/15/15   [provider]  tamsulosin (FLOMAX) 0.4 MG CAPS capsule Take 0.4 mg by mouth daily after breakfast.  02/02/14   [provider]  temazepam (RESTORIL) 30 MG capsule Take 30 mg by mouth at bedtime as needed for sleep.     [provider]    Inpatient Medications: Scheduled Meds: . amiodarone  100 mg Oral q  morning - 10a  . atorvastatin  20 mg Oral Daily  . calcium carbonate  1 tablet Oral Daily  . carvedilol  12.5 mg Oral BID WC  . dorzolamide-timolol  1 drop Left Eye Daily  . ferrous sulfate  325 mg Oral Q breakfast  . gabapentin  300 mg Oral TID  . insulin aspart  0-9 Units Subcutaneous Q4H  . latanoprost  1 drop Left Eye QHS  . levothyroxine  137 mcg Oral Daily  . tamsulosin  0.4 mg Oral QPC breakfast   Continuous Infusions:  PRN Meds: morphine injection, nitroGLYCERIN, temazepam  Allergies:   No Known Allergies  Social History:   Social History   Socioeconomic History  . Marital status: Married    Spouse name: Not on file  . Number of  children: 1  . Years of education: 7  . Highest education level: Not on file  Occupational History  . Occupation: retired  Scientific laboratory technician  . Financial resource strain: Not on file  . Food insecurity:    Worry: Not on file    Inability: Not on file  . Transportation needs:    Medical: Not on file    Non-medical: Not on file  Tobacco Use  . Smoking status: Former Smoker    Types: Cigarettes    Last attempt to quit: 10/22/2004    Years since quitting: 13.7  . Smokeless tobacco: Never Used  . Tobacco comment: quit smoking pipe/cigar in 2006  Substance and Sexual Activity  . Alcohol use: No    Alcohol/week: 0.0 standard drinks  . Drug use: No  . Sexual activity: Not on file  Lifestyle  . Physical activity:    Days per week: Not on file    Minutes per session: Not on file  . Stress: Not on file  Relationships  . Social connections:    Talks on phone: Not on file    Gets together: Not on file    Attends religious service: Not on file    Active member of club or organization: Not on file    Attends meetings of clubs or organizations: Not on file    Relationship status: Not on file  . Intimate partner violence:    Fear of current or ex partner: Not on file    Emotionally abused: Not on file    Physically abused: Not on file     Forced sexual activity: Not on file  Other Topics Concern  . Not on file  Social History Narrative   Lives at home w/ his wife    Left-handed   Caffeine: 1 cup decaf coffee in the morning    Family History:    Family History  Problem Relation Age of Onset  . Diabetes Mother   . Prostate cancer Father   . Cancer Daughter        ? unknown, in stomach  . Diabetes Unknown   . Colon cancer Neg Hx   . Esophageal cancer Neg Hx   . Rectal cancer Neg Hx   . Stomach cancer Neg Hx      ROS:  Please see the history of present illness.   All other ROS reviewed and negative.     Physical Exam/Data:   Vitals:   08/01/18 0030 08/01/18 0100 08/01/18 0203 08/01/18 0614  BP: (!) 130/55 130/63 (!) 142/62 (!) 127/55  Pulse: (!) 56 64 62 60  Resp: 17 18 18 18   Temp:   97.9 F (36.6 C) 98.2 F (36.8 C)  TempSrc:   Oral Oral  SpO2: 100% 100% 93% 97%  Weight:      Height:        Intake/Output Summary (Last 24 hours) at 08/01/2018 0948 Last data filed at 08/01/2018 0400 Gross per 24 hour  Intake 0 ml  Output -  Net 0 ml   Filed Weights   07/31/18 2201  Weight: 70.8 kg   Body mass index is 25.18 kg/m.  General:  Well nourished, well developed, in no acute distress, lying fully supine HEENT: patch R eye ("birth defect") Lymph: no adenopathy Neck: no JVD, but cannon a waves seen due to very long AV delay Endocrine:  No thryomegaly Vascular: No carotid bruits; FA pulses 2+ bilaterally without bruits  Cardiac:  normal S1, widely split S2; RRR;  no murmur; healthy ICD site Lungs:  clear to auscultation bilaterally, no wheezing, rhonchi or rales  Abd: soft, nontender, no hepatomegaly  Ext: no edema Musculoskeletal:  No deformities, BUE and BLE strength normal and equal Skin: warm and dry  Neuro:  CNs 2-12 intact, no focal abnormalities noted Psych:  Normal affect   EKG:  The EKG was personally reviewed and demonstrates:  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 Distal anterior wall and inferior wall hypokinesis The cavity size was moderately dilated. Wall thickness was increased in a pattern of mild LVH. Systolic function was severely reduced. The estimated ejection fraction was in the range of 25% to 30%. - Mitral valve: Mild regurgitation. - Left atrium: The atrium was moderately dilated. - Atrial septum: No defect or patent foramen ovale was identified. - Impressions: Filling pressures very high with E/E; over 25 and diastolic MR Impressions:  - Filling pressures very high with E/E; over 25 and diastolic MR  Device check 07/21/2018 reviewed in detail.  Laboratory Data:  Chemistry Recent Labs  Lab 07/31/18 2318 08/01/18 0518  NA 136 138  K 4.8 5.3*  CL 100 100  CO2 28 29  GLUCOSE 155* 163*  BUN 23 23  CREATININE 1.70* 1.71*  CALCIUM 8.8* 8.7*  GFRNONAA 36* 36*  GFRAA 41* 41*  ANIONGAP 8 9    No results for input(s): PROT, ALBUMIN, AST, ALT, ALKPHOS, BILITOT in the last 168 hours. Hematology Recent Labs  Lab 07/31/18 2318 08/01/18 0518  WBC 10.7* 11.3*  RBC 3.30* 3.10*  HGB 10.8* 9.9*  HCT 33.6* 31.1*  MCV 101.8* 100.3*  MCH 32.7 31.9  MCHC 32.1 31.8  RDW 13.4 13.5  PLT 175 167   Cardiac EnzymesNo results for input(s): TROPONINI in the last 168 hours. No results for input(s): TROPIPOC in the last 168 hours.  BNPNo results for input(s): BNP, PROBNP in the last 168 hours.  DDimer No results for input(s): DDIMER in the last 168 hours.  Radiology/Studies:  Dg Chest Port 1 View  Result Date: 08/01/2018 CLINICAL DATA:  Preoperative for hip fracture. EXAM: PORTABLE CHEST 1 VIEW COMPARISON:  03/31/2015 FINDINGS: Postoperative changes in the mediastinum. Cardiac pacemaker. Shallow inspiration.  Cardiac enlargement without vascular congestion. No edema or consolidation. There is chronic blunting of the left costophrenic angle probably due to pleural thickening. No pneumothorax. Calcification of the aorta. IMPRESSION: Cardiac enlargement. No evidence of active pulmonary disease. Chronic blunting of the left costophrenic angle probably due to pleural thickening. Electronically Signed   By: Lucienne Capers M.D.   On: 08/01/2018 00:20   Dg Hip Unilat  With Pelvis 2-3 Views Right  Result Date: 07/31/2018 CLINICAL DATA:  Fall with right hip pain EXAM: DG HIP (WITH OR WITHOUT PELVIS) 2-3V RIGHT COMPARISON:  None. FINDINGS: Nondisplaced intertrochanteric fracture of the proximal right femur. Femoroacetabular joint is approximated. No pelvic fracture or diastasis. Normal left hip. Extensive vascular calcification. IMPRESSION: Nondisplaced intertrochanteric fracture of the right femur. Electronically Signed   By: Ulyses Jarred M.D.   On: 07/31/2018 23:02    Assessment and Plan:   1. Preop CV eval: despite serious chronic CV problems, he is currently well compensated without recent ischemic or HF events. The risk of surgery is lower than the risk of complications with unrepaired hip  fracture. No change in meds is recommended and no additional CV evaluation is planned preop. Beta blockers should not be interrupted in the periop period. The planned procedure should not interfere with normal device function. 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 Avoid any additional negative chronotropic agents. His device will provide backup VVI pacing at 40 bpm. 3. CHF: appears euvolemic. Not receiving furosemide now, but will probably need to restart furosemide 40 mg daily postop. Avoid excessive IV fluids. 4. CAD: no angina in years. 5. ICD: normal device function at check less than 2 weeks ago.  [facility] will sign off.   Medication Recommendations:   Continue all cardiac home meds postop Other recommendations (labs, testing, etc):  Please call weekend team if the patient develops bradycardia or fluid overload postop. Follow up as an outpatient:  10/30 with Dr. Johnsie Cancel  For questions or updates, please contact [place]ia Please consult [email or web address] for contact info under     Signed, Sanda Klein, MD  08/01/2018 9:48 AM

## 2018-08-01 NOTE — Anesthesia Postprocedure Evaluation (Signed)
Anesthesia Post Note  Patient: Gregory [name]  Procedure(s) Performed: INTRAMEDULLARY (IM) NAIL INTERTROCHANTRIC right hip (Right )     Patient location during evaluation: PACU Anesthesia Type: General Level of consciousness: awake and alert Pain management: pain level controlled Vital Signs Assessment: post-procedure vital signs reviewed and stable Respiratory status: spontaneous breathing, nonlabored ventilation, respiratory function stable and patient connected to nasal cannula oxygen Cardiovascular status: blood pressure returned to baseline and stable Postop Assessment: no apparent nausea or vomiting Anesthetic complications: no    Last Vitals:  Vitals:   08/01/18 1405 08/01/18 1422  BP: (!) 131/52 123/77  Pulse: 60 (!) 56  Resp: 15 16  Temp: 36.8 C 36.5 C  SpO2: 98% 100%    Last Pain:  Vitals:   08/01/18 1422  TempSrc: Oral  PainSc:                  [name]

## 2018-08-01 NOTE — Progress Notes (Signed)
Pt arrived from PACU.  Pt's orders were realesed.  Pt asked if he would like anything to eat.  Pt declined to order food because he didn't have Fixodent.  Pt was offered soup and applesauce.  Pt stated he didn't want any soup.  Pt ate applesauce.

## 2018-08-02 LAB — BASIC METABOLIC PANEL
Anion gap: 5 (ref 5–15)
BUN: 20 mg/dL (ref 8–23)
CHLORIDE: 100 mmol/L (ref 98–111)
CO2: 29 mmol/L (ref 22–32)
Calcium: 8.3 mg/dL — ABNORMAL LOW (ref 8.9–10.3)
Creatinine, Ser: 1.55 mg/dL — ABNORMAL HIGH (ref 0.61–1.24)
GFR calc Af Amer: 46 mL/min — ABNORMAL LOW (ref >60)
GFR calc non Af Amer: 40 mL/min — ABNORMAL LOW (ref >60)
GLUCOSE: 123 mg/dL — AB (ref 70–99)
Potassium: 4.7 mmol/L (ref 3.5–5.1)
Sodium: 134 mmol/L — ABNORMAL LOW (ref 135–145)

## 2018-08-02 LAB — GLUCOSE, CAPILLARY
GLUCOSE-CAPILLARY: 103 mg/dL — AB (ref 70–99)
Glucose-Capillary: 122 mg/dL — ABNORMAL HIGH (ref 70–99)
Glucose-Capillary: 121 mg/dL — ABNORMAL HIGH (ref 70–99)
Glucose-Capillary: 112 mg/dL — ABNORMAL HIGH (ref 70–99)

## 2018-08-02 LAB — CBC
HEMATOCRIT: 27 % — AB (ref 39.0–52.0)
HEMOGLOBIN: 8.7 g/dL — AB (ref 13.0–17.0)
MCH: 32.8 pg (ref 26.0–34.0)
MCHC: 32.2 g/dL (ref 30.0–36.0)
MCV: 101.9 fL — AB (ref 80.0–100.0)
Platelets: 153 10*3/uL (ref 150–400)
RBC: 2.65 MIL/uL — ABNORMAL LOW (ref 4.22–5.81)
RDW: 13.5 % (ref 11.5–15.5)
WBC: 9.5 10*3/uL (ref 4.0–10.5)
nRBC: 0 % (ref 0.0–0.2)

## 2018-08-02 MED ORDER — HYDROCODONE-ACETAMINOPHEN 5-325 MG PO TABS
1.0000 | ORAL_TABLET | ORAL | Status: DC | PRN
  Administered 2018-08-03 – 2018-08-06 (×4): 1 via ORAL
  Filled 2018-08-02 (×5): qty 1

## 2018-08-02 MED ORDER — FUROSEMIDE 40 MG PO TABS
40.0000 mg | ORAL_TABLET | Freq: Every day | ORAL | Status: DC
  Administered 2018-08-02 – 2018-08-04 (×3): 40 mg via ORAL
  Filled 2018-08-02 (×3): qty 1

## 2018-08-02 MED ORDER — TAMSULOSIN HCL 0.4 MG PO CAPS
0.4000 mg | ORAL_CAPSULE | Freq: Two times a day (BID) | ORAL | Status: DC
  Administered 2018-08-02 – 2018-08-10 (×16): 0.4 mg via ORAL
  Filled 2018-08-02 (×16): qty 1

## 2018-08-02 NOTE — Evaluation (Signed)
Physical Therapy Evaluation Patient Details Name: Gregory [name] MRN: [id number] DOB: 1933/07/11 Today's Date: 08/02/2018   History of Present Illness   Gregory [name] is a 84 y.o. male with history of ischemic cardiomyopathy status post ICD placement and on amiodarone, diabetes mellitus type 2, hypertension, hypothyroidism and hyperlipidemia was brought from the home after patient had a fall.  pt with right IM  nail.  Pt WBAT  Clinical Impression  Pt tolerated OOB with +2 assist.  Pt walked 3 steps with max cues.  pts O2 sats on 2L 97%, on RA at rest 94% and on RA after activity 87%.  Pt will benefit from continued therapy and education in returning to active lifestyle and reducing fall risk    Follow Up Recommendations SNF    Equipment Recommendations  None recommended by PT    Recommendations for Other Services       Precautions / Restrictions Precautions Precautions: Fall Restrictions Weight Bearing Restrictions: Yes RLE Weight Bearing: Weight bearing as tolerated      Mobility  Bed Mobility Overal bed mobility: Needs Assistance Bed Mobility: Supine to Sit     Supine to sit: +2 for physical assistance;Max assist     General bed mobility comments: pt did not want to move right leg to get up - too sore.    Transfers Overall transfer level: Needs assistance Equipment used: Rolling walker (2 wheeled) Transfers: Sit to/from Stand Sit to Stand: +2 physical assistance;Mod assist         General transfer comment: pt needed cues for technque - and mod assist to push to st anding and cues to move hands to RW  Ambulation/Gait Ambulation/Gait assistance: +2 physical assistance Gait Distance (Feet): 2 Feet Assistive device: Rolling walker (2 wheeled) Gait Pattern/deviations: Step-to pattern;Trunk flexed;Antalgic;Decreased stance time - right     General Gait Details: pt walked 3 steps forward with RW and max cues for technque and chair pulled up behind pt to sit  down and rest.  Stairs            Wheelchair Mobility    Modified Rankin (Stroke Patients Only)       Balance Overall balance assessment: Needs assistance;History of Falls             Standing balance comment: pt with flexed posture and relying on RW for support                             Pertinent Vitals/Pain Pain Assessment: 0-10 Pain Score: (with initial sitting EOB pt said hurting 10/10.  otherwise hurting 6/10) Pain Location: right hip Pain Descriptors / Indicators: Aching;Burning;Grimacing;Guarding;Sharp Pain Intervention(s): Limited activity within patient's tolerance;Monitored during session;Ice applied;Repositioned    Home Living Family/patient expects to be discharged to:: Skilled nursing facility Living Arrangements: Alone               Additional Comments: pt was living alone but wife has been in [place] since september and pt hoping to go there for therapy    Prior Function Level of Independence: Independent with assistive device(s)         Comments: pt lived alone and used a cane.  pt reports he has history of multiple falls - both inside and outside     Hand Dominance        Extremity/Trunk Assessment        Lower Extremity Assessment Lower Extremity Assessment: RLE deficits/detail RLE: Unable to fully  assess due to pain    Cervical / Trunk Assessment Cervical / Trunk Assessment: Kyphotic  Communication   Communication: No difficulties(slow to respond today (?pain meds))  Cognition Arousal/Alertness: Awake/alert;Suspect due to medications;Lethargic Behavior During Therapy: WFL for tasks assessed/performed;Flat affect Overall Cognitive Status: Impaired/Different from baseline Area of Impairment: Following commands                       Following Commands: Follows one step commands inconsistently              General Comments      Exercises Total Joint Exercises Ankle Circles/Pumps:  AROM;10 reps;Both   Assessment/Plan    PT Assessment Patient needs continued PT services  PT Problem List Decreased strength;Decreased mobility;Decreased safety awareness;Decreased knowledge of precautions;Decreased activity tolerance;Decreased balance;Decreased knowledge of use of DME;Pain       PT Treatment Interventions DME instruction;Therapeutic activities;Gait training;Therapeutic exercise;Patient/family education;Functional mobility training    PT Goals (Current goals can be found in the Care Plan section)  Acute Rehab PT Goals Patient Stated Goal: to stop hurting PT Goal Formulation: With patient/family Time For Goal Achievement: 08/09/18 Potential to Achieve Goals: Good    Frequency Min 4X/week   Barriers to discharge Decreased caregiver support      Co-evaluation               AM-PAC PT "6 Clicks" Daily Activity  Outcome Measure Difficulty turning over in bed (including adjusting bedclothes, sheets and blankets)?: Unable Difficulty moving from lying on back to sitting on the side of the bed? : Unable Difficulty sitting down on and standing up from a chair with arms (e.g., wheelchair, bedside commode, etc,.)?: Unable Help needed moving to and from a bed to chair (including a wheelchair)?: A Lot Help needed walking in hospital room?: A Lot Help needed climbing 3-5 steps with a railing? : Total 6 Click Score: 8    End of Session Equipment Utilized During Treatment: Gait belt Activity Tolerance: Patient limited by pain;Patient tolerated treatment well Patient left: in chair;with chair alarm set;with family/visitor present;with call bell/phone within reach Nurse Communication: Mobility status;Precautions PT Visit Diagnosis: Other abnormalities of gait and mobility (R26.89);History of falling (Z91.81);Difficulty in walking, not elsewhere classified (R26.2);Pain Pain - Right/Left: Right Pain - part of body: Hip    Time: 1914-7829 PT Time Calculation (min)  (ACUTE ONLY): 40 min   Charges:   PT Evaluation $PT Eval Low Complexity: 1 Low          08/02/2018   Rande Lawman, PT   Loyal Buba 08/02/2018, 11:13 AM

## 2018-08-02 NOTE — Evaluation (Signed)
Occupational Therapy Evaluation Patient Details Name: Gregory [name] MRN: [id number] DOB: 1933-08-14 Today's Date: 08/02/2018    History of Present Illness  Gregory [name] is a 84 y.o. male with history of ischemic cardiomyopathy status post ICD placement and on amiodarone, diabetes mellitus type 2, hypertension, hypothyroidism and hyperlipidemia was brought from the home after patient had a fall.  pt with right IM  nail.  Pt WBAT   Clinical Impression   PTA, pt was living with his wife and was independent with ADLs; pts' wife currently at rehab and family assisting with IADLs. Currently, pt requires Mod-Max A for LB ADLs and Mod A +2 for functional mobility using RW. Pt presenting with decreased strength, balance, and activity tolerance due to pain. Pt would benefit from further acute OT to facilitate safe dc. Recommend dc to SNF for further OT to optimize safety, independence with ADLs, and return to PLOF.       Follow Up Recommendations  SNF;Supervision/Assistance - 24 hour    Equipment Recommendations  Other (comment)(Defer to next venue)    Recommendations for Other Services PT consult     Precautions / Restrictions Precautions Precautions: Fall Restrictions Weight Bearing Restrictions: Yes RLE Weight Bearing: Weight bearing as tolerated      Mobility Bed Mobility Overal bed mobility: Needs Assistance Bed Mobility: Supine to Sit     Supine to sit: Total assist;+2 for physical assistance     General bed mobility comments: pt did not want to move right leg to get up - too sore.  Requiring total a for managing BLEs and trunk due to pain  Transfers Overall transfer level: Needs assistance Equipment used: Rolling walker (2 wheeled) Transfers: Sit to/from Stand Sit to Stand: +2 physical assistance;Mod assist         General transfer comment: pt needed cues for technque - and mod assist to push to st anding and cues to move hands to RW    Balance Overall  balance assessment: Needs assistance;History of Falls Sitting-balance support: Feet supported;No upper extremity supported Sitting balance-Leahy Scale: Fair     Standing balance support: Bilateral upper extremity supported;During functional activity Standing balance-Leahy Scale: Poor Standing balance comment: pt with flexed posture and relying on RW for support                           ADL either performed or assessed with clinical judgement   ADL Overall ADL's : Needs assistance/impaired Eating/Feeding: Set up;Supervision/ safety;Sitting   Grooming: Set up;Supervision/safety;Sitting   Upper Body Bathing: Set up;Supervision/ safety;Sitting   Lower Body Bathing: Moderate assistance;Sit to/from stand;+2 for physical assistance Lower Body Bathing Details (indicate cue type and reason): Mod A +2 for sit<>stand. Pt able to perform parts of LB bathing while seated. Upper Body Dressing : Set up;Supervision/safety;Sitting   Lower Body Dressing: Maximal assistance;Sit to/from stand Lower Body Dressing Details (indicate cue type and reason): Max A for managing LB clothing Toilet Transfer: Moderate assistance;+2 for physical assistance;Ambulation;RW;Cueing for sequencing(Simulated to recliner)           Functional mobility during ADLs: Moderate assistance;+2 for physical assistance;Rolling walker General ADL Comments: Pt presenting with decreased balance and strength. 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; wears patch)       Perception     Praxis      Pertinent Vitals/Pain Pain Assessment: 0-10 Pain Score: 9  Pain Location: right  hip Pain Descriptors / Indicators: Aching;Burning;Grimacing;Guarding;Sharp Pain Intervention(s): Monitored during session;Limited activity within patient's tolerance;Repositioned     Hand Dominance     Extremity/Trunk Assessment Upper Extremity Assessment Upper Extremity  Assessment: Generalized weakness   Lower Extremity Assessment Lower Extremity Assessment: Defer to PT evaluation RLE: Unable to fully assess due to pain   Cervical / Trunk Assessment Cervical / Trunk Assessment: Kyphotic   Communication Communication Communication: No difficulties(slow to respond today (?pain meds))   Cognition Arousal/Alertness: Awake/alert;Suspect due to medications;Lethargic Behavior During Therapy: WFL for tasks assessed/performed;Flat affect Overall Cognitive Status: Impaired/Different from baseline Area of Impairment: Following commands;Problem solving                       Following Commands: Follows one step commands inconsistently     Problem Solving: Requires verbal cues;Slow processing General Comments: Requiring increased cues and time for processing   General Comments  Niece present throughout session    Exercises    Shoulder Instructions      Home Living Family/patient expects to be discharged to:: Skilled nursing facility Living Arrangements: Alone                               Additional Comments: pt was living alone but wife has been in [place] since september and pt hoping to go there for therapy      Prior Functioning/Environment Level of Independence: Independent with assistive device(s)        Comments: pt lived alone and used a cane.  pt reports he has history of multiple falls - both inside and outside. Independent with ADLs and family performs IADLs.        OT Problem List: Decreased strength;Decreased range of motion;Decreased activity tolerance;Impaired balance (sitting and/or standing);Decreased safety awareness;Decreased knowledge of use of DME or AE;Decreased knowledge of precautions;Pain      OT Treatment/Interventions: Self-care/ADL training;Therapeutic exercise;Energy conservation;DME and/or AE instruction;Therapeutic activities;Patient/family education    OT Goals(Current goals can be  found in the care plan section) Acute Rehab OT Goals Patient Stated Goal: to stop hurting OT Goal Formulation: With patient Time For Goal Achievement: 08/16/18 Potential to Achieve Goals: Good ADL Goals Pt Will Perform Grooming: with min guard assist;standing Pt Will Perform Lower Body Dressing: with min guard assist;with adaptive equipment;sit to/from stand Pt Will Transfer to Toilet: with min guard assist;ambulating;bedside commode Pt Will Perform Toileting - Clothing Manipulation and hygiene: with min guard assist;sit to/from stand  OT Frequency: Min 2X/week   Barriers to D/C:            Co-evaluation PT/OT/SLP Co-Evaluation/Treatment: Yes Reason for Co-Treatment: For patient/therapist safety;To address functional/ADL transfers   OT goals addressed during session: ADL's and self-care      AM-PAC PT "6 Clicks" Daily Activity     Outcome Measure Help from another person eating meals?: None Help from another person taking care of personal grooming?: A Little Help from another person toileting, which includes using toliet, bedpan, or urinal?: A Lot Help from another person bathing (including washing, rinsing, drying)?: A Lot Help from another person to put on and taking off regular upper body clothing?: A Little Help from another person to put on and taking off regular lower body clothing?: A Lot 6 Click Score: 16   End of Session Equipment Utilized During Treatment: Gait belt;Rolling walker Nurse Communication: Mobility status;Precautions;Weight bearing status  Activity Tolerance: Patient tolerated treatment well;Patient limited by pain  Patient left: in chair;with call bell/phone within reach;with chair alarm set;with family/visitor present  OT Visit Diagnosis: Unsteadiness on feet (R26.81);Other abnormalities of gait and mobility (R26.89);Muscle weakness (generalized) (M62.81);History of falling (Z91.81);Pain Pain - Right/Left: Right Pain - part of body: Leg                 Time: 1281-1886 OT Time Calculation (min): 25 min Charges:  OT General Charges $OT Visit: 1 Visit OT Evaluation $OT Eval Moderate Complexity: [place], OTR/L Acute Rehab Pager: [phone] Office: [place] 08/02/2018, 1:40 PM

## 2018-08-02 NOTE — Plan of Care (Signed)

## 2018-08-02 NOTE — Progress Notes (Signed)
PROGRESS NOTE    Gregory [name]  YSA:[id number] DOB: Sep 06, 1933 DOA: 07/31/2018 PCP: Christain Sacramento, MD      Brief Narrative:  [name] is a 84 y.o. M with sCHF, EF 25%, ICD in place, Hypothyroidism, CKD III baseline Cr 1.5-2.0,  BPH, HTN, DM, and macrocytic anemia who presented with mechanical fall, right hip pain.  Found in the ER to have nondisplaced RIGHT intertrochanteric fracture.   Assessment & Plan:  Closed right hip fracture Cleared by Cardiology.  S/p IM nail on 10/10.   -Continue lovenox for DVT ppx   Acute on chronic macrocytic anemia Baseline macrocytosis, unclear cause of anemia. Now with superimposed acute blood loss anemia.   -Transfusion threshold 8 g/dL -Trend Hgb -Check B12, folate -Continue iron  Chronic systolic CHF Hypertenion EF 25% in 2015. -Strict I/Os -Monitor for CHF symptoms -Continue amiodarone, statin, carvedilol -Restart home furosdmie today -Hold home ramipril for now  BPH Urinary retention Unable to void today. -Place indwelling foley -Titrate up Flomax  Diabetes -Continue gabapentin -Continue SSI  OPther medications -Continue eye drops  Hypothyroidism -Continue levothyroxine  CKD IV  Cr slightly down to 1.5 today.        DVT prophylaxis: Lovenox Code Status: FULL Family Communication: None present MDM and disposition Plan: The below labs and imaging reports were reviewed and summarized above.  Medication management as above.  The patient was admitted with hip fracture.  Post-op course with expected blood loss worsening anemia, stable renal function.    Will restart home Lasix today, likely to [facility] Monday.   Consultants:   Orthopedics  Procedures:   IM Nail 10/10  Antimicrobials:   None    Subjective: No compliants.  Hip pain tolerable.  No chest pain, dyspnea, leg swelling, orthopnea.  Nursing note a little disorientation this morning.  Also, some noted hematuria and urinary retention.  No  fever.  Objective: Vitals:   08/01/18 1422 08/01/18 1734 08/02/18 0404 08/02/18 1336  BP: 123/77  106/69 (!) 102/45  Pulse: (!) 56 67 63 67  Resp: 16  18 17   Temp: 97.7 F (36.5 C)  98.5 F (36.9 C) 97.6 F (36.4 C)  TempSrc: Oral  Oral Oral  SpO2: 100%  98% 96%  Weight:      Height:        Intake/Output Summary (Last 24 hours) at 08/02/2018 1350 Last data filed at 08/02/2018 1337 Gross per 24 hour  Intake 490 ml  Output -  Net 490 ml   Filed Weights   07/31/18 2201  Weight: 70.8 kg    Examination: General appearance: Elderly adult male, alert and in no acute distress.  Empty breakfast tray in front of him. HEENT: Anicteric, right eye covered, left eye shows conjunctiva pink, lids and lashes normal. No nasal deformity, discharge, epistaxis.  Lips moist, dentures in place, OP dry, no oral lesions, hearing a little diminished.   Skin: Warm and dry.  No jaundice.  No suspicious rashes or lesions. Cardiac: RRR, nl S1-S2, no murmurs appreciated.  Capillary refill is brisk.  JVP normal.  No LE edema.  Radia  pulses 2+ and symmetric. Respiratory: Normal respiratory rate and rhythm.  CTAB without rales or wheezes. Abdomen: Abdomen soft.  No TTP. No ascites, distension, hepatosplenomegaly.   MSK: No deformities or effusions. Neuro: Awake and alert.  EOMI of left eye, moves all extremities. Speech fluent.    Psych: Sensorium intact and responding to questions, attention normal. Affect normal.  Judgment and insight  appear slightly impaired.    Data Reviewed: I have personally reviewed following labs and imaging studies:  CBC: Recent Labs  Lab 07/31/18 2318 08/01/18 0518 08/02/18 0927  WBC 10.7* 11.3* 9.5  NEUTROABS 6.8  --   --   HGB 10.8* 9.9* 8.7*  HCT 33.6* 31.1* 27.0*  MCV 101.8* 100.3* 101.9*  PLT 175 167 242   Basic Metabolic Panel: Recent Labs  Lab 07/31/18 2318 08/01/18 0518 08/02/18 0927  NA 136 138 134*  K 4.8 5.3* 4.7  CL 100 100 100  CO2 28 29 29     GLUCOSE 155* 163* 123*  BUN 23 23 20   CREATININE 1.70* 1.71* 1.55*  CALCIUM 8.8* 8.7* 8.3*   GFR: Estimated Creatinine Clearance: 33.2 mL/min (A) (by C-G formula based on SCr of 1.55 mg/dL (H)). Liver Function Tests: No results for input(s): AST, ALT, ALKPHOS, BILITOT, PROT, ALBUMIN in the last 168 hours. No results for input(s): LIPASE, AMYLASE in the last 168 hours. No results for input(s): AMMONIA in the last 168 hours. Coagulation Profile: Recent Labs  Lab 07/31/18 2325  INR 1.17   Cardiac Enzymes: No results for input(s): CKTOTAL, CKMB, CKMBINDEX, TROPONINI in the last 168 hours. BNP (last 3 results) No results for input(s): PROBNP in the last 8760 hours. HbA1C: No results for input(s): HGBA1C in the last 72 hours. CBG: Recent Labs  Lab 08/01/18 1326 08/01/18 1615 08/01/18 2334 08/02/18 0401 08/02/18 1107  GLUCAP 117* 142* 122* 103* 122*   Lipid Profile: No results for input(s): CHOL, HDL, LDLCALC, TRIG, CHOLHDL, LDLDIRECT in the last 72 hours. Thyroid Function Tests: No results for input(s): TSH, T4TOTAL, FREET4, T3FREE, THYROIDAB in the last 72 hours. Anemia Panel: No results for input(s): VITAMINB12, FOLATE, FERRITIN, TIBC, IRON, RETICCTPCT in the last 72 hours. Urine analysis: No results found for: COLORURINE, APPEARANCEUR, LABSPEC, PHURINE, GLUCOSEU, HGBUR, BILIRUBINUR, KETONESUR, PROTEINUR, UROBILINOGEN, NITRITE, LEUKOCYTESUR Sepsis Labs: @LABRCNTIP (procalcitonin:4,lacticacidven:4)  ) Recent Results (from the past 240 hour(s))  Surgical pcr screen     Status: None   Collection Time: 08/01/18  2:55 AM  Result Value Ref Range Status   MRSA, PCR NEGATIVE NEGATIVE Final   Staphylococcus aureus NEGATIVE NEGATIVE Final    Comment: (NOTE) The Xpert SA Assay (FDA approved for NASAL specimens in patients 67 years of age and older), is one component of a comprehensive surveillance program. It is not intended to diagnose infection nor to guide or monitor  treatment. Performed at [facility], [place] [address], [place], [place] [address]          Radiology Studies: Dg Pelvis Portable  Result Date: 08/01/2018 CLINICAL DATA:  Right hip ORIF. EXAM: PORTABLE PELVIS 1-2 VIEWS COMPARISON:  Intraoperative radiographs of the same day. FINDINGS: Single AP view pelvis demonstrates a proximal right femoral IM rod with a dynamic hip screw. Right intratrochanteric fracture is reduced. Extensive vascular calcifications are present. IMPRESSION: ORIF of right intertrochanteric fracture without radiographic evidence for complication. Electronically Signed   By: San Morelle M.D.   On: 08/01/2018 14:22   Dg Chest Port 1 View  Result Date: 08/01/2018 CLINICAL DATA:  Preoperative for hip fracture. EXAM: PORTABLE CHEST 1 VIEW COMPARISON:  03/31/2015 FINDINGS: Postoperative changes in the mediastinum. Cardiac pacemaker. Shallow inspiration. Cardiac enlargement without vascular congestion. No edema or consolidation. There is chronic blunting of the left costophrenic angle probably due to pleural thickening. No pneumothorax. Calcification of the aorta. IMPRESSION: Cardiac enlargement. No evidence of active pulmonary disease. Chronic blunting of the left costophrenic  angle probably due to pleural thickening. Electronically Signed   By: Lucienne Capers M.D.   On: 08/01/2018 00:20   Dg C-arm 1-60 Min  Result Date: 08/01/2018 CLINICAL DATA:  Hip fracture fixation EXAM: DG C-ARM 61-120 MIN; OPERATIVE RIGHT HIP WITH PELVIS COMPARISON:  And 1,019 FINDINGS: Intertrochanteric fracture has been fixed with a rod and screw. Fracture alignment satisfactory. Hardware positioning is satisfactory. IMPRESSION: Surgical fixation of intertrochanteric fracture. Electronically Signed   By: Franchot Gallo M.D.   On: 08/01/2018 14:04   Dg Hip Operative Unilat W Or W/o Pelvis Right  Result Date: 08/01/2018 CLINICAL DATA:  Hip fracture fixation EXAM: DG C-ARM 61-120 MIN;  OPERATIVE RIGHT HIP WITH PELVIS COMPARISON:  And 1,019 FINDINGS: Intertrochanteric fracture has been fixed with a rod and screw. Fracture alignment satisfactory. Hardware positioning is satisfactory. IMPRESSION: Surgical fixation of intertrochanteric fracture. Electronically Signed   By: Franchot Gallo M.D.   On: 08/01/2018 14:04   Dg Hip Unilat  With Pelvis 2-3 Views Right  Result Date: 07/31/2018 CLINICAL DATA:  Fall with right hip pain EXAM: DG HIP (WITH OR WITHOUT PELVIS) 2-3V RIGHT COMPARISON:  None. FINDINGS: Nondisplaced intertrochanteric fracture of the proximal right femur. Femoroacetabular joint is approximated. No pelvic fracture or diastasis. Normal left hip. Extensive vascular calcification. IMPRESSION: Nondisplaced intertrochanteric fracture of the right femur. Electronically Signed   By: Ulyses Jarred M.D.   On: 07/31/2018 23:02        Scheduled Meds: . amiodarone  100 mg Oral q morning - 10a  . atorvastatin  20 mg Oral Daily  . calcium carbonate  1 tablet Oral Daily  . carvedilol  12.5 mg Oral BID WC  . dorzolamide-timolol  1 drop Left Eye Daily  . enoxaparin (LOVENOX) injection  40 mg Subcutaneous Q24H  . ferrous sulfate  325 mg Oral Q breakfast  . gabapentin  300 mg Oral TID  . insulin aspart  0-9 Units Subcutaneous Q4H  . latanoprost  1 drop Left Eye QHS  . levothyroxine  137 mcg Oral Daily  . multivitamin with minerals  1 tablet Oral Daily  . tamsulosin  0.4 mg Oral QPC breakfast   Continuous Infusions: .  ceFAZolin (ANCEF) IV 2 g (08/02/18 0618)     LOS: 1 day    Time spent: 25 minutes    Edwin Dada, MD [facility] 08/02/2018, 1:50 PM     Pager [phone] --- please page though AMION:  [email or web address] If 7PM-7AM, please contact night-coverage

## 2018-08-02 NOTE — Plan of Care (Signed)
  Problem: Coping: Goal: Level of anxiety will decrease Outcome: Progressing   Problem: Pain Managment: Goal: General experience of comfort will improve Outcome: Progressing   Problem: Safety: Goal: Ability to remain free from injury will improve Outcome: Progressing   Problem: Skin Integrity: Goal: Risk for impaired skin integrity will decrease Outcome: Progressing   

## 2018-08-02 NOTE — Progress Notes (Signed)
Subjective: 1 Day Post-Op Procedure(s) (LRB): INTRAMEDULLARY (IM) NAIL INTERTROCHANTRIC right hip (Right) Patient reports pain as mild.  Some confusion slow responses but answers questions appropriately.    Objective: Vital signs in last 24 hours: Temp:  [97.7 F (36.5 C)-98.5 F (36.9 C)] 98.5 F (36.9 C) (10/12 0404) Pulse Rate:  [56-67] 63 (10/12 0404) Resp:  [15-22] 18 (10/12 0404) BP: (106-131)/(52-77) 106/69 (10/12 0404) SpO2:  [93 %-100 %] 98 % (10/12 0404)  Intake/Output from previous day: 10/11 0701 - 10/12 0700 In: 850 [P.O.:150; I.V.:600; IV Piggyback:100] Out: 75 [Blood:75] Intake/Output this shift: Total I/O In: 120 [P.O.:120] Out: -   Recent Labs    07/31/18 2318 08/01/18 0518 08/02/18 0927  HGB 10.8* 9.9* 8.7*   Recent Labs    08/01/18 0518 08/02/18 0927  WBC 11.3* 9.5  RBC 3.10* 2.65*  HCT 31.1* 27.0*  PLT 167 153   Recent Labs    07/31/18 2318 08/01/18 0518  NA 136 138  K 4.8 5.3*  CL 100 100  CO2 28 29  BUN 23 23  CREATININE 1.70* 1.71*  GLUCOSE 155* 163*  CALCIUM 8.8* 8.7*   Recent Labs    07/31/18 2325  INR 1.17    Neurovascular intact Sensation intact distally Intact pulses distally Dorsiflexion/Plantar flexion intact Incision: dressing C/D/I    Assessment/Plan: 1 Day Post-Op Procedure(s) (LRB): INTRAMEDULLARY (IM) NAIL INTERTROCHANTRIC right hip (Right) Up with therapy PT/OT WBAT RLE Pain control as ordered lovenox dvt proph D/c per med team (pt states he lives alone, wife is at [facility] and he is hopeful he will go there as well, uses what sounds like rollator or wheeled walker or cane as baseline) Will cont to follow   Avery Dennison 08/02/2018, 10:13 AM

## 2018-08-02 NOTE — Progress Notes (Signed)
Pt was unable to void. Bladder scan showed >500 ml. Paged MD on call and ordered in and out once and bladder scan every 4-6 hours. RN noticed small skin tear on head part of pt's penis prior to in and out. Pt also complained soreness on the area. Pt was given morphine 0.5mg  before doing in and out. In and out was done and had 600 ml output of clear amber urine. Pt tolerated well. Nursing will continue to monitor.

## 2018-08-03 LAB — CBC
HCT: 30.7 % — ABNORMAL LOW (ref 39.0–52.0)
HEMATOCRIT: 23.7 % — AB (ref 39.0–52.0)
HEMOGLOBIN: 7.7 g/dL — AB (ref 13.0–17.0)
Hemoglobin: 10.2 g/dL — ABNORMAL LOW (ref 13.0–17.0)
MCH: 32.9 pg (ref 26.0–34.0)
MCH: 32.3 pg (ref 26.0–34.0)
MCHC: 32.5 g/dL (ref 30.0–36.0)
MCHC: 33.2 g/dL (ref 30.0–36.0)
MCV: 101.3 fL — AB (ref 80.0–100.0)
MCV: 97.2 fL (ref 80.0–100.0)
NRBC: 0 % (ref 0.0–0.2)
PLATELETS: 155 10*3/uL (ref 150–400)
Platelets: 145 10*3/uL — ABNORMAL LOW (ref 150–400)
RBC: 2.34 MIL/uL — AB (ref 4.22–5.81)
RBC: 3.16 MIL/uL — ABNORMAL LOW (ref 4.22–5.81)
RDW: 13.3 % (ref 11.5–15.5)
RDW: 14.5 % (ref 11.5–15.5)
WBC: 10.2 10*3/uL (ref 4.0–10.5)
WBC: 11.8 10*3/uL — ABNORMAL HIGH (ref 4.0–10.5)
nRBC: 0 % (ref 0.0–0.2)

## 2018-08-03 LAB — BASIC METABOLIC PANEL
ANION GAP: 8 (ref 5–15)
BUN: 25 mg/dL — AB (ref 8–23)
CHLORIDE: 99 mmol/L (ref 98–111)
CO2: 29 mmol/L (ref 22–32)
Calcium: 8.6 mg/dL — ABNORMAL LOW (ref 8.9–10.3)
Creatinine, Ser: 1.62 mg/dL — ABNORMAL HIGH (ref 0.61–1.24)
GFR calc Af Amer: 44 mL/min — ABNORMAL LOW (ref >60)
GFR calc non Af Amer: 38 mL/min — ABNORMAL LOW (ref >60)
GLUCOSE: 123 mg/dL — AB (ref 70–99)
Potassium: 4.7 mmol/L (ref 3.5–5.1)
Sodium: 136 mmol/L (ref 135–145)

## 2018-08-03 LAB — GLUCOSE, CAPILLARY
GLUCOSE-CAPILLARY: 109 mg/dL — AB (ref 70–99)
Glucose-Capillary: 113 mg/dL — ABNORMAL HIGH (ref 70–99)
Glucose-Capillary: 158 mg/dL — ABNORMAL HIGH (ref 70–99)
Glucose-Capillary: 175 mg/dL — ABNORMAL HIGH (ref 70–99)

## 2018-08-03 LAB — VITAMIN B12: Vitamin B-12: 538 pg/mL (ref 180–914)

## 2018-08-03 LAB — PREPARE RBC (CROSSMATCH)

## 2018-08-03 MED ORDER — SODIUM CHLORIDE 0.9% IV SOLUTION
Freq: Once | INTRAVENOUS | Status: AC
  Administered 2018-08-03: 10 mL/h via INTRAVENOUS

## 2018-08-03 NOTE — Progress Notes (Signed)
PROGRESS NOTE    Gregory [name]  TIR:[id number] DOB: 1933-06-15 DOA: 07/31/2018 PCP: Christain Sacramento, MD      Brief Narrative:  Gregory [name] is a 84 y.o. M with sCHF, EF 25%, ICD in place, Hypothyroidism, CKD III baseline Cr 1.5-2.0,  BPH, HTN, DM, and macrocytic anemia who presented with mechanical fall, right hip pain.  Found in the ER to have nondisplaced RIGHT intertrochanteric fracture.   Assessment & Plan:  Closed right hip fracture S/p IM nail on 10/10 Cleared by Cardiology.   -Consult orthopedics, appreciate cares -Continue Lovenox for DVT prophylaxis   Acute on chronic macrocytic anemia Baseline macrocytosis, unclear cause of baseline anemia. Now with superimposed acute blood loss anemia.  Hemoglobin dropping today.  B12 normal.  No clinical bleeding. -Transfuse 1 unit, posttransfusion H&H -Continue furosemide -Transfusion threshold 8 g/dL -Follow folate -Continue iron  Delirium He has had fluctuating disorientation.  None now.  No suspected infection.  Anemia and opiates and surgery/pain contributing.   Chronic systolic CHF Hypertenion EF 25% in 2015.  No signs of hypervolemia present, although we will transfuse a unit today. Renal function stable/imrpoving.  BP soft. -Strict I/Os -Continue amiodarone, statin, carvedilol -Continue furosemide -Hold home ramipril given BP  BPH Urinary retention Failed voiding trial yesterday, Foley placed. -Continue uptitrated dose of Flomax -Voiding trial before discharge  Diabetes Euglycemic -Continue SSI -Continue gabapentin  OPther medications -Continue eye drops  Hypothyroidism -Continue levothyroxine  CKD IV  Creatinine continues to go down.       DVT prophylaxis: Lovenox Code Status: FULL Family Communication: None present MDM and disposition Plan: The below labs and imaging reports were reviewed and summarized above.  Medication management as above.  The patient was admitted with hip fracture.   His postop course has been complicated by blood loss anemia, delirium.  Cr stable, Hgb dropped.  Will transfuse today, continue Lasix monitor respiratory status closely.  If Hgb stable after transfusion, likely to SNF tomorrow.      Consultants:   Orthopedics  Procedures:   IM Nail 10/10  Antimicrobials:   None    Subjective: No chest pain, dyspnea, leg swelling, orthopnea.  He is oriented this morning.  His Foley is uncomfortable, but he has had no fever, flank pain, vomiting.  He is very weak and tired and pale.     Objective: Vitals:   08/02/18 0404 08/02/18 1336 08/02/18 1922 08/03/18 0438  BP: 106/69 (!) 102/45 (!) 113/55 (!) 103/47  Pulse: 63 67 63 61  Resp: 18 17 16 16   Temp: 98.5 F (36.9 C) 97.6 F (36.4 C) 98.3 F (36.8 C) 98 F (36.7 C)  TempSrc: Oral Oral Oral Oral  SpO2: 98% 96% 100% 100%  Weight:      Height:        Intake/Output Summary (Last 24 hours) at 08/03/2018 1034 Last data filed at 08/02/2018 1930 Gross per 24 hour  Intake 420 ml  Output 425 ml  Net -5 ml   Filed Weights   07/31/18 2201  Weight: 70.8 kg    Examination: General appearance: Gregory [name] adult male, lying in bed, no acute distress.  Appears tired. HEENT: The right eye is covered.  The left eye has normal conjunctival, lids, and lashes.  The nasal is normal, no discharge or epistaxis.  Lips dry, dentures in place, oropharynx dry, no oral lesions, hearing diminished.     Skin: Dry, significant pallor today. Cardiac: Regular rate and rhythm, no murmurs, JVP normal, no lower  extremity edema. Respiratory: Normal respiratory rate and rhythm, lungs clear without rales or wheezes Abdomen: Soft without tenderness to palpation, ascites, distention, or hepatospleno megaly.   MSK: No deformities or effusions. Neuro: Awake and alert, extraocular movements intact on the left, cranial nerves grossly normal, speech fluent, globally weak, but symmetric.Marland Kitchen    Psych: Oriented to person, place,  time, attention diminished, affect normal, judgment and insight appear slightly impaired.    Data Reviewed: I have personally reviewed following labs and imaging studies:  CBC: Recent Labs  Lab 07/31/18 2318 08/01/18 0518 08/02/18 0927 08/03/18 0216  WBC 10.7* 11.3* 9.5 10.2  NEUTROABS 6.8  --   --   --   HGB 10.8* 9.9* 8.7* 7.7*  HCT 33.6* 31.1* 27.0* 23.7*  MCV 101.8* 100.3* 101.9* 101.3*  PLT 175 167 153 956*   Basic Metabolic Panel: Recent Labs  Lab 07/31/18 2318 08/01/18 0518 08/02/18 0927 08/03/18 0216  NA 136 138 134* 136  K 4.8 5.3* 4.7 4.7  CL 100 100 100 99  CO2 28 29 29 29   GLUCOSE 155* 163* 123* 123*  BUN 23 23 20  25*  CREATININE 1.70* 1.71* 1.55* 1.62*  CALCIUM 8.8* 8.7* 8.3* 8.6*   GFR: Estimated Creatinine Clearance: 31.7 mL/min (A) (by C-G formula based on SCr of 1.62 mg/dL (H)). Liver Function Tests: No results for input(s): AST, ALT, ALKPHOS, BILITOT, PROT, ALBUMIN in the last 168 hours. No results for input(s): LIPASE, AMYLASE in the last 168 hours. No results for input(s): AMMONIA in the last 168 hours. Coagulation Profile: Recent Labs  Lab 07/31/18 2325  INR 1.17   Cardiac Enzymes: No results for input(s): CKTOTAL, CKMB, CKMBINDEX, TROPONINI in the last 168 hours. BNP (last 3 results) No results for input(s): PROBNP in the last 8760 hours. HbA1C: No results for input(s): HGBA1C in the last 72 hours. CBG: Recent Labs  Lab 08/02/18 1107 08/02/18 1755 08/02/18 2034 08/03/18 0014 08/03/18 0440  GLUCAP 122* 121* 112* 113* 109*   Lipid Profile: No results for input(s): CHOL, HDL, LDLCALC, TRIG, CHOLHDL, LDLDIRECT in the last 72 hours. Thyroid Function Tests: No results for input(s): TSH, T4TOTAL, FREET4, T3FREE, THYROIDAB in the last 72 hours. Anemia Panel: Recent Labs    08/03/18 0216  VITAMINB12 538   Urine analysis: No results found for: COLORURINE, APPEARANCEUR, LABSPEC, PHURINE, GLUCOSEU, HGBUR, BILIRUBINUR, KETONESUR,  PROTEINUR, UROBILINOGEN, NITRITE, LEUKOCYTESUR Sepsis Labs: @LABRCNTIP (procalcitonin:4,lacticacidven:4)  ) Recent Results (from the past 240 hour(s))  Surgical pcr screen     Status: None   Collection Time: 08/01/18  2:55 AM  Result Value Ref Range Status   MRSA, PCR NEGATIVE NEGATIVE Final   Staphylococcus aureus NEGATIVE NEGATIVE Final    Comment: (NOTE) The Xpert SA Assay (FDA approved for NASAL specimens in patients 42 years of age and older), is one component of a comprehensive surveillance program. It is not intended to diagnose infection nor to guide or monitor treatment. Performed at [facility], [place] [address], [place], [place] [address]          Radiology Studies: Dg Pelvis Portable  Result Date: 08/01/2018 CLINICAL DATA:  Right hip ORIF. EXAM: PORTABLE PELVIS 1-2 VIEWS COMPARISON:  Intraoperative radiographs of the same day. FINDINGS: Single AP view pelvis demonstrates a proximal right femoral IM rod with a dynamic hip screw. Right intratrochanteric fracture is reduced. Extensive vascular calcifications are present. IMPRESSION: ORIF of right intertrochanteric fracture without radiographic evidence for complication. Electronically Signed   By: Wynetta Fines.D.  On: 08/01/2018 14:22   Dg C-arm 1-60 Min  Result Date: 08/01/2018 CLINICAL DATA:  Hip fracture fixation EXAM: DG C-ARM 61-120 MIN; OPERATIVE RIGHT HIP WITH PELVIS COMPARISON:  And 1,019 FINDINGS: Intertrochanteric fracture has been fixed with a rod and screw. Fracture alignment satisfactory. Hardware positioning is satisfactory. IMPRESSION: Surgical fixation of intertrochanteric fracture. Electronically Signed   By: Franchot Gallo M.D.   On: 08/01/2018 14:04   Dg Hip Operative Unilat W Or W/o Pelvis Right  Result Date: 08/01/2018 CLINICAL DATA:  Hip fracture fixation EXAM: DG C-ARM 61-120 MIN; OPERATIVE RIGHT HIP WITH PELVIS COMPARISON:  And 1,019 FINDINGS: Intertrochanteric fracture has been  fixed with a rod and screw. Fracture alignment satisfactory. Hardware positioning is satisfactory. IMPRESSION: Surgical fixation of intertrochanteric fracture. Electronically Signed   By: Franchot Gallo M.D.   On: 08/01/2018 14:04        Scheduled Meds: . sodium chloride   Intravenous Once  . amiodarone  100 mg Oral q morning - 10a  . atorvastatin  20 mg Oral Daily  . calcium carbonate  1 tablet Oral Daily  . carvedilol  12.5 mg Oral BID WC  . dorzolamide-timolol  1 drop Left Eye Daily  . enoxaparin (LOVENOX) injection  40 mg Subcutaneous Q24H  . ferrous sulfate  325 mg Oral Q breakfast  . furosemide  40 mg Oral Daily  . gabapentin  300 mg Oral TID  . insulin aspart  0-9 Units Subcutaneous Q4H  . latanoprost  1 drop Left Eye QHS  . levothyroxine  137 mcg Oral Daily  . multivitamin with minerals  1 tablet Oral Daily  . tamsulosin  0.4 mg Oral BID   Continuous Infusions:    LOS: 2 days    Time spent: 25 minutes    Edwin Dada, MD [facility] 08/03/2018, 10:34 AM     Pager [phone] --- please page though AMION:  [email or web address] Password [email or web address] If 7PM-7AM, please contact night-coverage

## 2018-08-03 NOTE — Progress Notes (Signed)
PT Cancellation Note  Patient Details Name: Gregory [name] MRN: [id number] DOB: 19-Sep-1933   Cancelled Treatment:     Pts HBG 7.7 and pt getting blood.  He seemed confused today.  Will hold PT today and try again tomorrow if not leaving for SNF.     Loyal Buba 08/03/2018, 2:35 PM

## 2018-08-03 NOTE — Plan of Care (Signed)
  Problem: Coping: Goal: Level of anxiety will decrease Outcome: Progressing   Problem: Pain Managment: Goal: General experience of comfort will improve Outcome: Progressing   Problem: Safety: Goal: Ability to remain free from injury will improve Outcome: Progressing   Problem: Skin Integrity: Goal: Risk for impaired skin integrity will decrease Outcome: Progressing   

## 2018-08-03 NOTE — Progress Notes (Signed)
Subjective: 2 Days Post-Op Procedure(s) (LRB): INTRAMEDULLARY (IM) NAIL INTERTROCHANTRIC right hip (Right) Patient reports pain as mild.    Objective: Vital signs in last 24 hours: Temp:  [97.6 F (36.4 C)-98.3 F (36.8 C)] 98 F (36.7 C) (10/13 0438) Pulse Rate:  [61-67] 61 (10/13 0438) Resp:  [16-17] 16 (10/13 0438) BP: (102-113)/(45-55) 103/47 (10/13 0438) SpO2:  [96 %-100 %] 100 % (10/13 0438)  Intake/Output from previous day: 10/12 0701 - 10/13 0700 In: 540 [P.O.:240; IV Piggyback:300] Out: 425 [Urine:425] Intake/Output this shift: No intake/output data recorded.  Recent Labs    07/31/18 2318 08/01/18 0518 08/02/18 0927 08/03/18 0216  HGB 10.8* 9.9* 8.7* 7.7*   Recent Labs    08/02/18 0927 08/03/18 0216  WBC 9.5 10.2  RBC 2.65* 2.34*  HCT 27.0* 23.7*  PLT 153 145*   Recent Labs    08/02/18 0927 08/03/18 0216  NA 134* 136  K 4.7 4.7  CL 100 99  CO2 29 29  BUN 20 25*  CREATININE 1.55* 1.62*  GLUCOSE 123* 123*  CALCIUM 8.3* 8.6*   Recent Labs    07/31/18 2325  INR 1.17    Neurovascular intact Sensation intact distally Intact pulses distally Dorsiflexion/Plantar flexion intact Incision: dressing C/D/I    Assessment/Plan: 2 Days Post-Op Procedure(s) (LRB): INTRAMEDULLARY (IM) NAIL INTERTROCHANTRIC right hip (Right)  Up with therapy PT/OT WBAT RLE Pain control as ordered lovenox dvt proph D/c per med team (pt states he lives alone, wife is at [facility] and he is hopeful he will go there as well, uses what sounds like rollator or wheeled walker or cane as baseline) Will cont to follow    Avery Dennison 08/03/2018, 10:01 AM

## 2018-08-04 ENCOUNTER — Inpatient Hospital Stay (HOSPITAL_COMMUNITY): Payer: PPO

## 2018-08-04 DIAGNOSIS — D72829 Elevated white blood cell count, unspecified: Secondary | ICD-10-CM

## 2018-08-04 LAB — BPAM RBC
BLOOD PRODUCT EXPIRATION DATE: 201911022359
ISSUE DATE / TIME: 201910131142
Unit Type and Rh: 7300

## 2018-08-04 LAB — CBC
HEMATOCRIT: 30.2 % — AB (ref 39.0–52.0)
HEMOGLOBIN: 9.6 g/dL — AB (ref 13.0–17.0)
MCH: 31.3 pg (ref 26.0–34.0)
MCHC: 31.8 g/dL (ref 30.0–36.0)
MCV: 98.4 fL (ref 80.0–100.0)
PLATELETS: 152 10*3/uL (ref 150–400)
RBC: 3.07 MIL/uL — ABNORMAL LOW (ref 4.22–5.81)
RDW: 14.6 % (ref 11.5–15.5)
WBC: 13.4 10*3/uL — ABNORMAL HIGH (ref 4.0–10.5)
nRBC: 0 % (ref 0.0–0.2)

## 2018-08-04 LAB — URINALYSIS, ROUTINE W REFLEX MICROSCOPIC
Bilirubin Urine: NEGATIVE
GLUCOSE, UA: NEGATIVE mg/dL
KETONES UR: NEGATIVE mg/dL
Nitrite: NEGATIVE
PH: 5 (ref 5.0–8.0)
Protein, ur: NEGATIVE mg/dL
Specific Gravity, Urine: 1.016 (ref 1.005–1.030)

## 2018-08-04 LAB — BASIC METABOLIC PANEL
Anion gap: 7 (ref 5–15)
BUN: 35 mg/dL — AB (ref 8–23)
CO2: 29 mmol/L (ref 22–32)
CREATININE: 1.73 mg/dL — AB (ref 0.61–1.24)
Calcium: 8.5 mg/dL — ABNORMAL LOW (ref 8.9–10.3)
Chloride: 99 mmol/L (ref 98–111)
GFR calc Af Amer: 41 mL/min — ABNORMAL LOW (ref >60)
GFR, EST NON AFRICAN AMERICAN: 35 mL/min — AB (ref >60)
GLUCOSE: 174 mg/dL — AB (ref 70–99)
POTASSIUM: 4.9 mmol/L (ref 3.5–5.1)
SODIUM: 135 mmol/L (ref 135–145)

## 2018-08-04 LAB — TYPE AND SCREEN
ABO/RH(D): B POS
Antibody Screen: NEGATIVE
Unit division: 0

## 2018-08-04 LAB — FOLATE RBC
Folate, Hemolysate: >620 ng/mL
Folate, RBC: >2672 ng/mL (ref >498)
Hematocrit: 23.2 % — ABNORMAL LOW (ref 37.5–51.0)

## 2018-08-04 LAB — LACTIC ACID, PLASMA: Lactic Acid, Venous: 1 mmol/L (ref 0.5–1.9)

## 2018-08-04 MED ORDER — SODIUM CHLORIDE 0.9 % IV BOLUS
250.0000 mL | Freq: Once | INTRAVENOUS | Status: AC
  Administered 2018-08-04: 250 mL via INTRAVENOUS

## 2018-08-04 MED ORDER — SODIUM CHLORIDE 0.9 % IV SOLN
500.0000 mg | INTRAVENOUS | Status: DC
  Administered 2018-08-04: 500 mg via INTRAVENOUS
  Filled 2018-08-04: qty 500

## 2018-08-04 MED ORDER — SODIUM CHLORIDE 0.9 % IV SOLN
1.0000 g | Freq: Every day | INTRAVENOUS | Status: DC
  Administered 2018-08-04: 1 g via INTRAVENOUS
  Filled 2018-08-04 (×2): qty 10

## 2018-08-04 MED ORDER — LIDOCAINE HCL 1 % IJ SOLN
INTRAMUSCULAR | Status: AC
  Filled 2018-08-04: qty 20

## 2018-08-04 NOTE — Plan of Care (Signed)

## 2018-08-04 NOTE — Progress Notes (Signed)
CSW contacted [employer] who has started insurance authorization today. [facility] has one bed left holding for patient so he can be with his wife. [employer] will attempt to push through insurance auth today.   Gregory [name], Gregory [name]

## 2018-08-04 NOTE — NC FL2 (Signed)
[place] MEDICAID FL2 LEVEL OF CARE SCREENING TOOL     IDENTIFICATION  Patient Name: Gregory [name] Birthdate: 09-11-1933 Sex: male Admission Date (Current Location): 07/31/2018  [facility] and [place] Number:  Herbalist and Address:  [facility], [place] [address] [place], [place] [address]      Provider Number: [id number]  Attending Physician Name and Address:  Edwin Dada, *  Relative Name and Phone Number:  Leafy Ro (niece) [phone]    Current Level of Care: Hospital Recommended Level of Care: [place] Prior Approval Number:    Date Approved/Denied: 08/04/18 PASRR Number: [id number]  Discharge Plan: SNF    Current Diagnoses: Patient Active Problem List   Diagnosis Date Noted  . Closed right hip fracture, initial encounter ([place]) 08/01/2018  . Abnormality of gait 09/19/2016  . Unexplained night sweats 03/31/2015  . Constipation 05/18/2013  . Ischemic heart disease   . Myocardial infarction ([place])   . Hypothyroidism   . Diabetes mellitus ([place])   . Dyslipidemia   . Benign hypertensive heart disease without heart failure   . Ischemic cardiomyopathy   . Diabetic neuropathy ([place])   . HYPOTHYROIDISM 03/15/2009  . DYSLIPIDEMIA 03/15/2009  . 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 Implantable cardioverter-defibrillator (ICD) in situ 03/15/2009  . DM 02/02/2008  . DEPRESSION 02/02/2008  . MI 02/02/2008  . CAD 02/02/2008  . CHF 02/02/2008    Orientation RESPIRATION BLADDER Height & Weight     Self, Time, Situation, Place  O2(2L/M nasal cannula) Continent, External catheter Weight: 156 lb (70.8 kg) Height:  5\' 6"  (167.6 cm)  BEHAVIORAL SYMPTOMS/MOOD NEUROLOGICAL BOWEL NUTRITION STATUS      Continent Diet(see discharge summary)  AMBULATORY STATUS COMMUNICATION OF NEEDS Skin   Extensive Assist Verbally Skin abrasions, Surgical wounds(abrasions on right and left legs and arms, right hip closed surgical  incision)                       Personal Care Assistance Level of Assistance  Bathing, Feeding, Dressing, Total care Bathing Assistance: Maximum assistance Feeding assistance: Limited assistance Dressing Assistance: Maximum assistance Total Care Assistance: Maximum assistance   Functional Limitations Info  Sight, Hearing, Speech Sight Info: Impaired Hearing Info: Impaired(hearing aid) Speech Info: Adequate    SPECIAL CARE FACTORS FREQUENCY  PT (By licensed PT), OT (By licensed OT)     PT Frequency: 5x weekly OT Frequency: 5x weekly            Contractures Contractures Info: Not present    Additional Factors Info  Allergies   Allergies Info: Allergies: No Known           Current Medications (08/04/2018):  This is the current hospital active medication list Current Facility-Administered Medications  Medication Dose Route Frequency Provider Last Rate Last Dose  . amiodarone (PACERONE) tablet 100 mg  100 mg Oral q morning - 10a Haddix, Thomasene Lot, MD   100 mg at 08/04/18 1012  . atorvastatin (LIPITOR) tablet 20 mg  20 mg Oral Daily Haddix, Thomasene Lot, MD   20 mg at 08/04/18 1012  . calcium carbonate (TUMS - dosed in mg elemental calcium) chewable tablet 200 mg of elemental calcium  1 tablet Oral Daily Haddix, Thomasene Lot, MD   200 mg of elemental calcium at 08/04/18 1012  . carvedilol (COREG) tablet 12.5 mg  12.5 mg Oral BID WC Haddix, Thomasene Lot, MD   12.5 mg at 08/04/18 1012  .  cefTRIAXone (ROCEPHIN) 1 g in sodium chloride 0.9 % 100 mL IVPB  1 g Intravenous Daily Danford, Christopher P, MD      . dorzolamide-timolol (COSOPT) 22.3-6.8 MG/ML ophthalmic solution 1 drop  1 drop Left Eye Daily Haddix, Thomasene Lot, MD   1 drop at 08/04/18 1018  . enoxaparin (LOVENOX) injection 40 mg  40 mg Subcutaneous Q24H Haddix, Thomasene Lot, MD   40 mg at 08/04/18 1019  . ferrous sulfate tablet 325 mg  325 mg Oral Q breakfast Haddix, Thomasene Lot, MD   325 mg at 08/04/18 1011  . furosemide (LASIX) tablet 40 mg   40 mg Oral Daily Danford, Suann Larry, MD   40 mg at 08/04/18 1011  . gabapentin (NEURONTIN) capsule 300 mg  300 mg Oral TID Haddix, Thomasene Lot, MD   300 mg at 08/04/18 1011  . HYDROcodone-acetaminophen (NORCO/VICODIN) 5-325 MG per tablet 1 tablet  1 tablet Oral Q4H PRN Edwin Dada, MD   1 tablet at 08/03/18 2058  . latanoprost (XALATAN) 0.005 % ophthalmic solution 1 drop  1 drop Left Eye QHS Haddix, Thomasene Lot, MD   1 drop at 08/03/18 2058  . levothyroxine (SYNTHROID, LEVOTHROID) tablet 137 mcg  137 mcg Oral Daily Haddix, Thomasene Lot, MD   137 mcg at 08/04/18 1012  . multivitamin with minerals tablet 1 tablet  1 tablet Oral Daily Rise Patience, MD   1 tablet at 08/04/18 1012  . nitroGLYCERIN (NITROSTAT) SL tablet 0.4 mg  0.4 mg Sublingual Q5 min PRN Haddix, Thomasene Lot, MD      . tamsulosin (FLOMAX) capsule 0.4 mg  0.4 mg Oral BID Edwin Dada, MD   0.4 mg at 08/04/18 1011     Discharge Medications: Please see discharge summary for a list of discharge medications.  Relevant Imaging Results:  Relevant Lab Results:   Additional Information SSN: [id number]  Alberteen Sam, LCSW

## 2018-08-04 NOTE — Progress Notes (Signed)
  I performed limited US of the left chest. There is minimal pleural fluid.   The appearance on Chest X-ray could be secondary to consolidation.  Recommend CT scan to better evaluate.  Reviewed images with Dr. Vernard Gambles and he is in agreement.  Judie Grieve Janari Yamada PA-C 08/04/2018 4:35 PM

## 2018-08-04 NOTE — Progress Notes (Signed)
PROGRESS NOTE    REGINO FOURNET  EZM:[id number] DOB: 1933-02-08 DOA: 07/31/2018 PCP: Christain Sacramento, MD      Brief Narrative:  Mr. Oldaker is a 84 y.o. M with sCHF, EF 25%, ICD in place, Hypothyroidism, CKD III baseline Cr 1.5-2.0,  BPH, HTN, DM, and macrocytic anemia who presented with mechanical fall, right hip pain.  Found in the ER to have nondisplaced RIGHT intertrochanteric fracture.   Assessment & Plan:  Closed right hip fracture S/p IM nail on 10/10 Cleared by Cardiology preoperatively.   -Consult orthopedics, appreciate cares -Continue Lovenox for DVT prophylaxis   Leukocytosis Patient considerably more sluggish today.  WBC rising to 13K today and hypoxic.  Suspect hypoxia is from atelectasis, givne no cough, resp distress, but will obtain CxR.  More likely leukocytosis is UTI, given foley placed 2days ago for urine retention. -Ceftriaxone IV -Replace foley -Obtain blood and urine cultures; check lactic acid -obtain CXR  Acute on chronic macrocytic anemia Baseline macrocytosis, unclear cause of baseline anemia. Now with superimposed acute blood loss anemia.  Hemoglobin dropping today.  B12 normal.  No clinical bleeding.  Transfused 1 unit on 10/13, appropriate post-transfusion increase in Hgb.  -Continue furosemide -Transfusion threshold 8 g/dL -Follow folate -Continue iron  Delirium Continues, from opiates, pain, surgery, anemia, and likely infection,.   Chronic systolic CHF Hypertenion EF 25% in 2015.  No signs of hypervolemia. -Strict I/Os -Continue amiodarone, statin -Hold carvedilol and furosemide 40 given soft BP -Hold home ramipril given BP  BPH Urinary retention Failed voiding trial, Foley placed two days ago. -Continue uptitrated dose Flomax -Voiding trial before discharge  Diabetes No hyperglycemia -Continue SSI -Continue gabapentin  OPther medications -Continue eye drops  Hypothyroidism -Continue levothyroxine  CKD IV  Cr  stable       DVT prophylaxis: Lovenox Code Status: FULL Family Communication: None present MDM and disposition Plan: The below labs and imaging reports were reviewed and summarized above.  Medication management as above.  The patient was admitted with hip fracture, and his postop course was complicated by blood loss anemia requiring transfusion and delirium.  He has not had his rising WBC, suspect infection.  We will start empiric antibiotics, will replace foley, obtain cultures and chest x-ray.  Likely to SNF in 2 days.     Consultants:   Orthopedics  Procedures:   IM Nail 10/10  Antimicrobials:   None    Subjective: Patient very sluggish today, no new fever, vomiting, flank pain, respiratory distress, confusion.  He is persistently hypoxic.  He is very pale today.     Objective: Vitals:   08/04/18 0403 08/04/18 1008 08/04/18 1021 08/04/18 1055  BP: (!) 107/57 (!) 117/54    Pulse: 64 71    Resp: 16 15    Temp: 98.2 F (36.8 C) 98.4 F (36.9 C)    TempSrc: Oral Oral    SpO2: (!) 81% (!) 88% 93% (!) 86%  Weight:      Height:        Intake/Output Summary (Last 24 hours) at 08/04/2018 1214 Last data filed at 08/04/2018 1135 Gross per 24 hour  Intake 1035 ml  Output 1100 ml  Net -65 ml   Filed Weights   07/31/18 2201  Weight: 70.8 kg    Examination: General appearance: Elderly adult male, appears sluggish, weak, no obvious distress.  Not oriented today. HEENT: The right eye is atrophied.  Left eye appears normal.  No nasal deformity, discharge, or epistaxis.  Lips  dry, dentures in place, oropharynx dry, no oral lesions, hearing diminished.    Skin: Pallor, skin dry. Cardiac: Heart rate regular, no murmurs, no elevation in JVP, no lower extremity edema Respiratory: Respiratory effort shallow, normal rate, lungs without rales or wheezes that I can appreciate, respiratory effort weak. Abdomen: Abdomen soft without tenderness to palpation, ascites,  distention, or hepatospleno megaly. MSK: No deformities or effusions. Neuro: Awake and makes eye contact, but very sluggish and does not answer questions reliably, does not follow commands, moves both upper extremities with extremely weak but symmetric strength, speech slurred due to dry mouth.    Psych: Orientation difficult to assess, attention diminished, judgment and insight appear impaired.    Data Reviewed: I have personally reviewed following labs and imaging studies:  CBC: Recent Labs  Lab 07/31/18 2318 08/01/18 0518 08/02/18 0927 08/03/18 0216 08/03/18 2009 08/04/18 0352  WBC 10.7* 11.3* 9.5 10.2 11.8* 13.4*  NEUTROABS 6.8  --   --   --   --   --   HGB 10.8* 9.9* 8.7* 7.7* 10.2* 9.6*  HCT 33.6* 31.1* 27.0* 23.7* 30.7* 30.2*  MCV 101.8* 100.3* 101.9* 101.3* 97.2 98.4  PLT 175 167 153 145* 155 161   Basic Metabolic Panel: Recent Labs  Lab 07/31/18 2318 08/01/18 0518 08/02/18 0927 08/03/18 0216 08/04/18 0352  NA 136 138 134* 136 135  K 4.8 5.3* 4.7 4.7 4.9  CL 100 100 100 99 99  CO2 28 29 29 29 29   GLUCOSE 155* 163* 123* 123* 174*  BUN 23 23 20  25* 35*  CREATININE 1.70* 1.71* 1.55* 1.62* 1.73*  CALCIUM 8.8* 8.7* 8.3* 8.6* 8.5*   GFR: Estimated Creatinine Clearance: 29.7 mL/min (A) (by C-G formula based on SCr of 1.73 mg/dL (H)). Liver Function Tests: No results for input(s): AST, ALT, ALKPHOS, BILITOT, PROT, ALBUMIN in the last 168 hours. No results for input(s): LIPASE, AMYLASE in the last 168 hours. No results for input(s): AMMONIA in the last 168 hours. Coagulation Profile: Recent Labs  Lab 07/31/18 2325  INR 1.17   Cardiac Enzymes: No results for input(s): CKTOTAL, CKMB, CKMBINDEX, TROPONINI in the last 168 hours. BNP (last 3 results) No results for input(s): PROBNP in the last 8760 hours. HbA1C: No results for input(s): HGBA1C in the last 72 hours. CBG: Recent Labs  Lab 08/02/18 2034 08/03/18 0014 08/03/18 0440 08/03/18 1148 08/03/18 1646   GLUCAP 112* 113* 109* 158* 175*   Lipid Profile: No results for input(s): CHOL, HDL, LDLCALC, TRIG, CHOLHDL, LDLDIRECT in the last 72 hours. Thyroid Function Tests: No results for input(s): TSH, T4TOTAL, FREET4, T3FREE, THYROIDAB in the last 72 hours. Anemia Panel: Recent Labs    08/03/18 0216  VITAMINB12 538   Urine analysis: No results found for: COLORURINE, APPEARANCEUR, LABSPEC, PHURINE, GLUCOSEU, HGBUR, BILIRUBINUR, KETONESUR, PROTEINUR, UROBILINOGEN, NITRITE, LEUKOCYTESUR Sepsis Labs: @LABRCNTIP (procalcitonin:4,lacticacidven:4)  ) Recent Results (from the past 240 hour(s))  Surgical pcr screen     Status: None   Collection Time: 08/01/18  2:55 AM  Result Value Ref Range Status   MRSA, PCR NEGATIVE NEGATIVE Final   Staphylococcus aureus NEGATIVE NEGATIVE Final    Comment: (NOTE) The Xpert SA Assay (FDA approved for NASAL specimens in patients 9 years of age and older), is one component of a comprehensive surveillance program. It is not intended to diagnose infection nor to guide or monitor treatment. Performed at [facility], [place] [address] [place], [place] [address]  Radiology Studies: No results found.      Scheduled Meds: . amiodarone  100 mg Oral q morning - 10a  . atorvastatin  20 mg Oral Daily  . calcium carbonate  1 tablet Oral Daily  . carvedilol  12.5 mg Oral BID WC  . dorzolamide-timolol  1 drop Left Eye Daily  . enoxaparin (LOVENOX) injection  40 mg Subcutaneous Q24H  . ferrous sulfate  325 mg Oral Q breakfast  . furosemide  40 mg Oral Daily  . gabapentin  300 mg Oral TID  . latanoprost  1 drop Left Eye QHS  . levothyroxine  137 mcg Oral Daily  . multivitamin with minerals  1 tablet Oral Daily  . tamsulosin  0.4 mg Oral BID   Continuous Infusions: . cefTRIAXone (ROCEPHIN)  IV       LOS: 3 days    Time spent: 25 minutes    Edwin Dada, MD [facility] 08/04/2018, 12:14 PM     Pager  [phone] --- please page though AMION:  [email or web address] Password [email or web address] If 7PM-7AM, please contact night-coverage

## 2018-08-04 NOTE — Progress Notes (Signed)
Physical Therapy Treatment Patient Details Name: Gregory [name] MRN: [id number] DOB: 1933/03/05 Today's Date: 08/04/2018    History of Present Illness  Gregory [name] is a 84 y.o. male with history of ischemic cardiomyopathy status post ICD placement and on amiodarone, diabetes mellitus type 2, hypertension, hypothyroidism and hyperlipidemia was brought from the home after patient had a fall.  pt with right IM  nail.  Pt WBAT    PT Comments    Pt agitated today, frustrated that he hadn't had his lunch but deferred help from RN and therapist. Needed max A to get to EOB as well as max A +2 for sit to stand and SPT to recliner. Pt very weak today and mildly confused. PT will continue to follow.   Follow Up Recommendations  SNF     Equipment Recommendations  None recommended by PT    Recommendations for Other Services       Precautions / Restrictions Precautions Precautions: Fall Restrictions Weight Bearing Restrictions: Yes RLE Weight Bearing: Weight bearing as tolerated    Mobility  Bed Mobility Overal bed mobility: Needs Assistance Bed Mobility: Supine to Sit     Supine to sit: Max assist     General bed mobility comments: pt able to reach across self with R arm to grasp L rail, max A for LE's off bed and elevation of trunk to sitting and at times pt resisting instead of assisting  Transfers Overall transfer level: Needs assistance Equipment used: Rolling walker (2 wheeled) Transfers: Sit to/from [facility] Sit to Stand: +2 physical assistance;Max assist Stand pivot transfers: Max assist;+2 physical assistance       General transfer comment: pt very weak today. Attempted squat pivot transfer without AD but pt very fearful and could not lift buttocks from bed. With RW, pt acheived partial stand with max A +2 with shuffling steps to recliner but pt very fearful the whole time  Ambulation/Gait             General Gait Details: unable  today   Stairs             Wheelchair Mobility    Modified Rankin (Stroke Patients Only)       Balance Overall balance assessment: Needs assistance;History of Falls Sitting-balance support: Feet supported;No upper extremity supported Sitting balance-Leahy Scale: Fair Sitting balance - Comments: strong L lean at first needing min A but progressed to supervision   Standing balance support: Bilateral upper extremity supported;During functional activity Standing balance-Leahy Scale: Zero Standing balance comment: required max A to stand                            Cognition Arousal/Alertness: Awake/alert Behavior During Therapy: Agitated Overall Cognitive Status: Impaired/Different from baseline Area of Impairment: Following commands;Problem solving;Memory                     Memory: Decreased short-term memory Following Commands: Follows one step commands inconsistently     Problem Solving: Requires verbal cues;Slow processing General Comments: pt becomes angry very easily today, reports that no one has helped him eat lunch but then RN reported that he said he didn't want his food. When  this therapist attempted to help him eat he became frustrated with his dentures and then said he just didn't want any help      Exercises Total Joint Exercises Ankle Circles/Pumps: AROM;10 reps;Both    General Comments General comments (  skin integrity, edema, etc.): pt cried out with all motion of both LE's and was not participatory when attempting ther ex      Pertinent Vitals/Pain Pain Assessment: Faces Faces Pain Scale: Hurts whole lot Pain Location: right hip Pain Descriptors / Indicators: Aching;Grimacing;Guarding;Operative site guarding Pain Intervention(s): Limited activity within patient's tolerance;Monitored during session    Home Living                      Prior Function            PT Goals (current goals can now be found in the care  plan section) Acute Rehab PT Goals Patient Stated Goal: to stop hurting PT Goal Formulation: With patient/family Time For Goal Achievement: 08/09/18 Potential to Achieve Goals: Fair Progress towards PT goals: Not progressing toward goals - comment(weakness and cognition)    Frequency    Min 3X/week      PT Plan Current plan remains appropriate;Frequency needs to be updated    Co-evaluation              AM-PAC PT "6 Clicks" Daily Activity  Outcome Measure  Difficulty turning over in bed (including adjusting bedclothes, sheets and blankets)?: Unable Difficulty moving from lying on back to sitting on the side of the bed? : Unable Difficulty sitting down on and standing up from a chair with arms (e.g., wheelchair, bedside commode, etc,.)?: Unable Help needed moving to and from a bed to chair (including a wheelchair)?: A Lot Help needed walking in hospital room?: Total Help needed climbing 3-5 steps with a railing? : Total 6 Click Score: 7    End of Session Equipment Utilized During Treatment: Gait belt Activity Tolerance: Patient limited by pain Patient left: in chair;with chair alarm set;with call bell/phone within reach Nurse Communication: Mobility status;Precautions PT Visit Diagnosis: Other abnormalities of gait and mobility (R26.89);History of falling (Z91.81);Difficulty in walking, not elsewhere classified (R26.2);Pain Pain - Right/Left: Right Pain - part of body: Hip     Time: 1333-1406 PT Time Calculation (min) (ACUTE ONLY): 33 min  Charges:  $Therapeutic Activity: 23-37 mins                     [place]  Pager [phone] Office [place] 08/04/2018, 2:34 PM

## 2018-08-04 NOTE — Plan of Care (Signed)
  Problem: Pain Managment: Goal: General experience of comfort will improve Outcome: Progressing   Problem: Safety: Goal: Ability to remain free from injury will improve Outcome: Progressing   

## 2018-08-04 NOTE — Clinical Social Work Note (Signed)
Clinical Social Work Assessment  Patient Details  Name: Gregory [name] MRN: [id number] Date of Birth: November 03, 1932  Date of referral:  08/04/18               Reason for consult:  Discharge Planning, Care Management Concerns                Permission sought to share information with:  Case Manager, Facility Sport and exercise psychologist, Family Supports Permission granted to share information::  Yes, Verbal Permission Granted  Name::     Windell Moment  Agency::  SNFs  Relationship::  Niece  Contact Information:  [phone]  Housing/Transportation Living arrangements for the past 2 months:  [place] of Information:  Patient Patient Interpreter Needed:  None Criminal Activity/Legal Involvement Pertinent to Current Situation/Hospitalization:  No - Comment as needed Significant Relationships:  Other Family Members Lives with:  Self Do you feel safe going back to the place where you live?  No Need for family participation in patient care:  Yes (Comment)  Care giving concerns:  CSW received referral for possible SNF placement at time of discharge. Spoke with patient regarding possibility of SNF placement . Patient's family  is currently unable to care for him at their home given patient's current needs and fall risk.  Patient and  Niece  expressed understanding of PT recommendation and are agreeable to SNF placement at time of discharge. CSW to continue to follow and assist with discharge planning needs.     Social Worker assessment / plan:  Spoke with patient and  niece concerning possibility of rehab at [facility] before returning home.    Employment status:  Retired Forensic scientist:  Other (Comment Required)([employer]) PT Recommendations:  [place] / Referral to community resources:  [place]  Patient/Family's Response to care:  Patient and  niece   recognize need for rehab before returning home and are agreeable to a  SNF in [place]. They report preference for   [facility] for patient to be with his wife there  . CSW explained insurance authorization process. Patient's family reported that they want patient to get stronger to be able to come back home.    Patient/Family's Understanding of and Emotional Response to Diagnosis, Current Treatment, and Prognosis:  Patient/family is realistic regarding therapy needs and expressed being hopeful for SNF placement. Patient expressed understanding of CSW role and discharge process as well as medical condition. No questions/concerns about plan or treatment.    Emotional Assessment Appearance:  Appears stated age Attitude/Demeanor/Rapport:  Engaged, Gracious Affect (typically observed):  Accepting, Adaptable Orientation:  Oriented to Self, Oriented to Place, Oriented to  Time, Oriented to Situation Alcohol / Substance use:  Not Applicable Psych involvement (Current and /or in the community):  No (Comment)  Discharge Needs  Concerns to be addressed:  Discharge Planning Concerns Readmission within the last 30 days:  No Current discharge risk:  Dependent with Mobility Barriers to Discharge:  Continued Medical Work up   [employer], LCSW 08/04/2018, 11:52 AM

## 2018-08-05 DIAGNOSIS — R5381 Other malaise: Secondary | ICD-10-CM

## 2018-08-05 DIAGNOSIS — R0902 Hypoxemia: Secondary | ICD-10-CM

## 2018-08-05 DIAGNOSIS — J9811 Atelectasis: Secondary | ICD-10-CM

## 2018-08-05 DIAGNOSIS — R918 Other nonspecific abnormal finding of lung field: Secondary | ICD-10-CM

## 2018-08-05 LAB — COMPREHENSIVE METABOLIC PANEL
ALBUMIN: 2.4 g/dL — AB (ref 3.5–5.0)
ALT: 9 U/L (ref 0–44)
AST: 28 U/L (ref 15–41)
Alkaline Phosphatase: 83 U/L (ref 38–126)
Anion gap: 9 (ref 5–15)
BILIRUBIN TOTAL: 0.8 mg/dL (ref 0.3–1.2)
BUN: 37 mg/dL — AB (ref 8–23)
CALCIUM: 8.9 mg/dL (ref 8.9–10.3)
CHLORIDE: 98 mmol/L (ref 98–111)
CO2: 30 mmol/L (ref 22–32)
CREATININE: 1.7 mg/dL — AB (ref 0.61–1.24)
GFR calc Af Amer: 41 mL/min — ABNORMAL LOW (ref >60)
GFR calc non Af Amer: 36 mL/min — ABNORMAL LOW (ref >60)
Glucose, Bld: 132 mg/dL — ABNORMAL HIGH (ref 70–99)
POTASSIUM: 5.3 mmol/L — AB (ref 3.5–5.1)
Sodium: 137 mmol/L (ref 135–145)
Total Protein: 6.3 g/dL — ABNORMAL LOW (ref 6.5–8.1)

## 2018-08-05 LAB — EXPECTORATED SPUTUM ASSESSMENT W GRAM STAIN, RFLX TO RESP C

## 2018-08-05 LAB — CBC
HEMATOCRIT: 29.2 % — AB (ref 39.0–52.0)
Hemoglobin: 9.4 g/dL — ABNORMAL LOW (ref 13.0–17.0)
MCH: 31.9 pg (ref 26.0–34.0)
MCHC: 32.2 g/dL (ref 30.0–36.0)
MCV: 99 fL (ref 80.0–100.0)
NRBC: 0 % (ref 0.0–0.2)
Platelets: 164 10*3/uL (ref 150–400)
RBC: 2.95 MIL/uL — AB (ref 4.22–5.81)
RDW: 14.2 % (ref 11.5–15.5)
WBC: 11.5 10*3/uL — AB (ref 4.0–10.5)

## 2018-08-05 LAB — EXPECTORATED SPUTUM ASSESSMENT W REFEX TO RESP CULTURE

## 2018-08-05 LAB — LACTATE DEHYDROGENASE: LDH: 156 U/L (ref 98–192)

## 2018-08-05 MED ORDER — GUAIFENESIN ER 600 MG PO TB12
1200.0000 mg | ORAL_TABLET | Freq: Two times a day (BID) | ORAL | Status: DC
  Administered 2018-08-05 – 2018-08-10 (×11): 1200 mg via ORAL
  Filled 2018-08-05 (×11): qty 2

## 2018-08-05 MED ORDER — PIPERACILLIN-TAZOBACTAM 3.375 G IVPB
3.3750 g | Freq: Three times a day (TID) | INTRAVENOUS | Status: DC
  Administered 2018-08-05 – 2018-08-06 (×4): 3.375 g via INTRAVENOUS
  Filled 2018-08-05 (×5): qty 50

## 2018-08-05 NOTE — Progress Notes (Signed)
PROGRESS NOTE    Gregory [name]  FBP:[id number] DOB: 1932-11-11 DOA: 07/31/2018 PCP: Christain Sacramento, MD      Brief Narrative:  Gregory [name] is a 84 y.o. M with sCHF, EF 25%, ICD in place, Hypothyroidism, CKD III baseline Cr 1.5-2.0,  BPH, HTN, DM, and macrocytic anemia who presented with mechanical fall, right hip pain.  Found in the ER to have nondisplaced RIGHT intertrochanteric fracture.  Hospitalization now complicated by post-operative pneumonia.  Pulmonology consulted.      Assessment & Plan:  Closed right hip fracture S/p IM nail on 10/10 Cleared by Cardiology preoperatively.   -Consult orthopedics, appreciate cares -Continue Lovenox for DVT prophylaxis   Multifocal left upper and left lower lobe pneumonia Acute hypoxic respiratory failure Over the weekend, patient with progressive sluggishness, new leukocytosis, hypoxia.  Chest x-ray yesterday showed airspace disease in the left, initially thought to be effusion. -thoracentesis attempted but no effusion was seen on ultrasound.   -Follow-up CT scan showed possible left mainstem bronchus occlusion with debris, multifocal pneumonia.  SLP note no overt aspiration, cannot rule out silent aspiration or previous one time event, although no event noted over weekend.  -Continue Zosyn and azithromycin -Supplemental oxygen -SLP consult -Aspiration precautions -Consult pulmonology regarding airway obstruction, possible underlying mass lesion, possible bronchoscopy given airway obstruction   ADDENDUM:  Pulm have seen patient; I agree, risk of deterioration requiring intubation (and subsequently difficult wean) if bronch is very high, would avoid this if possible.  They recommend: -Sputum culture -Serial procals -AGGRESSIVE PULM TOILET -Mucinex -OOB multiple times per day   UTI Pain with foley catheter on 10/14.  Foley replaced, new urine culture growing Pseudomonas.   -Continue Zosyn  Acute on chronic macrocytic  anemia Baseline macrocytosis, unclear cause of baseline anemia. Now with superimposed acute blood loss anemia.    B12 normal.  No clinical bleeding.  Transfused 1 unit on 10/13, appropriate post-transfusion increase in Hgb. Hgb stable since.  -Transfusion threshold 8 g/dL -Continue iron  Delirium Intermittent, none today.  Chronic systolic CHF Hypertenion EF 25% in 2015.  No signs of hypervolemia. -Strict I/Os -Continue amiodarone, statin -Hold carvedilol and furosemide 40 given soft BP -Hold home ramipril given BP  BPH Urinary retention Failed voiding trial over weekend, Foley placed 10/12, cultured on 10/14 with Pseudomonas and now replaced.   -Continue BID Flomax -Voiding trial before discharge  Diabetes No hyperglycemia -Continue SSI -Continue gabapentin  Other medications -Continue eye drops  Hypothyroidism -Continue levothyroxine  CKD IV  Cr no change.       DVT prophylaxis: Lovenox Code Status: FULL Family Communication: Niece by phone MDM and disposition Plan: Below labs and imaging reports reviewed and summarized above.  Medication management as above.  The patient was admitted with hip fracture and postop course was complicated first by blood loss anemia requiring transfusion, as well as by delirium.  Over the last several days he has had worsening clinical status, confusion and hypoxia with new leukocytosis.  Yesterday was discovered to have pneumonia and started on antibiotics for aspiration pneumonia.  Today we will consult pulmonology for guidance regarding bronchoscopy or not.  Likely 2-3 more days IV antibiotics, aggressive pulmonary toilet before stable for discharge to SNF.     Consultants:   Orthopedics  Pulmonology  Procedures:   IM Nail 10/10  Antimicrobials:  Ceftriaxone 10/14  Azithromycin 10/14  Zosyn 10/15 >>  Culture data:  10/14 blood culture x2: NGTD  10/14 Urine culture: Pseudomonas  10/15 Sputum  culture:  pending      Subjective: Feeling better today.  No new fever, vomiting, flank pain, respiratory distress, confusion.  He is more alert.  He has had no new chest pain, vomiting, aspiration events.     Objective: Vitals:   08/04/18 1728 08/04/18 1935 08/05/18 0831 08/05/18 1343  BP: (!) 123/55 (!) 108/55 (!) 108/44 (!) 106/54  Pulse: 73 68 71 68  Resp: 18 18 16 16   Temp: 98.2 F (36.8 C) 98.3 F (36.8 C) 98.4 F (36.9 C) 99.1 F (37.3 C)  TempSrc: Oral  Oral Oral  SpO2: 98% 99% 97% 100%  Weight:      Height:        Intake/Output Summary (Last 24 hours) at 08/05/2018 1416 Last data filed at 08/05/2018 8413 Gross per 24 hour  Intake 555.44 ml  Output 1000 ml  Net -444.56 ml   Filed Weights   07/31/18 2201  Weight: 70.8 kg    Examination: General appearance: Frail elderly adult male, still appears sluggish and weak, no obvious distress. 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  The left eye appears normal.  No nasal deformity, discharge or epistaxis.  Lips dry, dentures in place, oropharynx very dry, no oral lesions.  Hearing diminished.    Skin: All are improved, skin warm and dry. Cardiac: Heart rate regular, soft systolic murmur, JVP normal, no lower extremity edema Respiratory: Respiratory effort shallow, very diminished breath sounds bilaterally, actually do not appreciate rales or egophony anywhere. Abdomen: Abdomen soft without tenderness to palpation, ascites, distention, hepatospleno megaly MSK: No muscular deformities or effusions.  Diffuse loss of subcutaneous muscle mass and fat, thenar wasting. Neuro: Awake and makes eye contact.  Moves both upper extremities with symmetric strength.  He slumped to the left.  His speech is fluent. Psych: Oriented to person place and time, his attention is more alert today, his judgment and insight appear impaired.    Data Reviewed: I have personally reviewed following labs and imaging  studies:  CBC: Recent Labs  Lab 07/31/18 2318  08/02/18 0927 08/03/18 0216 08/03/18 2009 08/04/18 0352 08/05/18 0206  WBC 10.7*   < > 9.5 10.2 11.8* 13.4* 11.5*  NEUTROABS 6.8  --   --   --   --   --   --   HGB 10.8*   < > 8.7* 7.7* 10.2* 9.6* 9.4*  HCT 33.6*   < > 27.0* 23.7*  23.2* 30.7* 30.2* 29.2*  MCV 101.8*   < > 101.9* 101.3* 97.2 98.4 99.0  PLT 175   < > 153 145* 155 152 164   < > = values in this interval not displayed.   Basic Metabolic Panel: Recent Labs  Lab 08/01/18 0518 08/02/18 0927 08/03/18 0216 08/04/18 0352 08/05/18 0206  NA 138 134* 136 135 137  K 5.3* 4.7 4.7 4.9 5.3*  CL 100 100 99 99 98  CO2 29 29 29 29 30   GLUCOSE 163* 123* 123* 174* 132*  BUN 23 20 25* 35* 37*  CREATININE 1.71* 1.55* 1.62* 1.73* 1.70*  CALCIUM 8.7* 8.3* 8.6* 8.5* 8.9   GFR: Estimated Creatinine Clearance: 30.2 mL/min (A) (by C-G formula based on SCr of 1.7 mg/dL (H)). Liver Function Tests: Recent Labs  Lab 08/05/18 0206  AST 28  ALT 9  ALKPHOS 83  BILITOT 0.8  PROT 6.3*  ALBUMIN 2.4*   No results for input(s): LIPASE, AMYLASE in the last 168 hours. No results for input(s): AMMONIA in the  last 168 hours. Coagulation Profile: Recent Labs  Lab 07/31/18 2325  INR 1.17   Cardiac Enzymes: No results for input(s): CKTOTAL, CKMB, CKMBINDEX, TROPONINI in the last 168 hours. BNP (last 3 results) No results for input(s): PROBNP in the last 8760 hours. HbA1C: No results for input(s): HGBA1C in the last 72 hours. CBG: Recent Labs  Lab 08/02/18 2034 08/03/18 0014 08/03/18 0440 08/03/18 1148 08/03/18 1646  GLUCAP 112* 113* 109* 158* 175*   Lipid Profile: No results for input(s): CHOL, HDL, LDLCALC, TRIG, CHOLHDL, LDLDIRECT in the last 72 hours. Thyroid Function Tests: No results for input(s): TSH, T4TOTAL, FREET4, T3FREE, THYROIDAB in the last 72 hours. Anemia Panel: Recent Labs    08/03/18 0216  VITAMINB12 538   Urine analysis:    Component Value  Date/Time   COLORURINE YELLOW 08/04/2018 [place] 08/04/2018 1246   LABSPEC 1.016 08/04/2018 1246   PHURINE 5.0 08/04/2018 1246   GLUCOSEU NEGATIVE 08/04/2018 1246   HGBUR MODERATE (A) 08/04/2018 1246   BILIRUBINUR NEGATIVE 08/04/2018 1246   [place] 08/04/2018 1246   PROTEINUR NEGATIVE 08/04/2018 1246   NITRITE NEGATIVE 08/04/2018 1246   LEUKOCYTESUR TRACE (A) 08/04/2018 1246   Sepsis Labs: @LABRCNTIP (procalcitonin:4,lacticacidven:4)  ) Recent Results (from the past 240 hour(s))  Surgical pcr screen     Status: None   Collection Time: 08/01/18  2:55 AM  Result Value Ref Range Status   MRSA, PCR NEGATIVE NEGATIVE Final   Staphylococcus aureus NEGATIVE NEGATIVE Final    Comment: (NOTE) The Xpert SA Assay (FDA approved for NASAL specimens in patients 70 years of age and older), is one component of a comprehensive surveillance program. It is not intended to diagnose infection nor to guide or monitor treatment. Performed at [facility], [place] [address] [place], [place] [address]   Culture, Urine     Status: Abnormal (Preliminary result)   Collection Time: 08/04/18 12:38 PM  Result Value Ref Range Status   Specimen Description URINE, CATHETERIZED  Final   Special Requests   Final    NONE Performed at [facility], [place] [address], [place], [place] [address]    Culture >=100,000 COLONIES/mL PSEUDOMONAS AERUGINOSA (A)  Final   Report Status PENDING  Incomplete  Expectorated sputum assessment w rflx to resp cult     Status: None   Collection Time: 08/05/18 12:46 PM  Result Value Ref Range Status   Specimen Description SPUTUM  Final   Special Requests NONE  Final   Sputum evaluation   Final    THIS SPECIMEN IS ACCEPTABLE FOR SPUTUM CULTURE Performed at [facility], [place] [address] [place], [place] [address]    Report Status 08/05/2018 FINAL  Final  Culture, respiratory     Status: None (Preliminary result)   Collection Time:  08/05/18 12:46 PM  Result Value Ref Range Status   Specimen Description SPUTUM  Final   Special Requests NONE Reflexed from [id number]  Final   Gram Stain   Final    ABUNDANT WBC PRESENT, PREDOMINANTLY PMN RARE GRAM POSITIVE COCCI IN PAIRS RARE BUDDING YEAST SEEN RARE GRAM POSITIVE RODS Performed at [facility], [place] [address], [place], [place] [address]    Culture PENDING  Incomplete   Report Status PENDING  Incomplete         Radiology Studies: Ct Chest Wo Contrast  Result Date: 08/04/2018 CLINICAL DATA:  Follow-up pneumonia EXAM: CT CHEST WITHOUT CONTRAST TECHNIQUE: Multidetector CT imaging of  the chest was performed following the standard protocol without IV contrast. COMPARISON:  08/04/2018, 07/31/2018 CT chest 06/08/2008 FINDINGS: Cardiovascular: Limited evaluation without intravenous contrast. Nonaneurysmal aorta. Moderate aortic atherosclerosis. Post CABG changes. Coronary vascular calcification. Mild cardiomegaly. No significant pericardial effusion. Mediastinum/Nodes: Midline trachea. Esophagus within normal limits. Prominent mediastinal lymph nodes. Right paratracheal lymph node measures 13 mm. Multiple small subcentimeter prevascular and AP window lymph nodes, though increased as compared with 2009 chest CT. Distal esophageal lymph node measuring 11 mm. Lungs/Pleura: Volume loss with shift of mediastinal contents to the left. No significant pleural effusion. Peripheral pleural thickening at the left lung base, some of which was present on prior. Increased peripheral consolidation in the left upper lobe with pleural thickening present. Multifocal within the left lower lobe and left upper lobe. Abduct termination of left mainstem bronchus. There is expansion and filling of lingular and left lower lobe bronchi. Upper Abdomen: Surgical clips at the gallbladder fossa. No acute abnormality. Fat density in the region of right adrenal gland suggesting adrenal myelolipoma, this measures  about 3.4 cm. Probable partially visualized cyst upper pole left kidney. Musculoskeletal: No acute or suspicious abnormality. Degenerative changes. Post sternotomy changes. IMPRESSION: 1. Mild mediastinal shift to the left consistent with volume loss. Multifocal consolidations within the left upper and lower lobes, suspect for pneumonia. Underlying or associated mass lesion is not excluded given the extensive consolidations present. There is abrupt occlusion of the left lower lobe and lingular bronchi by complex secretions, debris, or tissue. Additional finding of diffuse left pleural thickening, which could be secondary to infection or possible pleural mass. 2. Mediastinal adenopathy which may be reactive or potentially due to metastatic disease. 3. Cardiomegaly 4. Suspected 3.4 cm right adrenal myelolipoma Aortic Atherosclerosis (ICD10-I70.0). Electronically Signed   By: Donavan Foil M.D.   On: 08/04/2018 22:16   Dg Chest Port 1 View  Result Date: 08/04/2018 CLINICAL DATA:  Hypoxia, history of coronary artery disease and previous MI, CHF, former smoker. Recent right hip fracture status post ORIF 3 days ago. EXAM: PORTABLE CHEST 1 VIEW COMPARISON:  Chest x-ray of July 31, 2018 FINDINGS: Since the previous study there has developed a large left pleural effusion. There has also developed increased parenchymal density in the left lung such that only a small amount of aerated lung is present and this lies in the apex. The right lung is well-expanded and clear. The heart borders are obscured. The ICD is in reasonable position. The sternal wires from previous CABG are intact. There is calcification in the wall of the aortic arch. IMPRESSION: Interval development of a large left pleural effusion with associated atelectasis or possible pneumonia. No overt pulmonary edema given the clear right lung. Thoracic aortic atherosclerosis Electronically Signed   By: David  Martinique M.D.   On: 08/04/2018 13:09   Ir US  Chest  Result Date: 08/04/2018 CLINICAL DATA:  Extensive left hemithorax opacification suggesting large effusion. Thoracentesis requested. EXAM: CHEST ULTRASOUND COMPARISON:  Radiography from earlier the same day FINDINGS: There is only a trace left pleural effusion. No safe pocket for thoracentesis. IMPRESSION: Trace left pleural effusion; thoracentesis deferred. Consolidation/atelectasis presumably accounts for much of the opacification of left hemithorax on radiography. CT may be useful for further characterization Electronically Signed   By: Lucrezia Europe M.D.   On: 08/04/2018 16:48        Scheduled Meds: . amiodarone  100 mg Oral q morning - 10a  . atorvastatin  20 mg Oral Daily  . calcium carbonate  1 tablet Oral Daily  . dorzolamide-timolol  1 drop Left Eye Daily  . enoxaparin (LOVENOX) injection  40 mg Subcutaneous Q24H  . ferrous sulfate  325 mg Oral Q breakfast  . gabapentin  300 mg Oral TID  . guaiFENesin  1,200 mg Oral BID  . latanoprost  1 drop Left Eye QHS  . levothyroxine  137 mcg Oral Daily  . multivitamin with minerals  1 tablet Oral Daily  . tamsulosin  0.4 mg Oral BID   Continuous Infusions: . piperacillin-tazobactam (ZOSYN)  IV 3.375 g (08/05/18 1346)     LOS: 4 days    Time spent: 25 minutes    Edwin Dada, MD [facility] 08/05/2018, 2:16 PM     Pager [phone] --- please page though AMION:  [email or web address] Password [email or web address] If 7PM-7AM, please contact night-coverage

## 2018-08-05 NOTE — Consult Note (Addendum)
NAME:  Gregory [name], MRN:  [id number], DOB:  10/24/1932, LOS: 4 ADMISSION DATE:  07/31/2018, CONSULTATION DATE:  08/05/2018 REFERRING MD:  Loleta Books, CHIEF COMPLAINT:  Interval development of  Multifocal consolidations within the left upper and lower lobes,and abrupt occlusion of LLL, post ORIF right hip on 07/31/2018.  Brief History   Gregory [name] is a 84 y.o. M former smoker of pipe and cigars ( Quit 2006) with sCHF, EF 25%, ICD in place, Hypothyroidism, CKD III baseline Cr 1.5-2.0,  BPH, HTN, DM, and macrocytic anemia who presented with mechanical fall, right hip pain.  Found in the ER to have nondisplaced RIGHT intertrochanteric fracture.Pt had Closed right hip fracture s/p IM nail on 10/10. Chest X ray 10/14 showed Interval development of a large left pleural effusion with associated atelectasis or possible pneumonia, clear right lung. CT Chest done 10/14 showed mild mediastinal shift to the left consistent with volume loss. Multifocal consolidations within the left upper and lower lobes, suspect for pneumonia, with concern for underlying mass lesions due to extensive consolidations and  abrupt occlusion of the left lower lobe and lingular bronchi by complex secretions, debris, or tissue.  PCCM have been consulted to assist with management of this new pulmonary finding.  Past Medical History  Former smoker ( Quit 2006) with sCHF, EF 25%, ICD in place, Hypothyroidism, CKD III baseline Cr 1.5-2.0,  BPH, HTN, DM,  macrocytic anemia , and recent hip fracture post fall with surgical repair. Significant Hospital Events   07/31/2018>> Admission with Fx hip  Consults: date of consult/date signed off & final recs:  10/15>> Pulmonary  Procedures (surgical and bedside):    Significant Diagnostic Tests:  08/04/2018>>CT Chest w/o contrast Mild mediastinal shift to the left consistent with volume loss. Multifocal consolidations within the left upper and lower lobes, suspect for pneumonia. Underlying  or associated mass lesion is not excluded given the extensive consolidations present. There is abrupt occlusion of the left lower lobe and lingular bronchi by complex secretions, debris, or tissue. Additional finding of diffuse left pleural thickening, which could be secondary to infection or possible pleural mass. Mediastinal adenopathy which may be reactive or potentially due to metastatic disease. Cardiomegaly Suspected 3.4 cm right adrenal myelolipoma  08/04/2018 CXR  Interval development of a large left pleural effusion with associated atelectasis or possible pneumonia. No overt pulmonary edema given the clear right lung.  Micro Data:  08/04/2018>> Blood 08/04/2018>> Urine 08/05/2018>> Sputum Antimicrobials:  Zosyn 08/05/2018>> Azithromycin 10/14>> 10/15 Rocephin 10/14-10/15  Subjective:  States he is breathing better today 10/15  than yesterday, on his 2 L [place].  States he is coughing up " green , slimy" secretions  Objective   Blood pressure (!) 108/44, pulse 71, temperature 98.4 F (36.9 C), temperature source Oral, resp. rate 16, height 5\' 6"  (1.676 m), weight 70.8 kg, SpO2 97 %.        Intake/Output Summary (Last 24 hours) at 08/05/2018 1046 Last data filed at 08/05/2018 0807 Gross per 24 hour  Intake 615.44 ml  Output 1000 ml  Net -384.56 ml   Filed Weights   07/31/18 2201  Weight: 70.8 kg    Examination: General: Awake and alert, following commands, in NAD on 2 L [place] HENT: NCAT, Eye patch to Right eye, No LAD Lungs: Bilateral chest excursion, Coarse throughout, diminished per R base, Minimal movement noted on Left,  No wheeze, + productive  cough Cardiovascular: S1, S2, RRr, No RMG, BBB per tele,  Abdomen: Soft, NT, ND, BS +,  Body mass index is 25.18 kg/m. Extremities: Dressing to Right hip, No other obvious deformities Neuro: Awake and alert, Oriented x 3 , MAE x 4, Follows commands, conversational GU: Foley Cath with concentrated urine  noted  Resolved Hospital Problem list     Assessment & Plan:  Acute Respiratory Failure 2/2 Interval Development of Multi focal pneumonia  post ORIF Right hip Acute occlusion  LLL  ? Underlying mass lesion/ pleural thickening 2/2/ to mass vs infection Mediastinal Adenopathy reactive vs metastatic disease Recent weight loss in former smoker Plan: ABX as above Oxygen to maintain saturations > 92% Wean as able  Trend CXR for improvement Aggressive Pulmonary Toilet IS Q 1 while awake Flutter Valve 4 puffs every 2 hours Add Mucinex as mucolytic OOB to chair several times a day May consider bronch with BAL  ( Risk vs Benefit)  Cardiovascular sCHF, EF 25%, ICD Soft BP Net + 600cc  since admit Plan: Hold BP meds as ordered for now Trend BNP prn Hold lasix as ordered  Monitor I&O Check Lactate prn   ID  Multi focal Pneumonia Leukocytosis Afebrile Plan: Sputum for Culture Follow  Micro Oxygen as above Continue ABX as above  Trend fever/ WBC Get PCT to guide anti microbial therapy  Renal CKD III baseline Cr 1.5-2.0, Hyperkalemia Plan: Trend BMET/ UO Replete electrolytes as needed Avoid nephrotoxic medications  GI Weight loss Plan: Encourage diet Meal supplements Trend albumin Consider adding SUP    Disposition / Summary of Today's Plan 08/05/18   Per nursing patient is better last 24 hours Continue ABX, Aggressive Pulmonary toilet, OOB to chair Wean oxygen  For  sats > 92% Consider bronch for sputum cx and possible biopsy if he does not continue to improve Currently protecting airway and able to cough up secretions Sputum for Culture PCCM will follow along with you.    Diet: Regular Pain/Anxiety/Delirium protocol (if indicated): Hydrocodone DVT prophylaxis: Lovenox GI prophylaxis:  Hyperglycemia protocol: SSI Mobility: OOB to chair/ PT/OT Code Status: Full Family Communication: Sisters updated at bedside 10/15  Labs   CBC: Recent Labs  Lab  07/31/18 2318  08/02/18 0927 08/03/18 0216 08/03/18 2009 08/04/18 0352 08/05/18 0206  WBC 10.7*   < > 9.5 10.2 11.8* 13.4* 11.5*  NEUTROABS 6.8  --   --   --   --   --   --   HGB 10.8*   < > 8.7* 7.7* 10.2* 9.6* 9.4*  HCT 33.6*   < > 27.0* 23.7*  23.2* 30.7* 30.2* 29.2*  MCV 101.8*   < > 101.9* 101.3* 97.2 98.4 99.0  PLT 175   < > 153 145* 155 152 164   < > = values in this interval not displayed.    Basic Metabolic Panel: Recent Labs  Lab 08/01/18 0518 08/02/18 0927 08/03/18 0216 08/04/18 0352 08/05/18 0206  NA 138 134* 136 135 137  K 5.3* 4.7 4.7 4.9 5.3*  CL 100 100 99 99 98  CO2 29 29 29 29 30   GLUCOSE 163* 123* 123* 174* 132*  BUN 23 20 25* 35* 37*  CREATININE 1.71* 1.55* 1.62* 1.73* 1.70*  CALCIUM 8.7* 8.3* 8.6* 8.5* 8.9   GFR: Estimated Creatinine Clearance: 30.2 mL/min (A) (by C-G formula based on SCr of 1.7 mg/dL (H)). Recent Labs  Lab 08/03/18 0216 08/03/18 2009 08/04/18 0352 08/04/18 1144 08/05/18 0206  WBC 10.2 11.8* 13.4*  --  11.5*  LATICACIDVEN  --   --   --  1.0  --  Liver Function Tests: Recent Labs  Lab 08/05/18 0206  AST 28  ALT 9  ALKPHOS 83  BILITOT 0.8  PROT 6.3*  ALBUMIN 2.4*   No results for input(s): LIPASE, AMYLASE in the last 168 hours. No results for input(s): AMMONIA in the last 168 hours.  ABG    Component Value Date/Time   HCO3 26.5 (H) 11/25/2007 1222   TCO2 28 11/25/2007 1222     Coagulation Profile: Recent Labs  Lab 07/31/18 2325  INR 1.17    Cardiac Enzymes: No results for input(s): CKTOTAL, CKMB, CKMBINDEX, TROPONINI in the last 168 hours.  HbA1C: Hgb A1c MFr Bld  Date/Time Value Ref Range Status  12/31/2011 12:20 PM 9.3 (H) 4.6 - 6.5 % Final    Comment:    Glycemic Control Guidelines for People with Diabetes:Non Diabetic:  <6%Goal of Therapy: <7%Additional Action Suggested:  >8%     CBG: Recent Labs  Lab 08/02/18 2034 08/03/18 0014 08/03/18 0440 08/03/18 1148 08/03/18 1646  GLUCAP  112* 113* 109* 158* 175*    Admitting History of Present Illness.   Gregory [name] is a 84 y.o. M former smoker of pipe and cigars ( Quit 2006) with sCHF, EF 25%, ICD in place, Hypothyroidism, CKD III baseline Cr 1.5-2.0,  BPH, HTN, DM, and macrocytic anemia who presented with mechanical fall, right hip pain.  Found in the ER to have nondisplaced RIGHT intertrochanteric fracture.Pt had Closed right hip fracture s/p IM nail on 10/10. Chest X ray 10/14 showed Interval development of a large left pleural effusion with associated atelectasis or possible pneumonia, clear right lung. CT Chest done 10/14 showed mild mediastinal shift to the left consistent with volume loss. Multifocal consolidations within the left upper and lower lobes, suspect for pneumonia, with concern for underlying mass lesions due to extensive consolidations and  abrupt occlusion of the left lower lobe and lingular bronchi by complex secretions, debris, or tissue.  PCCM have been consulted to assist with management of this new pulmonary finding.   Review of Systems:   Gen: Denies fever, chills, + weight change, + fatigue, night sweats HEENT: Denies blurred vision, double vision, hearing loss, tinnitus, sinus congestion, rhinorrhea, sore throat, neck stiffness, dysphagia PULM: + shortness of breath, + cough, + sputum production,no  hemoptysis, no wheezing CV: Denies chest pain, edema, orthopnea, paroxysmal nocturnal dyspnea, palpitations GI: Denies abdominal pain, nausea, vomiting, diarrhea, hematochezia, melena, constipation, change in bowel habits GU: Denies dysuria, hematuria, polyuria, oliguria, urethral discharge Endocrine: Denies hot or cold intolerance, polyuria, polyphagia or appetite change Derm: Denies rash, dry skin, scaling or peeling skin change Heme: Denies easy bruising, bleeding, bleeding gums Neuro: Denies headache, numbness, + weakness,no slurred speech, loss of memory or consciousness  Past Medical History  He,   has a past medical history of Abnormality of gait (09/19/2016), Allergy, Cataract, CHF (congestive heart failure) ([place]), Colon polyps, Diabetes mellitus, Diabetic neuropathy ([place]), Diverticulosis, Dyslipidemia, Essential hypertension, Glaucoma, Hyperlipidemia, Hypothyroidism, ICD (implantable cardiac defibrillator) in place (11-2004), Internal hemorrhoid, Ischemic cardiomyopathy, Ischemic heart disease, and Myocardial infarction ([place]) (2006).   Surgical History    Past Surgical History:  Procedure Laterality Date  . CARDIAC DEFIBRILLATOR PLACEMENT    . CATARACT EXTRACTION     left  . CHOLECYSTECTOMY    . CHOLECYSTECTOMY, LAPAROSCOPIC    . COLONOSCOPY N/A 12/01/2012   Procedure: COLONOSCOPY;  Surgeon: Irene Shipper, MD;  Location: [facility] ENDOSCOPY;  Service: Endoscopy;  Laterality: N/A;  . CORONARY ARTERY BYPASS GRAFT  12/02/04  .  ESOPHAGOGASTRODUODENOSCOPY N/A 12/01/2012   Procedure: ESOPHAGOGASTRODUODENOSCOPY (EGD);  Surgeon: Irene Shipper, MD;  Location: [facility];  Service: Endoscopy;  Laterality: N/A;  . 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 IMPLANTABLE CARDIOVERTER DEFIBRILLATOR (ICD) GENERATOR CHANGE N/A 03/11/2014   Procedure: ICD GENERATOR CHANGE;  Surgeon: Evans Lance, MD;  Location: [facility] CATH LAB;  Service: Cardiovascular;  Laterality: N/A;  . SHOULDER SURGERY     RIGHT  . TONSILLECTOMY       Social History   Social History   Socioeconomic History  . Marital status: Married    Spouse name: Not on file  . Number of children: 1  . Years of education: 59  . Highest education level: Not on file  Occupational History  . Occupation: retired  Scientific laboratory technician  . Financial resource strain: Not on file  . Food insecurity:    Worry: Not on file    Inability: Not on file  . Transportation needs:    Medical: Not on file    Non-medical: Not on file  Tobacco Use  . Smoking status: Former Smoker    Types: Cigarettes    Last attempt to quit: 10/22/2004    Years  since quitting: 13.7  . Smokeless tobacco: Never Used  . Tobacco comment: quit smoking pipe/cigar in 2006  Substance and Sexual Activity  . Alcohol use: No    Alcohol/week: 0.0 standard drinks  . Drug use: No  . Sexual activity: Not on file  Lifestyle  . Physical activity:    Days per week: Not on file    Minutes per session: Not on file  . Stress: Not on file  Relationships  . Social connections:    Talks on phone: Not on file    Gets together: Not on file    Attends religious service: Not on file    Active member of club or organization: Not on file    Attends meetings of clubs or organizations: Not on file    Relationship status: Not on file  . Intimate partner violence:    Fear of current or ex partner: Not on file    Emotionally abused: Not on file    Physically abused: Not on file    Forced sexual activity: Not on file  Other Topics Concern  . Not on file  Social History Narrative   Lives at home w/ his wife    Left-handed   Caffeine: 1 cup decaf coffee in the morning  ,  reports that he quit smoking about 13 years ago. His smoking use included cigarettes. He has never used smokeless tobacco. He reports that he does not drink alcohol or use drugs.   Family History   His family history includes Cancer in his daughter; Diabetes in his mother and unknown relative; Prostate cancer in his father. There is no history of Colon cancer, Esophageal cancer, Rectal cancer, or Stomach cancer.   Allergies No Known Allergies   Home Medications  Prior to Admission medications   Medication Sig Start Date End Date Taking? Authorizing Provider  acetaminophen (TYLENOL) 500 MG tablet Take 500 mg by mouth every 6 (six) hours as needed. For pain   Yes [provider]  amiodarone (PACERONE) 200 MG tablet TAKE 1/2 TABLET BY MOUTH EVERY MORNING 06/26/18  Yes Josue Hector, MD  aspirin EC 325 MG tablet Take 325 mg by mouth every morning.   Yes [provider]  atorvastatin  (LIPITOR) 20 MG  tablet Take 1 tablet (20 mg total) by mouth daily. 07/23/16  Yes Josue Hector, MD  calcium carbonate (TUMS EX) 750 MG chewable tablet Chew 1 tablet by mouth daily.    Yes [provider]  carvedilol (COREG) 12.5 MG tablet TAKE 1 TABLET (12.5 MG TOTAL) BY MOUTH 2 (TWO) TIMES DAILY WITH A MEAL. 01/17/18  Yes Josue Hector, MD  cetirizine (ZYRTEC) 10 MG tablet Take 10 mg by mouth daily.   Yes [provider]  cholecalciferol (VITAMIN D) 1000 UNITS tablet Take 1,000 Units by mouth daily.   Yes [provider]  diphenhydrAMINE (BENADRYL) 25 mg capsule Take 25 mg by mouth daily as needed for allergies.    Yes [provider]  dorzolamide-timolol (COSOPT) 22.3-6.8 MG/ML ophthalmic solution Place 1 drop into the left eye daily.   Yes [provider]  ferrous sulfate 325 (65 FE) MG tablet Take 325 mg by mouth daily with breakfast.   Yes [provider]  furosemide (LASIX) 40 MG tablet Take 1 tablet (40 mg total) by mouth daily. 11/30/14  Yes Darlin Coco, MD  gabapentin (NEURONTIN) 300 MG capsule Take 300 mg by mouth 3 (three) times daily.    Yes [provider]  latanoprost (XALATAN) 0.005 % ophthalmic solution Place 1 drop into the left eye at bedtime.   Yes [provider]  metFORMIN (GLUCOPHAGE) 500 MG tablet Take 500 mg by mouth 2 (two) times daily.  09/07/12  Yes [provider]  Multiple Vitamins-Minerals (CENTRUM SILVER PO) Take 1 tablet by mouth daily.    Yes [provider]  nitroGLYCERIN (NITROSTAT) 0.4 MG SL tablet Place 1 tablet (0.4 mg total) under the tongue every 5 (five) minutes as needed for chest pain. 05/04/16  Yes Evans Lance, MD  promethazine (PHENERGAN) 25 MG tablet Take 25 mg by mouth every 4 (four) hours as needed. nausea 10/26/15  Yes [provider]  ramipril (ALTACE) 1.25 MG capsule TAKE 1 CAPSULE BY MOUTH EVERY DAY Patient taking differently: Take 1.25 mg by  mouth daily.  09/09/17  Yes Josue Hector, MD  SYNTHROID 137 MCG tablet Take 137 mcg by mouth daily. 10/15/15  Yes [provider]  tamsulosin (FLOMAX) 0.4 MG CAPS capsule Take 0.4 mg by mouth daily after breakfast.  02/02/14  Yes [provider]  temazepam (RESTORIL) 30 MG capsule Take 30 mg by mouth at bedtime as needed for sleep.    Yes [provider]  timolol (TIMOPTIC) 0.25 % ophthalmic solution Place 1 drop into the left eye daily.   Yes [provider]  trifluridine (VIROPTIC) 1 % ophthalmic solution Place 1 drop into the left eye every 2 (two) hours while awake.  07/30/18  Yes [provider]  valACYclovir (VALTREX) 1000 MG tablet Take 1,000 mg by mouth 2 (two) times daily. 07/31/18  Yes [provider]  Inulin (METAMUCIL CLEAR & NATURAL) POWD Take 15 mLs by mouth daily as needed (fiber).  05/18/13   Darlin Coco, MD     Critical care APP  time: 36 minutes     Magdalen Spatz, AGACNP-BC [place] Pager # [phone]  08/05/2018 10:47 AM  Attending Note:  84 year old male s/p hip surgery who presents to PCCM with left lung atelectasis and infiltrate.  Patient is preferentially laying on the left side and is very weak to be able to expectorate sputum.  On exam, decreased BS on the left.  I reviewed chest  CT myself, infiltrate noted with sputum in the left mainstem bronchus.  Discussed with TRH-MD and PCCM-NP.  Atelectasis due to sputum accumulation and weakness  - Aggressive pulmonary toilet  - Chest PT  - Mucinex as a mucolytic  - OOB to chair  - Ambulate as ortho condition will allow  - F/U imaging  - IS  - Flutter valve  - Hope is to avoid bronchoscopy as I would imagine bronchoscopy will result in intubation  PNA:  - Pan culture  - Check PCT  - Abx as ordered  - F/U on cultures  Hypoxemia:  - Titrate O2 for sat of 88-92%  - May need an ambulatory desat study prior to discharge  for ?home O2  Pulmonary edema:  - Diureses as renal function will allow  Decondition:  - Nutrition consult  - PT as able  Patient seen and examined, agree with above note.  I dictated the care and orders written for this patient under my direction.  [name] Farmer, [place]

## 2018-08-05 NOTE — Progress Notes (Signed)
Pharmacy Antibiotic Note  FINDLEY VI is a 84 y.o. male admitted on 07/31/2018 with hip fracture.  Pharmacy has been consulted for Zosyn dosing for aspiration PNA. WBC 11.5. Noted renal dysfunction.   Plan: Zosyn 3.375G IV q8h to be infused over 4 hours Trend WBC, temp, renal function  F/U infectious work-up  Height: 5\' 6"  (167.6 cm) Weight: 156 lb (70.8 kg) IBW/kg (Calculated) : 63.8  Temp (24hrs), Avg:98.3 F (36.8 C), Min:98.2 F (36.8 C), Max:98.4 F (36.9 C)  Recent Labs  Lab 08/01/18 0518 08/02/18 0927 08/03/18 0216 08/03/18 2009 08/04/18 0352 08/04/18 1144 08/05/18 0206  WBC 11.3* 9.5 10.2 11.8* 13.4*  --  11.5*  CREATININE 1.71* 1.55* 1.62*  --  1.73*  --  1.70*  LATICACIDVEN  --   --   --   --   --  1.0  --     Estimated Creatinine Clearance: 30.2 mL/min (A) (by C-G formula based on SCr of 1.7 mg/dL (H)).    No Known Allergies   Narda Bonds 08/05/2018 7:34 AM

## 2018-08-05 NOTE — Evaluation (Signed)
Clinical/Bedside Swallow Evaluation Patient Details  Name: Gregory [name] MRN: [id number] Date of Birth: [birth year]  Today's Date: 08/05/2018 Time: SLP Start Time (ACUTE ONLY): 0840 SLP Stop Time (ACUTE ONLY): 0855 SLP Time Calculation (min) (ACUTE ONLY): 15 min  Past Medical History:  Past Medical History:  Diagnosis Date  . Abnormality of gait 09/19/2016  . Allergy   . Cataract   . CHF (congestive heart failure) ([place])   . Colon polyps   . Diabetes mellitus   . Diabetic neuropathy ([place])   . Diverticulosis   . Dyslipidemia   . Essential hypertension   . Glaucoma   . Hyperlipidemia   . Hypothyroidism   . ICD (implantable cardiac defibrillator) in place 11-2004  . Internal hemorrhoid   . Ischemic cardiomyopathy   . Ischemic heart disease   . Myocardial infarction [facility]) 2006   LARGE ANTERIOR WALL   Past Surgical History:  Past Surgical History:  Procedure Laterality Date  . CARDIAC DEFIBRILLATOR PLACEMENT    . CATARACT EXTRACTION     left  . CHOLECYSTECTOMY    . CHOLECYSTECTOMY, LAPAROSCOPIC    . COLONOSCOPY N/A 12/01/2012   Procedure: COLONOSCOPY;  Surgeon: Irene Shipper, MD;  Location: [facility] ENDOSCOPY;  Service: Endoscopy;  Laterality: N/A;  . CORONARY ARTERY BYPASS GRAFT  12/02/04  . ESOPHAGOGASTRODUODENOSCOPY N/A 12/01/2012   Procedure: ESOPHAGOGASTRODUODENOSCOPY (EGD);  Surgeon: Irene Shipper, MD;  Location: [facility];  Service: Endoscopy;  Laterality: N/A;  . 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 IMPLANTABLE CARDIOVERTER DEFIBRILLATOR (ICD) GENERATOR CHANGE N/A 03/11/2014   Procedure: ICD GENERATOR CHANGE;  Surgeon: Evans Lance, MD;  Location: [facility] CATH LAB;  Service: Cardiovascular;  Laterality: N/A;  . SHOULDER SURGERY     RIGHT  . TONSILLECTOMY     HPI:   Gregory [name] is a 84 y.o. male with history of ischemic cardiomyopathy status post ICD placement and on amiodarone, diabetes mellitus type 2, hypertension, hypothyroidism and  hyperlipidemia was brought from the home after patient had a fall.  pt with right IM  nail.  Pt WBAT   Assessment / Plan / Recommendation Clinical Impression   Pt presents with s/s of grossly intact oropharyngeal swallowing function at bedside.  Pt had no overt s/s of aspiration with solids or liquids, despite being challenged with large consecutive sips via straw and mixed solid and liquid consistencies.  Pt has no history of swallowing dysfunction at baseline.  As a result, no further ST needs are indicated at this time.         Aspiration Risk  No limitations    Diet Recommendation Regular;Thin liquid   Liquid Administration via: Cup;Straw Medication Administration: Whole meds with liquid Supervision: Patient able to self feed Compensations: Slow rate;Small sips/bites Postural Changes: Seated upright at 90 degrees;Remain upright for at least 30 minutes after po intake    Other  Recommendations Oral Care Recommendations: Oral care BID   Follow up Recommendations None        Swallow Study   General Date of Onset: (see HPI) HPI:  Gregory [name] is a 84 y.o. male with history of ischemic cardiomyopathy status post ICD placement and on amiodarone, diabetes mellitus type 2, hypertension, hypothyroidism and hyperlipidemia was brought from the home after patient had a fall.  pt with right IM  nail.  Pt WBAT Type of Study: Bedside Swallow Evaluation Previous Swallow Assessment: none on record Diet Prior to this Study: Regular;Thin liquids Temperature  Spikes Noted: No Respiratory Status: Room air History of Recent Intubation: (for procedure) Behavior/Cognition: Cooperative;Lethargic/Drowsy;Pleasant mood Oral Cavity Assessment: Within Functional Limits Oral Care Completed by SLP: No Oral Cavity - Dentition: Adequate natural dentition Vision: Functional for self-feeding Self-Feeding Abilities: Able to feed self Patient Positioning: Upright in bed Baseline Vocal Quality:  Normal Volitional Cough: Strong Volitional Swallow: Able to elicit    Oral/Motor/Sensory Function Overall Oral Motor/Sensory Function: Within functional limits   Ice Chips     Thin Liquid Thin Liquid: Within functional limits    Nectar Thick     Honey Thick     Puree     Solid     Solid: Within functional limits      Windell Moulding L 08/05/2018,9:02 AM

## 2018-08-05 NOTE — Plan of Care (Signed)
  Problem: Pain Managment: Goal: General experience of comfort will improve Outcome: Progressing   Problem: Safety: Goal: Ability to remain free from injury will improve Outcome: Progressing   

## 2018-08-05 NOTE — Plan of Care (Signed)

## 2018-08-06 ENCOUNTER — Encounter (HOSPITAL_COMMUNITY): Payer: Self-pay | Admitting: *Deleted

## 2018-08-06 ENCOUNTER — Inpatient Hospital Stay (HOSPITAL_COMMUNITY): Payer: PPO

## 2018-08-06 DIAGNOSIS — J189 Pneumonia, unspecified organism: Secondary | ICD-10-CM

## 2018-08-06 DIAGNOSIS — D62 Acute posthemorrhagic anemia: Secondary | ICD-10-CM

## 2018-08-06 DIAGNOSIS — E440 Moderate protein-calorie malnutrition: Secondary | ICD-10-CM

## 2018-08-06 DIAGNOSIS — T83511D Infection and inflammatory reaction due to indwelling urethral catheter, subsequent encounter: Secondary | ICD-10-CM

## 2018-08-06 DIAGNOSIS — N390 Urinary tract infection, site not specified: Secondary | ICD-10-CM

## 2018-08-06 DIAGNOSIS — J9601 Acute respiratory failure with hypoxia: Secondary | ICD-10-CM

## 2018-08-06 LAB — BASIC METABOLIC PANEL
Anion gap: 10 (ref 5–15)
BUN: 41 mg/dL — AB (ref 8–23)
CALCIUM: 8.5 mg/dL — AB (ref 8.9–10.3)
CHLORIDE: 94 mmol/L — AB (ref 98–111)
CO2: 28 mmol/L (ref 22–32)
CREATININE: 1.69 mg/dL — AB (ref 0.61–1.24)
GFR calc non Af Amer: 36 mL/min — ABNORMAL LOW (ref >60)
GFR, EST AFRICAN AMERICAN: 42 mL/min — AB (ref >60)
GLUCOSE: 134 mg/dL — AB (ref 70–99)
Potassium: 4.1 mmol/L (ref 3.5–5.1)
Sodium: 132 mmol/L — ABNORMAL LOW (ref 135–145)

## 2018-08-06 LAB — PROCALCITONIN: Procalcitonin: 0.25 ng/mL

## 2018-08-06 LAB — URINE CULTURE: Culture: >=100000 — AB

## 2018-08-06 LAB — CBC
HEMATOCRIT: 26.3 % — AB (ref 39.0–52.0)
HEMOGLOBIN: 8.5 g/dL — AB (ref 13.0–17.0)
MCH: 32 pg (ref 26.0–34.0)
MCHC: 32.3 g/dL (ref 30.0–36.0)
MCV: 98.9 fL (ref 80.0–100.0)
Platelets: 174 10*3/uL (ref 150–400)
RBC: 2.66 MIL/uL — ABNORMAL LOW (ref 4.22–5.81)
RDW: 14.3 % (ref 11.5–15.5)
WBC: 12.1 10*3/uL — AB (ref 4.0–10.5)
nRBC: 0 % (ref 0.0–0.2)

## 2018-08-06 MED ORDER — ENSURE ENLIVE PO LIQD
237.0000 mL | Freq: Two times a day (BID) | ORAL | Status: DC
  Administered 2018-08-06 – 2018-08-10 (×7): 237 mL via ORAL

## 2018-08-06 MED ORDER — HYDROCODONE-ACETAMINOPHEN 5-325 MG PO TABS
1.0000 | ORAL_TABLET | Freq: Two times a day (BID) | ORAL | Status: DC | PRN
  Administered 2018-08-07 (×2): 1 via ORAL
  Filled 2018-08-06 (×2): qty 1

## 2018-08-06 MED ORDER — PIPERACILLIN-TAZOBACTAM 3.375 G IVPB
3.3750 g | Freq: Three times a day (TID) | INTRAVENOUS | Status: DC
  Administered 2018-08-06 – 2018-08-08 (×7): 3.375 g via INTRAVENOUS
  Filled 2018-08-06 (×8): qty 50

## 2018-08-06 MED ORDER — ACETAMINOPHEN 325 MG PO TABS
650.0000 mg | ORAL_TABLET | Freq: Four times a day (QID) | ORAL | Status: DC | PRN
  Administered 2018-08-07 – 2018-08-10 (×6): 650 mg via ORAL
  Filled 2018-08-06 (×6): qty 2

## 2018-08-06 NOTE — Consult Note (Signed)
NAME:  Gregory [name], MRN:  [id number], DOB:  11/13/1932, LOS: 5 ADMISSION DATE:  07/31/2018, CONSULTATION DATE:  08/05/2018 REFERRING MD:  Loleta Books, CHIEF COMPLAINT:  Interval development of  Multifocal consolidations within the left upper and lower lobes,and abrupt occlusion of LLL, post ORIF right hip on 07/31/2018.  Brief History   Gregory [name] is a 84 y.o. M former smoker of pipe and cigars ( Quit 2006) with sCHF, EF 25%, ICD in place, Hypothyroidism, CKD III baseline Cr 1.5-2.0,  BPH, HTN, DM, and macrocytic anemia who presented with mechanical fall, right hip pain.  Found in the ER to have nondisplaced RIGHT intertrochanteric fracture.Pt had Closed right hip fracture s/p IM nail on 10/10. Chest X ray 10/14 showed Interval development of a large left pleural effusion with associated atelectasis or possible pneumonia, clear right lung. CT Chest done 10/14 showed mild mediastinal shift to the left consistent with volume loss. Multifocal consolidations within the left upper and lower lobes, suspect for pneumonia, with concern for underlying mass lesions due to extensive consolidations and  abrupt occlusion of the left lower lobe and lingular bronchi by complex secretions, debris, or tissue.  PCCM have been consulted to assist with management of this new pulmonary finding.  Past Medical History  Former smoker ( Quit 2006) with sCHF, EF 25%, ICD in place, Hypothyroidism, CKD III baseline Cr 1.5-2.0,  BPH, HTN, DM,  macrocytic anemia , and recent hip fracture post fall with surgical repair. Significant Hospital Events   07/31/2018>> Admission with Fx hip  Consults: date of consult/date signed off & final recs:  10/15>> Pulmonary  Procedures (surgical and bedside):    Significant Diagnostic Tests:  08/04/2018>>CT Chest w/o contrast Mild mediastinal shift to the left consistent with volume loss. Multifocal consolidations within the left upper and lower lobes, suspect for pneumonia. Underlying  or associated mass lesion is not excluded given the extensive consolidations present. There is abrupt occlusion of the left lower lobe and lingular bronchi by complex secretions, debris, or tissue. Additional finding of diffuse left pleural thickening, which could be secondary to infection or possible pleural mass. Mediastinal adenopathy which may be reactive or potentially due to metastatic disease. Cardiomegaly Suspected 3.4 cm right adrenal myelolipoma  08/04/2018 CXR  Interval development of a large left pleural effusion with associated atelectasis or possible pneumonia. No overt pulmonary edema given the clear right lung.  Micro Data:  08/04/2018>> Blood 08/04/2018>> Urine 08/05/2018>> Sputum Antimicrobials:  Zosyn 08/05/2018>> Azithromycin 10/14>> 10/15 Rocephin 10/14-10/15  Subjective:  No events overnight, no new complaints Fluctuating between RA and 2.5 liters intermittently Missed CPT treatment yesterday  Objective   Blood pressure 117/69, pulse 68, temperature 98.5 F (36.9 C), temperature source Oral, resp. rate 18, height 5\' 6"  (1.676 m), weight 70.8 kg, SpO2 98 %.    FiO2 (%):  [30 %] 30 %   Intake/Output Summary (Last 24 hours) at 08/06/2018 1208 Last data filed at 08/06/2018 0623 Gross per 24 hour  Intake 557.78 ml  Output 600 ml  Net -42.22 ml   Filed Weights   07/31/18 2201  Weight: 70.8 kg    Examination: General: Chronically ill appearing male, NAD HENT: [place]/AT, PERRL, EOM-I and MMM Lungs: Minimally audible BS on the left Cardiovascular: RRR, Nl S1/S2 and -M/R/G Abdomen: Soft, NT, ND and +BS Extremities: Dressing to Right hip, No other obvious deformities Neuro: Awake and alert, Oriented x 3 , MAE x 4, Follows commands, conversational Skin: intact except for wound  I reviewed CXR  myself, opacification of the left lung noted  Resolved Hospital Problem list     Assessment & Plan:  84 year old severely physically debilitated man  presenting with atelectasis and infiltrate of the left lung, I favor atelectasis and mucous plugging given body habitus, position and inability to clear secretions.  Discussed with PCCM-NP.  Atelectasis as above:  - Continue aggressive pulmonary toilet  - Insure chest PT is done  - Shift patient position with left lung up and encourage couging  - OOB  - Mucinex  - PT as able  - Will repeat CXR in AM  - IS  - Flutter valve  - Continue to hold of bronchoscopy for now, afraid will need intubation if it is to be done  PNA: Unlikely without fever, WBC stable and PCT of 0.5  - Pan culture, f/u  - Would recommend d/c of abx for HCAP  Hypoxemia: need to understand why patient went back on O2 with stable sats, will discuss with RT  - Titrate O2 for sat of 88-92%  Pulmonary edema:  - Diureses per primary team  Deconditioning:  - Nutrition consult and PT as able  PCCM will f/u on CXR in AM.  Disposition / Summary of Today's Plan 08/06/18   Per nursing patient is better last 24 hours Continue ABX, Aggressive Pulmonary toilet, OOB to chair Wean oxygen  For  sats > 92% Consider bronch for sputum cx and possible biopsy if he does not continue to improve Currently protecting airway and able to cough up secretions Sputum for Culture PCCM will follow along with you.    Diet: Regular Pain/Anxiety/Delirium protocol (if indicated): Hydrocodone DVT prophylaxis: Lovenox GI prophylaxis:  Hyperglycemia protocol: SSI Mobility: OOB to chair/ PT/OT Code Status: Full Family Communication: Sisters updated at bedside 10/15  Labs   CBC: Recent Labs  Lab 07/31/18 2318  08/03/18 0216 08/03/18 2009 08/04/18 0352 08/05/18 0206 08/06/18 0136  WBC 10.7*   < > 10.2 11.8* 13.4* 11.5* 12.1*  NEUTROABS 6.8  --   --   --   --   --   --   HGB 10.8*   < > 7.7* 10.2* 9.6* 9.4* 8.5*  HCT 33.6*   < > 23.7*  23.2* 30.7* 30.2* 29.2* 26.3*  MCV 101.8*   < > 101.3* 97.2 98.4 99.0 98.9  PLT 175   < > 145*  155 152 164 174   < > = values in this interval not displayed.    Basic Metabolic Panel: Recent Labs  Lab 08/02/18 0927 08/03/18 0216 08/04/18 0352 08/05/18 0206 08/06/18 0136  NA 134* 136 135 137 132*  K 4.7 4.7 4.9 5.3* 4.1  CL 100 99 99 98 94*  CO2 29 29 29 30 28   GLUCOSE 123* 123* 174* 132* 134*  BUN 20 25* 35* 37* 41*  CREATININE 1.55* 1.62* 1.73* 1.70* 1.69*  CALCIUM 8.3* 8.6* 8.5* 8.9 8.5*   GFR: Estimated Creatinine Clearance: 30.4 mL/min (A) (by C-G formula based on SCr of 1.69 mg/dL (H)). Recent Labs  Lab 08/03/18 2009 08/04/18 0352 08/04/18 1144 08/05/18 0206 08/06/18 0136  PROCALCITON  --   --   --   --  0.25  WBC 11.8* 13.4*  --  11.5* 12.1*  LATICACIDVEN  --   --  1.0  --   --     Liver Function Tests: Recent Labs  Lab 08/05/18 0206  AST 28  ALT 9  ALKPHOS 83  BILITOT 0.8  PROT 6.3*  ALBUMIN 2.4*   No results for input(s): LIPASE, AMYLASE in the last 168 hours. No results for input(s): AMMONIA in the last 168 hours.  ABG    Component Value Date/Time   HCO3 26.5 (H) 11/25/2007 1222   TCO2 28 11/25/2007 1222     Coagulation Profile: Recent Labs  Lab 07/31/18 2325  INR 1.17    Cardiac Enzymes: No results for input(s): CKTOTAL, CKMB, CKMBINDEX, TROPONINI in the last 168 hours.  HbA1C: Hgb A1c MFr Bld  Date/Time Value Ref Range Status  12/31/2011 12:20 PM 9.3 (H) 4.6 - 6.5 % Final    Comment:    Glycemic Control Guidelines for People with Diabetes:Non Diabetic:  <6%Goal of Therapy: <7%Additional Action Suggested:  >8%     CBG: Recent Labs  Lab 08/02/18 2034 08/03/18 0014 08/03/18 0440 08/03/18 1148 08/03/18 1646  GLUCAP 112* 113* 109* 158* 175*    Admitting History of Present Illness.   Mr. Remo is a 84 y.o. M former smoker of pipe and cigars ( Quit 2006) with sCHF, EF 25%, ICD in place, Hypothyroidism, CKD III baseline Cr 1.5-2.0,  BPH, HTN, DM, and macrocytic anemia who presented with mechanical fall, right hip pain.   Found in the ER to have nondisplaced RIGHT intertrochanteric fracture.Pt had Closed right hip fracture s/p IM nail on 10/10. Chest X ray 10/14 showed Interval development of a large left pleural effusion with associated atelectasis or possible pneumonia, clear right lung. CT Chest done 10/14 showed mild mediastinal shift to the left consistent with volume loss. Multifocal consolidations within the left upper and lower lobes, suspect for pneumonia, with concern for underlying mass lesions due to extensive consolidations and  abrupt occlusion of the left lower lobe and lingular bronchi by complex secretions, debris, or tissue.  PCCM have been consulted to assist with management of this new pulmonary finding.   Review of Systems:   Gen: Denies fever, chills, + weight change, + fatigue, night sweats HEENT: Denies blurred vision, double vision, hearing loss, tinnitus, sinus congestion, rhinorrhea, sore throat, neck stiffness, dysphagia PULM: + shortness of breath, + cough, + sputum production,no  hemoptysis, no wheezing CV: Denies chest pain, edema, orthopnea, paroxysmal nocturnal dyspnea, palpitations GI: Denies abdominal pain, nausea, vomiting, diarrhea, hematochezia, melena, constipation, change in bowel habits GU: Denies dysuria, hematuria, polyuria, oliguria, urethral discharge Endocrine: Denies hot or cold intolerance, polyuria, polyphagia or appetite change Derm: Denies rash, dry skin, scaling or peeling skin change Heme: Denies easy bruising, bleeding, bleeding gums Neuro: Denies headache, numbness, + weakness,no slurred speech, loss of memory or consciousness  Past Medical History  He,  has a past medical history of Abnormality of gait (09/19/2016), Allergy, Cataract, CHF (congestive heart failure) ([place]), Colon polyps, Diabetes mellitus, Diabetic neuropathy ([place]), Diverticulosis, Dyslipidemia, Essential hypertension, Glaucoma, Hyperlipidemia, Hypothyroidism, ICD (implantable cardiac  defibrillator) in place (11-2004), Internal hemorrhoid, Ischemic cardiomyopathy, Ischemic heart disease, and Myocardial infarction ([place]) (2006).   Surgical History    Past Surgical History:  Procedure Laterality Date  . CARDIAC DEFIBRILLATOR PLACEMENT    . CATARACT EXTRACTION     left  . CHOLECYSTECTOMY    . CHOLECYSTECTOMY, LAPAROSCOPIC    . COLONOSCOPY N/A 12/01/2012   Procedure: COLONOSCOPY;  Surgeon: Irene Shipper, MD;  Location: [facility] ENDOSCOPY;  Service: Endoscopy;  Laterality: N/A;  . CORONARY ARTERY BYPASS GRAFT  12/02/04  . ESOPHAGOGASTRODUODENOSCOPY N/A 12/01/2012   Procedure: ESOPHAGOGASTRODUODENOSCOPY (EGD);  Surgeon: Irene Shipper, MD;  Location: [facility];  Service: Endoscopy;  Laterality: N/A;  .  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 IMPLANTABLE CARDIOVERTER DEFIBRILLATOR (ICD) GENERATOR CHANGE N/A 03/11/2014   Procedure: ICD GENERATOR CHANGE;  Surgeon: Evans Lance, MD;  Location: [facility] CATH LAB;  Service: Cardiovascular;  Laterality: N/A;  . SHOULDER SURGERY     RIGHT  . TONSILLECTOMY       Social History   Social History   Socioeconomic History  . Marital status: Married    Spouse name: Not on file  . Number of children: 1  . Years of education: 27  . Highest education level: Not on file  Occupational History  . Occupation: retired  Scientific laboratory technician  . Financial resource strain: Not on file  . Food insecurity:    Worry: Not on file    Inability: Not on file  . Transportation needs:    Medical: Not on file    Non-medical: Not on file  Tobacco Use  . Smoking status: Former Smoker    Types: Cigarettes    Last attempt to quit: 10/22/2004    Years since quitting: 13.7  . Smokeless tobacco: Never Used  . Tobacco comment: quit smoking pipe/cigar in 2006  Substance and Sexual Activity  . Alcohol use: No    Alcohol/week: 0.0 standard drinks  . Drug use: No  . Sexual activity: Not on file  Lifestyle  . Physical activity:    Days per week: Not  on file    Minutes per session: Not on file  . Stress: Not on file  Relationships  . Social connections:    Talks on phone: Not on file    Gets together: Not on file    Attends religious service: Not on file    Active member of club or organization: Not on file    Attends meetings of clubs or organizations: Not on file    Relationship status: Not on file  . Intimate partner violence:    Fear of current or ex partner: Not on file    Emotionally abused: Not on file    Physically abused: Not on file    Forced sexual activity: Not on file  Other Topics Concern  . Not on file  Social History Narrative   Lives at home w/ his wife    Left-handed   Caffeine: 1 cup decaf coffee in the morning  ,  reports that he quit smoking about 13 years ago. His smoking use included cigarettes. He has never used smokeless tobacco. He reports that he does not drink alcohol or use drugs.   Family History   His family history includes Cancer in his daughter; Diabetes in his mother and unknown relative; Prostate cancer in his father. There is no history of Colon cancer, Esophageal cancer, Rectal cancer, or Stomach cancer.   Allergies No Known Allergies   Home Medications  Prior to Admission medications   Medication Sig Start Date End Date Taking? Authorizing Provider  acetaminophen (TYLENOL) 500 MG tablet Take 500 mg by mouth every 6 (six) hours as needed. For pain   Yes [provider]  amiodarone (PACERONE) 200 MG tablet TAKE 1/2 TABLET BY MOUTH EVERY MORNING 06/26/18  Yes Josue Hector, MD  aspirin EC 325 MG tablet Take 325 mg by mouth every morning.   Yes [provider]  atorvastatin (LIPITOR) 20 MG tablet Take 1 tablet (20 mg total) by mouth daily. 07/23/16  Yes Josue Hector, MD  calcium carbonate (TUMS EX) 750 MG chewable tablet Chew  1 tablet by mouth daily.    Yes [provider]  carvedilol (COREG) 12.5 MG tablet TAKE 1 TABLET (12.5 MG TOTAL) BY MOUTH 2 (TWO) TIMES  DAILY WITH A MEAL. 01/17/18  Yes Josue Hector, MD  cetirizine (ZYRTEC) 10 MG tablet Take 10 mg by mouth daily.   Yes [provider]  cholecalciferol (VITAMIN D) 1000 UNITS tablet Take 1,000 Units by mouth daily.   Yes [provider]  diphenhydrAMINE (BENADRYL) 25 mg capsule Take 25 mg by mouth daily as needed for allergies.    Yes [provider]  dorzolamide-timolol (COSOPT) 22.3-6.8 MG/ML ophthalmic solution Place 1 drop into the left eye daily.   Yes [provider]  ferrous sulfate 325 (65 FE) MG tablet Take 325 mg by mouth daily with breakfast.   Yes [provider]  furosemide (LASIX) 40 MG tablet Take 1 tablet (40 mg total) by mouth daily. 11/30/14  Yes Darlin Coco, MD  gabapentin (NEURONTIN) 300 MG capsule Take 300 mg by mouth 3 (three) times daily.    Yes [provider]  latanoprost (XALATAN) 0.005 % ophthalmic solution Place 1 drop into the left eye at bedtime.   Yes [provider]  metFORMIN (GLUCOPHAGE) 500 MG tablet Take 500 mg by mouth 2 (two) times daily.  09/07/12  Yes [provider]  Multiple Vitamins-Minerals (CENTRUM SILVER PO) Take 1 tablet by mouth daily.    Yes [provider]  nitroGLYCERIN (NITROSTAT) 0.4 MG SL tablet Place 1 tablet (0.4 mg total) under the tongue every 5 (five) minutes as needed for chest pain. 05/04/16  Yes Evans Lance, MD  promethazine (PHENERGAN) 25 MG tablet Take 25 mg by mouth every 4 (four) hours as needed. nausea 10/26/15  Yes [provider]  ramipril (ALTACE) 1.25 MG capsule TAKE 1 CAPSULE BY MOUTH EVERY DAY Patient taking differently: Take 1.25 mg by mouth daily.  09/09/17  Yes Josue Hector, MD  SYNTHROID 137 MCG tablet Take 137 mcg by mouth daily. 10/15/15  Yes [provider]  tamsulosin (FLOMAX) 0.4 MG CAPS capsule Take 0.4 mg by mouth daily after breakfast.  02/02/14  Yes [provider]  temazepam (RESTORIL) 30 MG capsule  Take 30 mg by mouth at bedtime as needed for sleep.    Yes [provider]  timolol (TIMOPTIC) 0.25 % ophthalmic solution Place 1 drop into the left eye daily.   Yes [provider]  trifluridine (VIROPTIC) 1 % ophthalmic solution Place 1 drop into the left eye every 2 (two) hours while awake.  07/30/18  Yes [provider]  valACYclovir (VALTREX) 1000 MG tablet Take 1,000 mg by mouth 2 (two) times daily. 07/31/18  Yes [provider]  Inulin (METAMUCIL CLEAR & NATURAL) POWD Take 15 mLs by mouth daily as needed (fiber).  05/18/13   Darlin Coco, MD   [name] Farmer, M.D. [facility] Pulmonary/Critical Care Medicine. Pager: [phone]. After hours pager: [phone].

## 2018-08-06 NOTE — Progress Notes (Signed)
Nutrition Follow-up  DOCUMENTATION CODES:   Non-severe (moderate) malnutrition in context of chronic illness  INTERVENTION:    Continue MVI daily  Ensure Enlive po BID, each supplement provides 350 kcal and 20 grams of protein  NUTRITION DIAGNOSIS:   Moderate Malnutrition related to chronic illness(CHF, CAD) as evidenced by mild fat depletion, moderate fat depletion, mild muscle depletion, moderate muscle depletion.  Ongoing  GOAL:   Patient will meet greater than or equal to 90% of their needs  Unmet  MONITOR:   PO intake, Supplement acceptance, Labs  ASSESSMENT:   84 yo male, admitted after a fall resulting in a hip fracture with surgical repair planned for 10/11. PMHx includes ischemic cardiomyopathy s/p ICD placement, T2DM, HTN, hypothyroidism, HLD, CHF, colon polyps, diverticulosis. Walks with a cane.  Patient eating lunch during RD visit. He states that he needs help with feeding. Nursing staff notified. Patient very [place] and did not understand much of what RD said.   Labs reviewed. Sodium 132 (L), BUN 41 (H), creatinine 1.69 (H) Medications reviewed and include TUMS, ferrous sulfate, MVI, Flomax.   NUTRITION - FOCUSED PHYSICAL EXAM:    Most Recent Value  Orbital Region  Mild depletion  Upper Arm Region  Moderate depletion  Thoracic and Lumbar Region  Unable to assess  Buccal Region  Mild depletion  Temple Region  Mild depletion  Clavicle Bone Region  Mild depletion  Clavicle and Acromion Bone Region  Mild depletion  Scapular Bone Region  Mild depletion  Dorsal Hand  Moderate depletion  Patellar Region  Moderate depletion  Anterior Thigh Region  Moderate depletion  Posterior Calf Region  Moderate depletion  Edema (RD Assessment)  None  Hair  Reviewed  Eyes  Reviewed  Mouth  Reviewed  Skin  Reviewed  Nails  Reviewed       Diet Order:   Diet Order            Diet regular Room service appropriate? Yes; Fluid consistency: Thin  Diet effective now               EDUCATION NEEDS:   No education needs have been identified at this time  Skin:  Skin Assessment: Skin Integrity Issues: Skin Integrity Issues:: Incisions Incisions: R hip surgical incision Other: ecchymosis  Last BM:  10/15  Height:   Ht Readings from Last 1 Encounters:  07/31/18 5\' 6"  (1.676 m)    Weight:   Wt Readings from Last 1 Encounters:  07/31/18 70.8 kg    Ideal Body Weight:  64.5 kg  BMI:  Body mass index is 25.18 kg/m.  Estimated Nutritional Needs:   Kcal:  1600-1800  Protein:  80-90 gm  Fluid:  1.6-1.8 L    Molli Barrows, RD, LDN, [place] Pager [phone] After Hours Pager [phone]

## 2018-08-06 NOTE — Progress Notes (Signed)
RT unavailable for 0400 CPT. Will resume CPT at 0800.

## 2018-08-06 NOTE — Progress Notes (Signed)
Occupational Therapy Treatment Patient Details Name: Gregory [name] MRN: [id number] DOB: [birth year] Today's Date: 08/06/2018    History of present illness  CHARELS [name] is a 84 y.o. male with history of ischemic cardiomyopathy status post ICD placement and on amiodarone, diabetes mellitus type 2, hypertension, hypothyroidism and hyperlipidemia was brought from the home after patient had a fall.  pt with right IM  nail.  Pt WBAT   OT comments  Pt progressing towards OT goals this session. Focus was grooming tasks and education on AE for LB ADL. Current POC remains appropriate and Pt continues to require SNF level care at DC.   Follow Up Recommendations  SNF;Supervision/Assistance - 24 hour    Equipment Recommendations  Other (comment)(defer to next venue)    Recommendations for Other Services      Precautions / Restrictions Precautions Precautions: Fall Restrictions Weight Bearing Restrictions: Yes RLE Weight Bearing: Weight bearing as tolerated       Mobility Bed Mobility Overal bed mobility: Needs Assistance Bed Mobility: Supine to Sit     Supine to sit: HOB elevated;Max assist;+2 for physical assistance     General bed mobility comments: Pt in recliner at beginning and end of session  Transfers Overall transfer level: Needs assistance   Transfers: Sit to/from Stand;Stand Pivot Transfers Sit to Stand: Mod assist;+2 physical assistance Stand pivot transfers: +2 physical assistance;Total assist       General transfer comment: NT this session    Balance Overall balance assessment: Needs assistance Sitting-balance support: Feet supported Sitting balance-Leahy Scale: Fair     Standing balance support: Bilateral upper extremity supported Standing balance-Leahy Scale: Poor Standing balance comment: UE support and min A for balance while standing for hygiene about 2-3 minutes                           ADL either performed or assessed with  clinical judgement   ADL Overall ADL's : Needs assistance/impaired Eating/Feeding: Set up;Supervision/ safety;Sitting Eating/Feeding Details (indicate cue type and reason): at end of session Grooming: Wash/dry hands;Oral care;Wash/dry face;Set up;Sitting Grooming Details (indicate cue type and reason): in recliner                               General ADL Comments: Pt edcuated on AE for LB independence in ADL - unsure of how much was retained. demonstrated for Pt     Vision       Perception     Praxis      Cognition Arousal/Alertness: Awake/alert Behavior During Therapy: WFL for tasks assessed/performed Overall Cognitive Status: Difficult to assess                                          Exercises Total Joint Exercises Ankle Circles/Pumps: AROM;10 reps;Both;Seated Quad Sets: Right;Seated;10 reps;AROM Heel Slides: AAROM;Right;Seated;5 reps Other Exercises Other Exercises: seated in chair incentive spirometer x 5 (up to 5104ml) then flutter valve x 5   Shoulder Instructions       General Comments      Pertinent Vitals/ Pain       Pain Assessment: 0-10 Pain Score: 9  Faces Pain Scale: Hurts whole lot Pain Location: R hip Pain Descriptors / Indicators: Aching;Grimacing;Guarding;Operative site guarding Pain Intervention(s): Limited activity within patient's tolerance;Monitored during session(Pt declined ice)  Home Living                                          Prior Functioning/Environment              Frequency  Min 2X/week        Progress Toward Goals  OT Goals(current goals can now be found in the care plan section)  Progress towards OT goals: Progressing toward goals  Acute Rehab OT Goals Patient Stated Goal: to stop hurting OT Goal Formulation: With patient Time For Goal Achievement: 08/16/18 Potential to Achieve Goals: Good  Plan Discharge plan remains appropriate;Frequency remains appropriate     Co-evaluation                 AM-PAC PT "6 Clicks" Daily Activity     Outcome Measure   Help from another person eating meals?: None Help from another person taking care of personal grooming?: A Little Help from another person toileting, which includes using toliet, bedpan, or urinal?: A Lot Help from another person bathing (including washing, rinsing, drying)?: A Lot Help from another person to put on and taking off regular upper body clothing?: A Little Help from another person to put on and taking off regular lower body clothing?: A Lot 6 Click Score: 16    End of Session    OT Visit Diagnosis: Unsteadiness on feet (R26.81);Other abnormalities of gait and mobility (R26.89);Muscle weakness (generalized) (M62.81);History of falling (Z91.81);Pain Pain - Right/Left: Right Pain - part of body: Leg   Activity Tolerance Patient tolerated treatment well   Patient Left in chair;with call bell/phone within reach;with chair alarm set   Nurse Communication Mobility status;Precautions;Weight bearing status        Time: 5956-3875 OT Time Calculation (min): 23 min  Charges: OT General Charges $OT Visit: 1 Visit OT Treatments $Self Care/Home Management : 8-22 mins  Hulda Humphrey OTR/L Acute Rehabilitation Services Pager: [phone] Office: [place] 08/06/2018, 3:05 PM

## 2018-08-06 NOTE — Care Management Important Message (Signed)
Important Message  Patient Details  Name: Gregory [name] MRN: [id number] Date of Birth: [birth year]   Medicare Important Message Given:  Yes    Abbi Mancini [address] 08/06/2018, 11:41 AM

## 2018-08-06 NOTE — Progress Notes (Signed)
Physical Therapy Treatment Patient Details Name: Gregory [name] MRN: [id number] DOB: Oct 05, 1933 Today's Date: 08/06/2018    History of Present Illness  Gregory [name] is a 84 y.o. male with history of ischemic cardiomyopathy status post ICD placement and on amiodarone, diabetes mellitus type 2, hypertension, hypothyroidism and hyperlipidemia was brought from the home after patient had a fall.  pt with right IM  nail.  Pt WBAT    PT Comments    Patient progressing this session especially in terms of cognition able to follow commands when understood due to severe hearing deficit and reports his batteries not working in hearing aides.  Stood several minutes for hygiene due to fecal incontinence.  And able to participate well in seated therex.  Feel patient remains appropriate for SNF level rehab upon d/c.   Follow Up Recommendations  SNF     Equipment Recommendations  None recommended by PT    Recommendations for Other Services       Precautions / Restrictions Precautions Precautions: Fall Restrictions RLE Weight Bearing: Weight bearing as tolerated    Mobility  Bed Mobility Overal bed mobility: Needs Assistance Bed Mobility: Supine to Sit     Supine to sit: HOB elevated;Max assist;+2 for physical assistance     General bed mobility comments: initially cues for using L LE to boost hips, but needed assist to scoot hips and lift trunk and support R LE  Transfers Overall transfer level: Needs assistance   Transfers: Sit to/from Stand;Stand Pivot Transfers Sit to Stand: Mod assist;+2 physical assistance Stand pivot transfers: +2 physical assistance;Total assist       General transfer comment: lifting help from EOB using pad under pt, stood for hygiene due to incontinent of stool, used of stedy with pt sitting for pivot to chair  Ambulation/Gait             General Gait Details: NT   Stairs             Wheelchair Mobility    Modified Rankin (Stroke  Patients Only)       Balance Overall balance assessment: Needs assistance Sitting-balance support: Feet supported Sitting balance-Leahy Scale: Fair     Standing balance support: Bilateral upper extremity supported Standing balance-Leahy Scale: Poor Standing balance comment: UE support and min A for balance while standing for hygiene about 2-3 minutes                            Cognition Arousal/Alertness: Awake/alert Behavior During Therapy: WFL for tasks assessed/performed Overall Cognitive Status: Difficult to assess                                        Exercises Total Joint Exercises Ankle Circles/Pumps: AROM;10 reps;Both;Seated Quad Sets: Right;Seated;10 reps;AROM Heel Slides: AAROM;Right;Seated;5 reps Other Exercises Other Exercises: seated in chair incentive spirometer x 5 (up to 577ml) then flutter valve x 5    General Comments        Pertinent Vitals/Pain Faces Pain Scale: Hurts whole lot Pain Location: right hip with mobility Pain Descriptors / Indicators: Aching;Grimacing;Guarding;Operative site guarding Pain Intervention(s): Monitored during session;Repositioned;Limited activity within patient's tolerance    Home Living                      Prior Function  PT Goals (current goals can now be found in the care plan section) Progress towards PT goals: Progressing toward goals    Frequency    Min 3X/week      PT Plan Current plan remains appropriate    Co-evaluation              AM-PAC PT "6 Clicks" Daily Activity  Outcome Measure  Difficulty turning over in bed (including adjusting bedclothes, sheets and blankets)?: Unable Difficulty moving from lying on back to sitting on the side of the bed? : Unable Difficulty sitting down on and standing up from a chair with arms (e.g., wheelchair, bedside commode, etc,.)?: Unable Help needed moving to and from a bed to chair (including a wheelchair)?:  A Lot Help needed walking in hospital room?: Total Help needed climbing 3-5 steps with a railing? : Total 6 Click Score: 7    End of Session Equipment Utilized During Treatment: Gait belt Activity Tolerance: Patient limited by pain Patient left: with call bell/phone within reach;in chair;with chair alarm set Nurse Communication: Mobility status PT Visit Diagnosis: Other abnormalities of gait and mobility (R26.89);History of falling (Z91.81);Difficulty in walking, not elsewhere classified (R26.2);Pain Pain - Right/Left: Right Pain - part of body: Hip     Time: 2671-2458 PT Time Calculation (min) (ACUTE ONLY): 43 min  Charges:  $Therapeutic Exercise: 8-22 mins $Therapeutic Activity: 23-37 mins                     Magda Kiel, [facility] [phone] 08/06/2018    Reginia Naas 08/06/2018, 1:24 PM

## 2018-08-06 NOTE — Progress Notes (Signed)
NAME:  Gregory [name], MRN:  [id number], DOB:  1932-11-21, LOS: 5 ADMISSION DATE:  07/31/2018, CONSULTATION DATE:  08/05/2018 REFERRING MD:  Loleta Books, CHIEF COMPLAINT:  Interval development of  Multifocal consolidations within the left upper and lower lobes,and abrupt occlusion of LLL, post ORIF right hip on 07/31/2018.  Brief History   Gregory [name] is a 84 y.o. M former smoker of pipe and cigars ( Quit 2006) with sCHF, EF 25%, ICD in place, Hypothyroidism, CKD III baseline Cr 1.5-2.0,  BPH, HTN, DM, and macrocytic anemia who presented with mechanical fall, right hip pain.  Found in the ER to have nondisplaced RIGHT intertrochanteric fracture.Pt had Closed right hip fracture s/p IM nail on 10/10. Chest X ray 10/14 showed Interval development of a large left pleural effusion with associated atelectasis or possible pneumonia, clear right lung. CT Chest done 10/14 showed mild mediastinal shift to the left consistent with volume loss. Multifocal consolidations within the left upper and lower lobes, suspect for pneumonia, with concern for underlying mass lesions due to extensive consolidations and  abrupt occlusion of the left lower lobe and lingular bronchi by complex secretions, debris, or tissue.  PCCM have been consulted to assist with management of this new pulmonary finding.  Past Medical History  Former smoker ( Quit 2006) with sCHF, EF 25%, ICD in place, Hypothyroidism, CKD III baseline Cr 1.5-2.0,  BPH, HTN, DM,  macrocytic anemia , and recent hip fracture post fall with surgical repair. Significant Hospital Events   07/31/2018>> Admission with Fx hip  Consults: date of consult/date signed off & final recs:  10/15>> Pulmonary  Procedures (surgical and bedside):    Significant Diagnostic Tests:  08/04/2018>>CT Chest w/o contrast Mild mediastinal shift to the left consistent with volume loss. Multifocal consolidations within the left upper and lower lobes, suspect for pneumonia. Underlying  or associated mass lesion is not excluded given the extensive consolidations present. There is abrupt occlusion of the left lower lobe and lingular bronchi by complex secretions, debris, or tissue. Additional finding of diffuse left pleural thickening, which could be secondary to infection or possible pleural mass. Mediastinal adenopathy which may be reactive or potentially due to metastatic disease. Cardiomegaly Suspected 3.4 cm right adrenal myelolipoma  08/04/2018 CXR  Interval development of a large left pleural effusion with associated atelectasis or possible pneumonia. No overt pulmonary edema given the clear right lung.  Micro Data:  08/04/2018>> Blood 08/04/2018>> Urine 08/05/2018>> Sputum Antimicrobials:  Zosyn 08/05/2018>> Azithromycin 10/14>> 10/15 Rocephin 10/14-10/15  Subjective:  Breathing better.  Wants to go home.  Using IS and flutter.  Minimal sputum production    Objective   Blood pressure 117/69, pulse 68, temperature 98.5 F (36.9 C), temperature source Oral, resp. rate 18, height 5\' 6"  (1.676 m), weight 70.8 kg, SpO2 98 %.    FiO2 (%):  [30 %] 30 %   Intake/Output Summary (Last 24 hours) at 08/06/2018 1215 Last data filed at 08/06/2018 4098 Gross per 24 hour  Intake 557.78 ml  Output 600 ml  Net -42.22 ml   Filed Weights   07/31/18 2201  Weight: 70.8 kg    Examination: General: pleasant, chronically ill appearing male, NAD in chair  HENT: NCAT, Eye patch to Right eye, No LAD Lungs: resps even non labored on [place], diminished L, few scattered rhonchi R   Cardiovascular: S1, S2, RRr, No RMG, BBB per tele,  Abdomen:soft, non tender  Extremities: Dressing to Right hip, No other obvious deformities Neuro: Awake and alert, Oriented  x 3 , MAE x 4, Follows commands, conversational  Resolved Hospital Problem list     Assessment & Plan:    Acute hypoxic Respiratory Failure r/t atx/collapse in setting PNA with mucous plugging and  weakness/deconditioning  ? Underlying mass lesion/ pleural thickening 2/2/ to mass vs infection Mediastinal Adenopathy reactive vs metastatic disease Recent weight loss in former smoker Mild pulmonary edema  Plan: Aggressive pulmonary hygiene  Chest PT q1h IS  Flutter valve q2h  Mucolytic  Mobilize - OOB to chair and ambulation as ortho will allow  F/u CXR  ABX as above  Supplemental O2 as needed to keep sats >92% May need ambulatory desat study prior to d/c  F/u CXR  Follow sputum culture  If no improvement may need to consider FOB with BAL -- hold for now with clinical improvement  Diuresis as Scr and BP tolerate     Disposition / Summary of Today's Plan 08/06/18   Improving clinically but not much improvement on CXR.  Looks good OOB in chair.   Continue abx, aggressive pulmonary hygiene F/u CXR  Follow culture data  Hoping to avoid FOB      Diet: Regular Pain/Anxiety/Delirium protocol (if indicated): Hydrocodone DVT prophylaxis: Lovenox GI prophylaxis:  Hyperglycemia protocol: SSI Mobility: OOB to chair/ PT/OT Code Status: Full Family Communication: no family at bedside on NP rounds 10/16  Labs   CBC: Recent Labs  Lab 07/31/18 2318  08/03/18 0216 08/03/18 2009 08/04/18 0352 08/05/18 0206 08/06/18 0136  WBC 10.7*   < > 10.2 11.8* 13.4* 11.5* 12.1*  NEUTROABS 6.8  --   --   --   --   --   --   HGB 10.8*   < > 7.7* 10.2* 9.6* 9.4* 8.5*  HCT 33.6*   < > 23.7*  23.2* 30.7* 30.2* 29.2* 26.3*  MCV 101.8*   < > 101.3* 97.2 98.4 99.0 98.9  PLT 175   < > 145* 155 152 164 174   < > = values in this interval not displayed.    Basic Metabolic Panel: Recent Labs  Lab 08/02/18 0927 08/03/18 0216 08/04/18 0352 08/05/18 0206 08/06/18 0136  NA 134* 136 135 137 132*  K 4.7 4.7 4.9 5.3* 4.1  CL 100 99 99 98 94*  CO2 29 29 29 30 28   GLUCOSE 123* 123* 174* 132* 134*  BUN 20 25* 35* 37* 41*  CREATININE 1.55* 1.62* 1.73* 1.70* 1.69*  CALCIUM 8.3* 8.6* 8.5* 8.9  8.5*   GFR: Estimated Creatinine Clearance: 30.4 mL/min (A) (by C-G formula based on SCr of 1.69 mg/dL (H)). Recent Labs  Lab 08/03/18 2009 08/04/18 0352 08/04/18 1144 08/05/18 0206 08/06/18 0136  PROCALCITON  --   --   --   --  0.25  WBC 11.8* 13.4*  --  11.5* 12.1*  LATICACIDVEN  --   --  1.0  --   --     Liver Function Tests: Recent Labs  Lab 08/05/18 0206  AST 28  ALT 9  ALKPHOS 83  BILITOT 0.8  PROT 6.3*  ALBUMIN 2.4*   No results for input(s): LIPASE, AMYLASE in the last 168 hours. No results for input(s): AMMONIA in the last 168 hours.  ABG    Component Value Date/Time   HCO3 26.5 (H) 11/25/2007 1222   TCO2 28 11/25/2007 1222     Coagulation Profile: Recent Labs  Lab 07/31/18 2325  INR 1.17    Cardiac Enzymes: No results for input(s): CKTOTAL, CKMB, CKMBINDEX,  TROPONINI in the last 168 hours.  HbA1C: Hgb A1c MFr Bld  Date/Time Value Ref Range Status  12/31/2011 12:20 PM 9.3 (H) 4.6 - 6.5 % Final    Comment:    Glycemic Control Guidelines for People with Diabetes:Non Diabetic:  <6%Goal of Therapy: <7%Additional Action Suggested:  >8%     CBG: Recent Labs  Lab 08/02/18 2034 08/03/18 0014 08/03/18 0440 08/03/18 1148 08/03/18 1646  GLUCAP 112* 113* 109* [place], NP 08/06/2018  12:15 PM Pager: [phone] or [phone]

## 2018-08-06 NOTE — Progress Notes (Addendum)
PROGRESS NOTE    Gregory [name]  JHE:[id number] DOB: Jun 18, 1933 DOA: 07/31/2018 PCP: Christain Sacramento, MD      Brief Narrative:  Gregory [name] is a 84 y.o. M with chronic systolic CHF, EF 18%, ICD in place, Hypothyroidism, CKD III baseline Cr 1.5-2.0,  BPH, HTN, DM, and macrocytic anemia who presented with mechanical fall, right hip pain.  Found in the ER to have nondisplaced RIGHT intertrochanteric fracture.  Status post IM nail 10/10. Hospitalization now complicated by post-operative pneumonia versus atelectasis due to mucous plugging and hypoxia.  Pulmonology consulted.  Improving.  Possible DC to SNF 10/17.   Assessment & Plan:  Closed right hip fracture, S/p IM nail on 10/10 Patient underwent procedure after cardiology preop clearance.  Orthopedics assisting with care.  They recommend WBAT RLE, Lovenox for DVT prophylaxis.  Outpatient follow-up with orthopedics.  Atelectasis versus HCAP (felt less likely) complicated by acute hypoxic respiratory failure - Over the weekend, patient developed progressive sluggishness, new leukocytosis, hypoxia.  Chest x-ray yesterday showed airspace disease in the left, initially thought to be effusion. - thoracentesis attempted but no significant tappable effusion was seen on ultrasound.   - Follow-up CT scan showed possible left mainstem bronchus occlusion with debris, multifocal pneumonia. - SLP note no overt aspiration, cannot rule out silent aspiration or previous one time event, although no event noted over weekend. -Treating empirically with Zosyn and azithromycin - He had been weaned to room air yesterday but back on oxygen despite saturations in the 90s.  Monitor off of oxygen and aim for oxygen saturation 88-92%. - Aspiration precautions - Pulmonary consultation and follow-up appreciated.  They suspect atelectasis and mucous plugging given body habitus, position and inability to clear secretions rather than pneumonia. - Continue aggressive  pulmonary toilet, chest PT, mobilize, incentive spirometer, flutter valve and follow-up chest x-ray in a.m.  Continuing to hold off bronchoscopy for now due to concern that he may need intubation if it is done. - I discussed with Dr. Nelda Marseille, PCCM: Recommends continuing IV antibiotics for today then consider stopping, given complete white out of left lung fields (I personally reviewed chest x-ray from today) will need to follow-up chest x-ray in a.m. to determine further course of action.  Pseudomonas CAUTI Pain with foley catheter on 10/14.  Foley replaced, new urine culture growing Pseudomonas.   -Day 2 IV Zosyn.  Acute on chronic macrocytic anemia Baseline macrocytosis, unclear cause of baseline anemia. Now with superimposed acute blood loss anemia.   B12 normal.  No clinical bleeding.  Transfused 1 unit on 10/13, appropriate post-transfusion increase in Hgb.  Hemoglobin has gradually drifted down to 8.5. -Transfusion threshold 8 g/dL -Continue iron  Delirium Intermittent.  Currently resolved.  May also be due to hard of hearing.  Chronic systolic CHF Hypertenion EF 25% in 2015.  No signs of hypervolemia. -Strict I/Os -Continue amiodarone, statin -Holding carvedilol, ramipril and furosemide 40 given soft BP -Consider resuming these at discharge BP permitting.  Blood pressure controlled.  BPH Urinary retention Failed voiding trial over weekend, Foley placed 10/12, cultured on 10/14 with Pseudomonas and now replaced.   -Continue BID Flomax -As per RN, failed voiding trial again on 10/15.  Will need to discharge to SNF with Foley catheter and outpatient follow-up with urology in 2 weeks.  Diabetes type II No hyperglycemia -Continue SSI.  Mildly uncontrolled and fluctuating. -Continue gabapentin  Other medications -Continue eye drops  Hypothyroidism -Continue levothyroxine  CKD IV  Cr no change.  Creatinine stable  in the 1.6-1.7 range.  Hard of hearing States that his  hearing aid has run out of battery and needs replacement.  Right eye blindness/enucleated.   DVT prophylaxis: Lovenox Code Status: FULL Family Communication: None at bedside MDM and disposition Plan: DC to SNF pending clinical improvement.  As discussed with PCCM today, given white out of left lung fields on chest x-ray today, ongoing risk of decline/respiratory failure requiring intubation, reassess chest x-ray in a.m. to determine further course of action.     Consultants:   Orthopedics  Pulmonology  Procedures:   IM Nail 10/10  Antimicrobials:  Ceftriaxone 10/14  Azithromycin 10/14  Zosyn 10/15 >>  Culture data:  10/14 blood culture x2: NGTD  10/14 Urine culture: Pseudomonas  10/15 Sputum culture: pending      Subjective: Extremely hard of hearing.  States that his hearing aid battery needs changed.  Reports dyspnea is improved but indicates that his breathing is not yet at baseline.  Denies cough or chest pain.  Objective: Vitals:   08/05/18 2300 08/06/18 0404 08/06/18 0747 08/06/18 1106  BP:  117/69    Pulse:  (!) 59 74 68  Resp:  20 18 18   Temp:  98.5 F (36.9 C)    TempSrc:  Oral    SpO2: 96% 100% 92% 98%  Weight:      Height:        Intake/Output Summary (Last 24 hours) at 08/06/2018 1337 Last data filed at 08/06/2018 4259 Gross per 24 hour  Intake 317.78 ml  Output 600 ml  Net -282.22 ml   Filed Weights   07/31/18 2201  Weight: 70.8 kg    Examination: General appearance: Pleasant elderly male, moderately built and nourished, lying comfortably propped up in bed without distress.  Has right hearing aid. Eyes: The right eye is atrophied, this is congenital.  Has a patch over right eye. The left eye appears normal.   Cardiac: Heart rate regular, soft systolic murmur, JVP normal, no lower extremity edema Respiratory: Reduced breath sounds left lung fields and in right base.  No wheezing, rhonchi or crackles appreciated.  No increased  work of breathing but appears slightly dyspneic with talking. Abdomen: Abdomen soft without tenderness to palpation, ascites, distention, hepatospleno megaly.  Normal bowel sounds heard. Neuro: Alert and oriented.  No focal neurological deficits. Psych: Judgment and insight appear impaired.  Mood and affect appear appropriate. Extremities: Symmetric 5 x 5 power.  Right hip surgical site without acute findings. GU: Foley catheter +.    Data Reviewed: I have personally reviewed following labs and imaging studies:  CBC: Recent Labs  Lab 07/31/18 2318  08/03/18 0216 08/03/18 2009 08/04/18 0352 08/05/18 0206 08/06/18 0136  WBC 10.7*   < > 10.2 11.8* 13.4* 11.5* 12.1*  NEUTROABS 6.8  --   --   --   --   --   --   HGB 10.8*   < > 7.7* 10.2* 9.6* 9.4* 8.5*  HCT 33.6*   < > 23.7*  23.2* 30.7* 30.2* 29.2* 26.3*  MCV 101.8*   < > 101.3* 97.2 98.4 99.0 98.9  PLT 175   < > 145* 155 152 164 174   < > = values in this interval not displayed.   Basic Metabolic Panel: Recent Labs  Lab 08/02/18 0927 08/03/18 0216 08/04/18 0352 08/05/18 0206 08/06/18 0136  NA 134* 136 135 137 132*  K 4.7 4.7 4.9 5.3* 4.1  CL 100 99 99 98 94*  CO2 29  29 29 30 28   GLUCOSE 123* 123* 174* 132* 134*  BUN 20 25* 35* 37* 41*  CREATININE 1.55* 1.62* 1.73* 1.70* 1.69*  CALCIUM 8.3* 8.6* 8.5* 8.9 8.5*   GFR: Estimated Creatinine Clearance: 30.4 mL/min (A) (by C-G formula based on SCr of 1.69 mg/dL (H)). Liver Function Tests: Recent Labs  Lab 08/05/18 0206  AST 28  ALT 9  ALKPHOS 83  BILITOT 0.8  PROT 6.3*  ALBUMIN 2.4*   Coagulation Profile: Recent Labs  Lab 07/31/18 2325  INR 1.17   CBG: Recent Labs  Lab 08/02/18 2034 08/03/18 0014 08/03/18 0440 08/03/18 1148 08/03/18 1646  GLUCAP 112* 113* 109* 158* 175*   Urine analysis:    Component Value Date/Time   COLORURINE YELLOW 08/04/2018 [place] 08/04/2018 1246   LABSPEC 1.016 08/04/2018 1246   PHURINE 5.0 08/04/2018  1246   GLUCOSEU NEGATIVE 08/04/2018 1246   HGBUR MODERATE (A) 08/04/2018 1246   BILIRUBINUR NEGATIVE 08/04/2018 1246   [place] 08/04/2018 1246   PROTEINUR NEGATIVE 08/04/2018 1246   NITRITE NEGATIVE 08/04/2018 1246   LEUKOCYTESUR TRACE (A) 08/04/2018 1246    Recent Results (from the past 240 hour(s))  Surgical pcr screen     Status: None   Collection Time: 08/01/18  2:55 AM  Result Value Ref Range Status   MRSA, PCR NEGATIVE NEGATIVE Final   Staphylococcus aureus NEGATIVE NEGATIVE Final    Comment: (NOTE) The Xpert SA Assay (FDA approved for NASAL specimens in patients 49 years of age and older), is one component of a comprehensive surveillance program. It is not intended to diagnose infection nor to guide or monitor treatment. Performed at [facility], [place] [address], [place], [place] [address]   Culture, blood (routine x 2)     Status: None (Preliminary result)   Collection Time: 08/04/18 12:02 PM  Result Value Ref Range Status   Specimen Description BLOOD SITE NOT SPECIFIED  Final   Special Requests   Final    BOTTLES DRAWN AEROBIC AND ANAEROBIC Blood Culture adequate volume   Culture   Final    NO GROWTH 2 DAYS Performed at [facility], [place] [address], [place], [place] [address]    Report Status PENDING  Incomplete  Culture, blood (routine x 2)     Status: None (Preliminary result)   Collection Time: 08/04/18 12:02 PM  Result Value Ref Range Status   Specimen Description BLOOD RIGHT HAND  Final   Special Requests   Final    BOTTLES DRAWN AEROBIC AND ANAEROBIC Blood Culture adequate volume   Culture   Final    NO GROWTH 2 DAYS Performed at [facility], [place] [address] [place], [place] [address]    Report Status PENDING  Incomplete  Culture, Urine     Status: Abnormal   Collection Time: 08/04/18 12:38 PM  Result Value Ref Range Status   Specimen Description URINE, CATHETERIZED  Final   Special Requests   Final    NONE Performed at  [facility], [place] [address], [place],  [address]    Culture >=100,000 COLONIES/mL PSEUDOMONAS AERUGINOSA (A)  Final   Report Status 08/06/2018 FINAL  Final   Organism ID, Bacteria PSEUDOMONAS AERUGINOSA (A)  Final      Susceptibility   Pseudomonas aeruginosa - MIC*    CEFTAZIDIME 4 SENSITIVE Sensitive     CIPROFLOXACIN <=0.25 SENSITIVE Sensitive     GENTAMICIN 2 SENSITIVE Sensitive  IMIPENEM 2 SENSITIVE Sensitive     PIP/TAZO 32 SENSITIVE Sensitive     CEFEPIME 2 SENSITIVE Sensitive     * >=100,000 COLONIES/mL PSEUDOMONAS AERUGINOSA  Expectorated sputum assessment w rflx to resp cult     Status: None   Collection Time: 08/05/18 12:46 PM  Result Value Ref Range Status   Specimen Description SPUTUM  Final   Special Requests NONE  Final   Sputum evaluation   Final    THIS SPECIMEN IS ACCEPTABLE FOR SPUTUM CULTURE Performed at [facility], [place] [address], [place], [place] [address]    Report Status 08/05/2018 FINAL  Final  Culture, respiratory     Status: None (Preliminary result)   Collection Time: 08/05/18 12:46 PM  Result Value Ref Range Status   Specimen Description SPUTUM  Final   Special Requests NONE Reflexed from [phone]  Final   Gram Stain   Final    ABUNDANT WBC PRESENT, PREDOMINANTLY PMN RARE GRAM POSITIVE COCCI IN PAIRS RARE BUDDING YEAST SEEN RARE GRAM POSITIVE RODS    Culture   Final    CULTURE REINCUBATED FOR BETTER GROWTH Performed at [facility], [place] [address] [place], [place] [address]    Report Status PENDING  Incomplete         Radiology Studies: Ct Chest Wo Contrast  Result Date: 08/04/2018 CLINICAL DATA:  Follow-up pneumonia EXAM: CT CHEST WITHOUT CONTRAST TECHNIQUE: Multidetector CT imaging of the chest was performed following the standard protocol without IV contrast. COMPARISON:  08/04/2018, 07/31/2018 CT chest 06/08/2008 FINDINGS: Cardiovascular: Limited evaluation without intravenous contrast. Nonaneurysmal aorta.  Moderate aortic atherosclerosis. Post CABG changes. Coronary vascular calcification. Mild cardiomegaly. No significant pericardial effusion. Mediastinum/Nodes: Midline trachea. Esophagus within normal limits. Prominent mediastinal lymph nodes. Right paratracheal lymph node measures 13 mm. Multiple small subcentimeter prevascular and AP window lymph nodes, though increased as compared with 2009 chest CT. Distal esophageal lymph node measuring 11 mm. Lungs/Pleura: Volume loss with shift of mediastinal contents to the left. No significant pleural effusion. Peripheral pleural thickening at the left lung base, some of which was present on prior. Increased peripheral consolidation in the left upper lobe with pleural thickening present. Multifocal within the left lower lobe and left upper lobe. Abduct termination of left mainstem bronchus. There is expansion and filling of lingular and left lower lobe bronchi. Upper Abdomen: Surgical clips at the gallbladder fossa. No acute abnormality. Fat density in the region of right adrenal gland suggesting adrenal myelolipoma, this measures about 3.4 cm. Probable partially visualized cyst upper pole left kidney. Musculoskeletal: No acute or suspicious abnormality. Degenerative changes. Post sternotomy changes. IMPRESSION: 1. Mild mediastinal shift to the left consistent with volume loss. Multifocal consolidations within the left upper and lower lobes, suspect for pneumonia. Underlying or associated mass lesion is not excluded given the extensive consolidations present. There is abrupt occlusion of the left lower lobe and lingular bronchi by complex secretions, debris, or tissue. Additional finding of diffuse left pleural thickening, which could be secondary to infection or possible pleural mass. 2. Mediastinal adenopathy which may be reactive or potentially due to metastatic disease. 3. Cardiomegaly 4. Suspected 3.4 cm right adrenal myelolipoma Aortic Atherosclerosis (ICD10-I70.0).  Electronically Signed   By: Donavan Foil M.D.   On: 08/04/2018 22:16   Dg Chest Port 1 View  Result Date: 08/06/2018 CLINICAL DATA:  Respiratory failure, CHF, history of previous MI former smoker. EXAM: PORTABLE CHEST 1 VIEW COMPARISON:  Chest x-ray of August 04, 2018 FINDINGS:  There has been further opacification of the left hemithorax such that there is minimal if any aerated lung remaining. There is no mediastinal shift. The right lung is adequately inflated and clear. The pulmonary vascularity is not engorged. The ICD is in stable position. There are post CABG changes. There is dense calcification in the wall of the thoracic aorta. IMPRESSION: Opacification of the left hemithorax consistent with progressive atelectasis of the left lung. Possible pneumonia. Electronically Signed   By: David  Martinique M.D.   On: 08/06/2018 10:17   Ir US Chest  Result Date: 08/04/2018 CLINICAL DATA:  Extensive left hemithorax opacification suggesting large effusion. Thoracentesis requested. EXAM: CHEST ULTRASOUND COMPARISON:  Radiography from earlier the same day FINDINGS: There is only a trace left pleural effusion. No safe pocket for thoracentesis. IMPRESSION: Trace left pleural effusion; thoracentesis deferred. Consolidation/atelectasis presumably accounts for much of the opacification of left hemithorax on radiography. CT may be useful for further characterization Electronically Signed   By: Lucrezia Europe M.D.   On: 08/04/2018 16:48        Scheduled Meds: . amiodarone  100 mg Oral q morning - 10a  . atorvastatin  20 mg Oral Daily  . calcium carbonate  1 tablet Oral Daily  . dorzolamide-timolol  1 drop Left Eye Daily  . enoxaparin (LOVENOX) injection  40 mg Subcutaneous Q24H  . feeding supplement (ENSURE ENLIVE)  237 mL Oral BID BM  . ferrous sulfate  325 mg Oral Q breakfast  . gabapentin  300 mg Oral TID  . guaiFENesin  1,200 mg Oral BID  . latanoprost  1 drop Left Eye QHS  . levothyroxine  137 mcg Oral  Daily  . multivitamin with minerals  1 tablet Oral Daily  . tamsulosin  0.4 mg Oral BID   Continuous Infusions: . piperacillin-tazobactam (ZOSYN)  IV 3.375 g (08/06/18 0510)     LOS: 5 days     [name], MD, FACP, [employer]. [facility] Pager [phone]  If 7PM-7AM, please contact night-coverage [email or web address] Password [email or web address] 08/06/2018, 2:06 PM

## 2018-08-07 ENCOUNTER — Inpatient Hospital Stay (HOSPITAL_COMMUNITY): Payer: PPO

## 2018-08-07 ENCOUNTER — Encounter (HOSPITAL_COMMUNITY): Payer: Self-pay | Admitting: Student

## 2018-08-07 LAB — CULTURE, RESPIRATORY: CULTURE: NORMAL

## 2018-08-07 LAB — CBC
HCT: 25.5 % — ABNORMAL LOW (ref 39.0–52.0)
HEMOGLOBIN: 8 g/dL — AB (ref 13.0–17.0)
MCH: 30.9 pg (ref 26.0–34.0)
MCHC: 31.4 g/dL (ref 30.0–36.0)
MCV: 98.5 fL (ref 80.0–100.0)
NRBC: 0 % (ref 0.0–0.2)
Platelets: 191 10*3/uL (ref 150–400)
RBC: 2.59 MIL/uL — AB (ref 4.22–5.81)
RDW: 14.2 % (ref 11.5–15.5)
WBC: 10.6 10*3/uL — ABNORMAL HIGH (ref 4.0–10.5)

## 2018-08-07 LAB — BASIC METABOLIC PANEL
ANION GAP: 8 (ref 5–15)
BUN: 42 mg/dL — ABNORMAL HIGH (ref 8–23)
CO2: 27 mmol/L (ref 22–32)
Calcium: 8 mg/dL — ABNORMAL LOW (ref 8.9–10.3)
Chloride: 95 mmol/L — ABNORMAL LOW (ref 98–111)
Creatinine, Ser: 1.55 mg/dL — ABNORMAL HIGH (ref 0.61–1.24)
GFR calc Af Amer: 46 mL/min — ABNORMAL LOW (ref >60)
GFR, EST NON AFRICAN AMERICAN: 40 mL/min — AB (ref >60)
GLUCOSE: 167 mg/dL — AB (ref 70–99)
POTASSIUM: 4 mmol/L (ref 3.5–5.1)
SODIUM: 130 mmol/L — AB (ref 135–145)

## 2018-08-07 LAB — PROCALCITONIN: PROCALCITONIN: 0.28 ng/mL

## 2018-08-07 LAB — CULTURE, RESPIRATORY W GRAM STAIN

## 2018-08-07 MED ORDER — ACETYLCYSTEINE 20 % IN SOLN
2.0000 mL | Freq: Three times a day (TID) | RESPIRATORY_TRACT | Status: DC
  Administered 2018-08-07 – 2018-08-09 (×5): 2 mL via RESPIRATORY_TRACT
  Filled 2018-08-07 (×9): qty 4

## 2018-08-07 MED ORDER — ALBUTEROL SULFATE (2.5 MG/3ML) 0.083% IN NEBU
2.5000 mg | INHALATION_SOLUTION | RESPIRATORY_TRACT | Status: DC | PRN
  Administered 2018-08-07 – 2018-08-09 (×5): 2.5 mg via RESPIRATORY_TRACT
  Filled 2018-08-07 (×7): qty 3

## 2018-08-07 MED ORDER — SODIUM CHLORIDE 3 % IN NEBU
4.0000 mL | INHALATION_SOLUTION | Freq: Every day | RESPIRATORY_TRACT | Status: AC
  Administered 2018-08-08 – 2018-08-09 (×2): 4 mL via RESPIRATORY_TRACT
  Filled 2018-08-07 (×4): qty 4

## 2018-08-07 NOTE — Progress Notes (Signed)
Physical Therapy Treatment Patient Details Name: [name] MRN: [id number] DOB: Mar 03, 1933 Today's Date: 08/07/2018    History of Present Illness  ROMULO [name] is a 84 y.o. male with history of ischemic cardiomyopathy status post ICD placement and on amiodarone, diabetes mellitus type 2, hypertension, hypothyroidism and hyperlipidemia was brought from the home after patient had a fall.  pt with right IM  nail.  Pt WBAT    PT Comments    Patient with limited progress and not successful making to chair today.  Attempted with walker instead of Stedy, but pt too fearful, painful and unwilling to try again with different equipment.  Reports just had bath and moved for cleaning up bowel movement in bed and pain is worse right now.  Feel he continues to need OOB frequently due to lung function.  Sat up and demonstrated audible gurggling, but could not expectorate.  Left in bed with HOB 30 degrees and encouragement for IS and flutter use.  PT to follow acutely.  Continue to recommend SNF rehab upon d/c.    Follow Up Recommendations  SNF;Supervision/Assistance - 24 hour     Equipment Recommendations  None recommended by PT    Recommendations for Other Services       Precautions / Restrictions Precautions Precautions: Fall Restrictions RLE Weight Bearing: Weight bearing as tolerated    Mobility  Bed Mobility Overal bed mobility: Needs Assistance Bed Mobility: Supine to Sit;Sit to Supine     Supine to sit: HOB elevated;Mod assist;+2 for physical assistance Sit to supine: Max assist;+2 for physical assistance   General bed mobility comments: increased time, max cues for pt to lift hips with L foot on bed and assist with bed pad to scoot hips to EOB and to lift trunk; to supine assist for trunk and legs  Transfers Overall transfer level: Needs assistance Equipment used: Rolling walker (2 wheeled) Transfers: Sit to/from Stand Sit to Stand: Max assist;+2 physical assistance          General transfer comment: heavy lifting assist from EOB with pt complaining too much pain, lurching forward with yelling we are letting him fall; so returned to sitting then to supine as he refused to stand again with stedy  Ambulation/Gait                 Stairs             Wheelchair Mobility    Modified Rankin (Stroke Patients Only)       Balance Overall balance assessment: Needs assistance Sitting-balance support: Feet supported Sitting balance-Leahy Scale: Poor Sitting balance - Comments: initially leaning L, assist to place feet to get his balance   Standing balance support: Bilateral upper extremity supported Standing balance-Leahy Scale: Zero Standing balance comment: UE support on walker, +2 A for standing and pt still lurching forward stating he is going to fall; assist to keep safe and allowed to return to sitting on EOB                            Cognition Arousal/Alertness: Awake/alert Behavior During Therapy: Agitated Overall Cognitive Status: Impaired/Different from baseline                       Memory: Decreased short-term memory Following Commands: Follows one step commands inconsistently     Problem Solving: Requires verbal cues;Slow processing        Exercises Total Joint Exercises  Ankle Circles/Pumps: AROM;10 reps;Both;Supine Quad Sets: 5 reps;Right;Supine;AROM Heel Slides: AAROM;Right;5 reps;Supine    General Comments General comments (skin integrity, edema, etc.): reports leg very sore today after cleaning up in bed after BM and just got Tylenol recently      Pertinent Vitals/Pain Pain Assessment: Faces Faces Pain Scale: Hurts worst Pain Location: R hip with movement Pain Descriptors / Indicators: Aching;Grimacing;Guarding;Moaning Pain Intervention(s): Monitored during session;Repositioned;Ice applied;Limited activity within patient's tolerance    Home Living                       Prior Function            PT Goals (current goals can now be found in the care plan section) Progress towards PT goals: Not progressing toward goals - comment(due to more pain tis session)    Frequency    Min 3X/week      PT Plan Current plan remains appropriate    Co-evaluation              AM-PAC PT "6 Clicks" Daily Activity  Outcome Measure  Difficulty turning over in bed (including adjusting bedclothes, sheets and blankets)?: Unable Difficulty moving from lying on back to sitting on the side of the bed? : Unable Difficulty sitting down on and standing up from a chair with arms (e.g., wheelchair, bedside commode, etc,.)?: Unable Help needed moving to and from a bed to chair (including a wheelchair)?: A Lot Help needed walking in hospital room?: Total Help needed climbing 3-5 steps with a railing? : Total 6 Click Score: 7    End of Session Equipment Utilized During Treatment: Gait belt Activity Tolerance: Patient limited by pain Patient left: in bed;with call bell/phone within reach;with bed alarm set Nurse Communication: Mobility status PT Visit Diagnosis: Other abnormalities of gait and mobility (R26.89);History of falling (Z91.81);Difficulty in walking, not elsewhere classified (R26.2);Pain Pain - Right/Left: Right Pain - part of body: Hip     Time: 4801-6553 PT Time Calculation (min) (ACUTE ONLY): 29 min  Charges:  $Therapeutic Exercise: 8-22 mins $Therapeutic Activity: 8-22 mins                     Magda Kiel, [place] [facility] [phone] 08/07/2018    Reginia Naas 08/07/2018, 1:55 PM

## 2018-08-07 NOTE — Progress Notes (Signed)
PROGRESS NOTE    Gregory [name]  YSA:[id number] DOB: 04-Dec-1932 DOA: 07/31/2018 PCP: Christain Sacramento, MD      Brief Narrative:  Gregory [name] is a 84 y.o. M with chronic systolic CHF, EF 32%, ICD in place, Hypothyroidism, CKD III baseline Cr 1.5-2.0,  BPH, HTN, DM, and macrocytic anemia who presented with mechanical fall, right hip pain.  Found in the ER to have nondisplaced RIGHT intertrochanteric fracture.  Status post IM nail 10/10. Hospitalization now complicated by post-operative pneumonia versus atelectasis due to mucous plugging and hypoxia.  Pulmonology consulted.  Improving.  Possible DC to SNF 10/17.   Assessment & Plan:  Closed right hip fracture, S/p IM nail on 10/10 Patient underwent procedure after cardiology preop clearance.  Orthopedics assisting with care.  They recommend WBAT RLE, Lovenox for DVT prophylaxis.  Outpatient follow-up with orthopedics.  Atelectasis versus HCAP (felt less likely) complicated by acute hypoxic respiratory failure - Over the weekend, patient developed progressive sluggishness, new leukocytosis, hypoxia.  Chest x-ray yesterday showed airspace disease in the left, initially thought to be effusion. - thoracentesis attempted but no significant tappable effusion was seen on ultrasound.   - Follow-up CT scan showed possible left mainstem bronchus occlusion with debris, multifocal pneumonia. - SLP note no overt aspiration, cannot rule out silent aspiration or previous one time event, although no event noted over weekend. -Treating empirically with Zosyn and azithromycin - He had been weaned to room air yesterday but back on oxygen despite saturations in the 90s.  Monitor off of oxygen and aim for oxygen saturation 88-95%. - Aspiration precautions - Pulmonary consultation and follow-up appreciated.  They suspect atelectasis and mucous plugging given body habitus, position and inability to clear secretions rather than pneumonia. - Continue aggressive  pulmonary toilet, chest PT, mobilize, incentive spirometer, flutter valve and follow-up serial chest x-rays.  Continuing to hold off bronchoscopy for now due to concern that he may need intubation if it is done. - As discussed with pulmonology, discontinue antibiotics for this indication.  I personally reviewed chest x-ray from today which shows no change and persistent white out of left lung fields.  Management per pulmonology. -Sputum culture shows normal respiratory flora.  Pseudomonas CAUTI Pain with foley catheter on 10/14.  Foley replaced, new urine culture growing Pseudomonas.   -Day 3 IV Zosyn.  Consider stopping antibiotics after today's dose.  Acute on chronic macrocytic anemia Baseline macrocytosis, unclear cause of baseline anemia. Now with superimposed acute blood loss anemia.   B12 normal.  No clinical bleeding.  Transfused 1 unit on 10/13, appropriate post-transfusion increase in Hgb.  Hemoglobin has gradually drifted down to 8.?  Critical illness. -Transfusion threshold 8 g/dL -Continue iron  Delirium Intermittent.  Currently resolved.  May also be due to hard of hearing.  Chronic systolic CHF Hypertenion EF 25% in 2015.  No signs of hypervolemia. -Strict I/Os -Continue amiodarone, statin -Holding carvedilol, ramipril and furosemide 40. -Consider resuming these at discharge BP permitting.  Blood pressure controlled.  BPH Urinary retention Failed voiding trial over weekend, Foley placed 10/12, cultured on 10/14 with Pseudomonas and now replaced.   -Continue BID Flomax -As per RN, failed voiding trial again on 10/15.  Will need to discharge to SNF with Foley catheter and outpatient follow-up with urology in 2 weeks.  Diabetes type II No hyperglycemia -Continue SSI.  Mildly uncontrolled and fluctuating. -Continue gabapentin  Other medications -Continue eye drops  Hypothyroidism -Continue levothyroxine  CKD IV  Cr no change.  Creatinine  down to 1.5.  Hard of  hearing States that his hearing aid has run out of battery and needs replacement.  Right eye blindness/enucleated.   DVT prophylaxis: Lovenox Code Status: FULL Family Communication: None at bedside MDM and disposition Plan: DC to SNF pending clinical improvement.  Given persistent white out of left lung fields on chest x-ray/consistent with collapse, ongoing risk of decline/respiratory failure requiring intubation, reassess chest x-ray in a.m. to determine further course of action.     Consultants:   Orthopedics  Pulmonology  Procedures:   IM Nail 10/10  Antimicrobials:  Ceftriaxone 10/14  Azithromycin 10/14  Zosyn 10/15 >>  Culture data:  10/14 blood culture x2: NGTD  10/14 Urine culture: Pseudomonas  10/15 Sputum culture: pending      Subjective: Reports dyspnea is better but still not at baseline.  Having more cough and sputum-"different colors".  No chest pain.  Objective: Vitals:   08/07/18 0532 08/07/18 0830 08/07/18 1159 08/07/18 1350  BP: (!) 119/58 (!) 120/54  (!) 121/55  Pulse: 67 67  68  Resp:      Temp: 98.7 F (37.1 C) 97.9 F (36.6 C)  98.6 F (37 C)  TempSrc: Oral Oral  Oral  SpO2: 97% 100% 94% 95%  Weight:      Height:        Intake/Output Summary (Last 24 hours) at 08/07/2018 1508 Last data filed at 08/07/2018 1200 Gross per 24 hour  Intake 50 ml  Output 1070 ml  Net -1020 ml   Filed Weights   07/31/18 2201  Weight: 70.8 kg    Examination: General appearance: Pleasant elderly male, moderately built and nourished, lying comfortably propped up in bed without distress.  Has right hearing aid. Eyes: The right eye is atrophied, this is congenital.  Has a patch over right eye. The left eye appears normal.   Cardiac: Heart rate regular, soft systolic murmur, JVP normal, no lower extremity edema.  Stable. Respiratory: Reduced breath sounds left lung fields and in right base.  No wheezing, rhonchi or crackles appreciated.  No  increased work of breathing today.  No significant change in auscultation. Abdomen: Abdomen soft without tenderness to palpation, ascites, distention, hepatospleno megaly.  Normal bowel sounds heard.  Stable. Neuro: Alert and oriented.  No focal neurological deficits.  Stable. Psych: Judgment and insight appear impaired.  Mood and affect appear appropriate. Extremities: Symmetric 5 x 5 power.  Right hip surgical site without acute findings. GU: Foley catheter +.    Data Reviewed: I have personally reviewed following labs and imaging studies:  CBC: Recent Labs  Lab 07/31/18 2318  08/03/18 2009 08/04/18 0352 08/05/18 0206 08/06/18 0136 08/07/18 0159  WBC 10.7*   < > 11.8* 13.4* 11.5* 12.1* 10.6*  NEUTROABS 6.8  --   --   --   --   --   --   HGB 10.8*   < > 10.2* 9.6* 9.4* 8.5* 8.0*  HCT 33.6*   < > 30.7* 30.2* 29.2* 26.3* 25.5*  MCV 101.8*   < > 97.2 98.4 99.0 98.9 98.5  PLT 175   < > 155 152 164 174 191   < > = values in this interval not displayed.   Basic Metabolic Panel: Recent Labs  Lab 08/03/18 0216 08/04/18 0352 08/05/18 0206 08/06/18 0136 08/07/18 0159  NA 136 135 137 132* 130*  K 4.7 4.9 5.3* 4.1 4.0  CL 99 99 98 94* 95*  CO2 29 29 30 28  27  GLUCOSE 123* 174* 132* 134* 167*  BUN 25* 35* 37* 41* 42*  CREATININE 1.62* 1.73* 1.70* 1.69* 1.55*  CALCIUM 8.6* 8.5* 8.9 8.5* 8.0*   GFR: Estimated Creatinine Clearance: 33.2 mL/min (A) (by C-G formula based on SCr of 1.55 mg/dL (H)). Liver Function Tests: Recent Labs  Lab 08/05/18 0206  AST 28  ALT 9  ALKPHOS 83  BILITOT 0.8  PROT 6.3*  ALBUMIN 2.4*   Coagulation Profile: Recent Labs  Lab 07/31/18 2325  INR 1.17   CBG: Recent Labs  Lab 08/02/18 2034 08/03/18 0014 08/03/18 0440 08/03/18 1148 08/03/18 1646  GLUCAP 112* 113* 109* 158* 175*   Urine analysis:    Component Value Date/Time   COLORURINE YELLOW 08/04/2018 [place] 08/04/2018 1246   LABSPEC 1.016 08/04/2018 1246    PHURINE 5.0 08/04/2018 1246   GLUCOSEU NEGATIVE 08/04/2018 1246   HGBUR MODERATE (A) 08/04/2018 1246   BILIRUBINUR NEGATIVE 08/04/2018 1246   [place] 08/04/2018 1246   PROTEINUR NEGATIVE 08/04/2018 1246   NITRITE NEGATIVE 08/04/2018 1246   LEUKOCYTESUR TRACE (A) 08/04/2018 1246    Recent Results (from the past 240 hour(s))  Surgical pcr screen     Status: None   Collection Time: 08/01/18  2:55 AM  Result Value Ref Range Status   MRSA, PCR NEGATIVE NEGATIVE Final   Staphylococcus aureus NEGATIVE NEGATIVE Final    Comment: (NOTE) The Xpert SA Assay (FDA approved for NASAL specimens in patients 31 years of age and older), is one component of a comprehensive surveillance program. It is not intended to diagnose infection nor to guide or monitor treatment. Performed at [facility], [place] [address], [place], [place] [address]   Culture, blood (routine x 2)     Status: None (Preliminary result)   Collection Time: 08/04/18 12:02 PM  Result Value Ref Range Status   Specimen Description BLOOD SITE NOT SPECIFIED  Final   Special Requests   Final    BOTTLES DRAWN AEROBIC AND ANAEROBIC Blood Culture adequate volume   Culture   Final    NO GROWTH 3 DAYS Performed at [facility], [address] [place], [place] [address]    Report Status PENDING  Incomplete  Culture, blood (routine x 2)     Status: None (Preliminary result)   Collection Time: 08/04/18 12:02 PM  Result Value Ref Range Status   Specimen Description BLOOD RIGHT HAND  Final   Special Requests   Final    BOTTLES DRAWN AEROBIC AND ANAEROBIC Blood Culture adequate volume   Culture   Final    NO GROWTH 3 DAYS Performed at [facility], [place] [address] [place], [place] [address]    Report Status PENDING  Incomplete  Culture, Urine     Status: Abnormal   Collection Time: 08/04/18 12:38 PM  Result Value Ref Range Status   Specimen Description URINE, CATHETERIZED  Final   Special Requests   Final      NONE Performed at [facility], [place] [address], [place], [place] [address]    Culture >=100,000 COLONIES/mL PSEUDOMONAS AERUGINOSA (A)  Final   Report Status 08/06/2018 FINAL  Final   Organism ID, Bacteria PSEUDOMONAS AERUGINOSA (A)  Final      Susceptibility   Pseudomonas aeruginosa - MIC*    CEFTAZIDIME 4 SENSITIVE Sensitive     CIPROFLOXACIN <=0.25 SENSITIVE Sensitive     GENTAMICIN 2 SENSITIVE Sensitive     IMIPENEM 2 SENSITIVE  Sensitive     PIP/TAZO 32 SENSITIVE Sensitive     CEFEPIME 2 SENSITIVE Sensitive     * >=100,000 COLONIES/mL PSEUDOMONAS AERUGINOSA  Expectorated sputum assessment w rflx to resp cult     Status: None   Collection Time: 08/05/18 12:46 PM  Result Value Ref Range Status   Specimen Description SPUTUM  Final   Special Requests NONE  Final   Sputum evaluation   Final    THIS SPECIMEN IS ACCEPTABLE FOR SPUTUM CULTURE Performed at [facility], [place] [address] [place], [place] [address]    Report Status 08/05/2018 FINAL  Final  Culture, respiratory     Status: None   Collection Time: 08/05/18 12:46 PM  Result Value Ref Range Status   Specimen Description SPUTUM  Final   Special Requests NONE Reflexed from [id number]  Final   Gram Stain   Final    ABUNDANT WBC PRESENT, PREDOMINANTLY PMN RARE GRAM POSITIVE COCCI IN PAIRS RARE BUDDING YEAST SEEN RARE GRAM POSITIVE RODS    Culture   Final    MODERATE Consistent with normal respiratory flora. Performed at [facility], [place] [address] [place], [place] [address]    Report Status 08/07/2018 FINAL  Final         Radiology Studies: Dg Chest Portable 1 View  Result Date: 08/07/2018 CLINICAL DATA:  Atelectasis EXAM: PORTABLE CHEST 1 VIEW COMPARISON:  08/06/2018 FINDINGS: Near complete opacification in the left hemithorax is stable. Left subclavian AICD device and leads are stable and intact. Right lung is clear. No pneumothorax. IMPRESSION: Stable near complete opacification in the left  hemithorax which is likely due to a combination of consolidation and volume loss. Electronically Signed   By: Marybelle Killings M.D.   On: 08/07/2018 09:20   Dg Chest Port 1 View  Result Date: 08/06/2018 CLINICAL DATA:  Respiratory failure, CHF, history of previous MI former smoker. EXAM: PORTABLE CHEST 1 VIEW COMPARISON:  Chest x-ray of August 04, 2018 FINDINGS: There has been further opacification of the left hemithorax such that there is minimal if any aerated lung remaining. There is no mediastinal shift. The right lung is adequately inflated and clear. The pulmonary vascularity is not engorged. The ICD is in stable position. There are post CABG changes. There is dense calcification in the wall of the thoracic aorta. IMPRESSION: Opacification of the left hemithorax consistent with progressive atelectasis of the left lung. Possible pneumonia. Electronically Signed   By: David  Martinique M.D.   On: 08/06/2018 10:17        Scheduled Meds: . amiodarone  100 mg Oral q morning - 10a  . atorvastatin  20 mg Oral Daily  . calcium carbonate  1 tablet Oral Daily  . dorzolamide-timolol  1 drop Left Eye Daily  . enoxaparin (LOVENOX) injection  40 mg Subcutaneous Q24H  . feeding supplement (ENSURE ENLIVE)  237 mL Oral BID BM  . ferrous sulfate  325 mg Oral Q breakfast  . gabapentin  300 mg Oral TID  . guaiFENesin  1,200 mg Oral BID  . latanoprost  1 drop Left Eye QHS  . levothyroxine  137 mcg Oral Daily  . multivitamin with minerals  1 tablet Oral Daily  . tamsulosin  0.4 mg Oral BID   Continuous Infusions: . piperacillin-tazobactam (ZOSYN)  IV 3.375 g (08/07/18 0701)     LOS: 6 days     [name], MD, FACP, [facility]. [facility] Pager [phone]  If 7PM-7AM, please contact night-coverage  [email or web address] Password [email or web address] 08/07/2018, 3:08 PM

## 2018-08-07 NOTE — Progress Notes (Signed)
NAME:  Gregory [name], MRN:  [id number], DOB:  05-05-1933, LOS: 6 ADMISSION DATE:  07/31/2018, CONSULTATION DATE:  08/05/2018 REFERRING MD:  Loleta Books, CHIEF COMPLAINT:  Interval development of  Multifocal consolidations within the left upper and lower lobes,and abrupt occlusion of LLL, post ORIF right hip on 07/31/2018.  Brief History   Mr. Schiff is a 84 y.o. M former smoker of pipe and cigars ( Quit 2006) with sCHF, EF 25%, ICD in place, Hypothyroidism, CKD III baseline Cr 1.5-2.0,  BPH, HTN, DM, and macrocytic anemia who presented with mechanical fall, right hip pain.  Found in the ER to have nondisplaced RIGHT intertrochanteric fracture.Pt had Closed right hip fracture s/p IM nail on 10/10. Chest X ray 10/14 showed Interval development of a large left pleural effusion with associated atelectasis or possible pneumonia, clear right lung. CT Chest done 10/14 showed mild mediastinal shift to the left consistent with volume loss. Multifocal consolidations within the left upper and lower lobes, suspect for pneumonia, with concern for underlying mass lesions due to extensive consolidations and  abrupt occlusion of the left lower lobe and lingular bronchi by complex secretions, debris, or tissue.  PCCM have been consulted to assist with management of this new pulmonary finding.  Past Medical History  Former smoker (Quit 2006) with sCHF, EF 25%, ICD in place, Hypothyroidism, CKD III baseline Cr 1.5-2.0,  BPH, HTN, DM,  macrocytic anemia , and recent hip fracture post fall with surgical repair. Significant Hospital Events   07/31/2018>> Admission with Fx hip 10/15 > L Sided collapse on CXR  Consults: date of consult/date signed off & final recs:  10/15>> Pulmonary  Procedures (surgical and bedside):    Significant Diagnostic Tests:  08/04/2018>>CT Chest w/o contrast Mild mediastinal shift to the left consistent with volume loss. Multifocal consolidations within the left upper and lower lobes,  suspect for pneumonia. Underlying or associated mass lesion is not excluded given the extensive consolidations present. There is abrupt occlusion of the left lower lobe and lingular bronchi by complex secretions, debris, or tissue. Additional finding of diffuse left pleural thickening, which could be secondary to infection or possible pleural mass. Mediastinal adenopathy which may be reactive or potentially due to metastatic disease. Cardiomegaly Suspected 3.4 cm right adrenal myelolipoma  CXR 10/17 reviewed by me: stable L sided collapse.   Micro Data:  08/04/2018>> Blood 08/04/2018>> Urine:  Pseudomonas 08/05/2018>> Sputum: normal flora Antimicrobials:  Zosyn 08/05/2018>> Azithromycin 10/14>> 10/15 Rocephin 10/14-10/15  Subjective:  Feeling better today. Off supplemental O2 with no complaints.   Objective   Blood pressure (!) 120/54, pulse 67, temperature 97.9 F (36.6 C), temperature source Oral, resp. rate 19, height 5\' 6"  (1.676 m), weight 70.8 kg, SpO2 94 %.        Intake/Output Summary (Last 24 hours) at 08/07/2018 1329 Last data filed at 08/07/2018 1200 Gross per 24 hour  Intake 50 ml  Output 1070 ml  Net -1020 ml   Filed Weights   07/31/18 2201  Weight: 70.8 kg    Examination: General: elderly male in NAD HENT: [place]/AT, PERRL, no JVD Lungs: Clear bilateral breath sounds Cardiovascular: RRR, no MRG Abdomen: Soft, NT, ND. BS positive Extremities: Dressing to Right hip, No other obvious deformities Neuro: Alert and oriented, interactive. Non-focal Skin: intact except for wound   Resolved Hospital Problem list     Assessment & Plan:  84 year old severely physically debilitated man presenting with atelectasis and infiltrate of the left lung, favored is atelectasis and mucous  plugging given body habitus, position and inability to clear secretions.  Seems to hve developed rather quickly to represent mass.   Atelectasis as above: - Aggressive pulmonary toilet -  Chest vest - OOB to chair - Mucinex - CXR in AM - IS every hour - Flutter valve - Symptomatically he is improving. Starting to have productive cough. Hold off on bronchoscopy for now.  - If develops distress position him with R lung down.   PNA: Unlikely without fever, WBC stable and PCT of 0.5 - ABX per primary  Hypoxemia: improved. Now on RA with sats 94% - Titrate O2 for sat of 88-95%  Pulmonary edema: - Diureses per primary team  Deconditioning: - Nutrition consult and PT as able  He has requested softer diet. Having trouble with burger and fries despite speech recommendations.  Will change him to soft diet.   Disposition / Summary of Today's Plan 08/07/18   Feeling good today. Continue aggressive pulm toilette  Consider bronch for sputum cx and possible biopsy if he does not continue to improve CXR in AM PCCM will continue to follow    Diet: Regular Pain/Anxiety/Delirium protocol (if indicated): Hydrocodone DVT prophylaxis: Lovenox GI prophylaxis:  Hyperglycemia protocol: SSI Mobility: OOB to chair/ PT/OT Code Status: Full Family Communication: Sisters updated at bedside 10/15  Labs   CBC: Recent Labs  Lab 07/31/18 2318  08/03/18 2009 08/04/18 0352 08/05/18 0206 08/06/18 0136 08/07/18 0159  WBC 10.7*   < > 11.8* 13.4* 11.5* 12.1* 10.6*  NEUTROABS 6.8  --   --   --   --   --   --   HGB 10.8*   < > 10.2* 9.6* 9.4* 8.5* 8.0*  HCT 33.6*   < > 30.7* 30.2* 29.2* 26.3* 25.5*  MCV 101.8*   < > 97.2 98.4 99.0 98.9 98.5  PLT 175   < > 155 152 164 174 191   < > = values in this interval not displayed.    Basic Metabolic Panel: Recent Labs  Lab 08/03/18 0216 08/04/18 0352 08/05/18 0206 08/06/18 0136 08/07/18 0159  NA 136 135 137 132* 130*  K 4.7 4.9 5.3* 4.1 4.0  CL 99 99 98 94* 95*  CO2 29 29 30 28 27   GLUCOSE 123* 174* 132* 134* 167*  BUN 25* 35* 37* 41* 42*  CREATININE 1.62* 1.73* 1.70* 1.69* 1.55*  CALCIUM 8.6* 8.5* 8.9 8.5* 8.0*    GFR: Estimated Creatinine Clearance: 33.2 mL/min (A) (by C-G formula based on SCr of 1.55 mg/dL (H)). Recent Labs  Lab 08/04/18 0352 08/04/18 1144 08/05/18 0206 08/06/18 0136 08/07/18 0159  PROCALCITON  --   --   --  0.25 0.28  WBC 13.4*  --  11.5* 12.1* 10.6*  LATICACIDVEN  --  1.0  --   --   --     Liver Function Tests: Recent Labs  Lab 08/05/18 0206  AST 28  ALT 9  ALKPHOS 83  BILITOT 0.8  PROT 6.3*  ALBUMIN 2.4*   No results for input(s): LIPASE, AMYLASE in the last 168 hours. No results for input(s): AMMONIA in the last 168 hours.  ABG    Component Value Date/Time   HCO3 26.5 (H) 11/25/2007 1222   TCO2 28 11/25/2007 1222     Coagulation Profile: Recent Labs  Lab 07/31/18 2325  INR 1.17    Cardiac Enzymes: No results for input(s): CKTOTAL, CKMB, CKMBINDEX, TROPONINI in the last 168 hours.  HbA1C: Hgb A1c MFr Bld  Date/Time  Value Ref Range Status  12/31/2011 12:20 PM 9.3 (H) 4.6 - 6.5 % Final    Comment:    Glycemic Control Guidelines for People with Diabetes:Non Diabetic:  <6%Goal of Therapy: <7%Additional Action Suggested:  >8%     CBG: Recent Labs  Lab 08/02/18 2034 08/03/18 0014 08/03/18 0440 08/03/18 1148 08/03/18 1646  GLUCAP 112* 113* 109* 158* 175*    Admitting History of Present Illness.   Mr. Halsted is a 84 y.o. M former smoker of pipe and cigars ( Quit 2006) with sCHF, EF 25%, ICD in place, Hypothyroidism, CKD III baseline Cr 1.5-2.0,  BPH, HTN, DM, and macrocytic anemia who presented with mechanical fall, right hip pain.  Found in the ER to have nondisplaced RIGHT intertrochanteric fracture.Pt had Closed right hip fracture s/p IM nail on 10/10. Chest X ray 10/14 showed Interval development of a large left pleural effusion with associated atelectasis or possible pneumonia, clear right lung. CT Chest done 10/14 showed mild mediastinal shift to the left consistent with volume loss. Multifocal consolidations within the left upper and  lower lobes, suspect for pneumonia, with concern for underlying mass lesions due to extensive consolidations and  abrupt occlusion of the left lower lobe and lingular bronchi by complex secretions, debris, or tissue.  PCCM have been consulted to assist with management of this new pulmonary finding.   Georgann Housekeeper, AGACNP-BC [place] Pager [phone] or [phone]  08/07/2018 2:31 PM

## 2018-08-08 ENCOUNTER — Inpatient Hospital Stay (HOSPITAL_COMMUNITY): Payer: PPO

## 2018-08-08 ENCOUNTER — Encounter (HOSPITAL_COMMUNITY): Payer: Self-pay | Admitting: Student

## 2018-08-08 LAB — BASIC METABOLIC PANEL
ANION GAP: 11 (ref 5–15)
BUN: 42 mg/dL — AB (ref 8–23)
CO2: 26 mmol/L (ref 22–32)
Calcium: 8.2 mg/dL — ABNORMAL LOW (ref 8.9–10.3)
Chloride: 97 mmol/L — ABNORMAL LOW (ref 98–111)
Creatinine, Ser: 1.51 mg/dL — ABNORMAL HIGH (ref 0.61–1.24)
GFR calc Af Amer: 48 mL/min — ABNORMAL LOW (ref >60)
GFR, EST NON AFRICAN AMERICAN: 41 mL/min — AB (ref >60)
GLUCOSE: 147 mg/dL — AB (ref 70–99)
POTASSIUM: 3.8 mmol/L (ref 3.5–5.1)
Sodium: 134 mmol/L — ABNORMAL LOW (ref 135–145)

## 2018-08-08 LAB — CBC
HEMATOCRIT: 26 % — AB (ref 39.0–52.0)
Hemoglobin: 8.3 g/dL — ABNORMAL LOW (ref 13.0–17.0)
MCH: 31.7 pg (ref 26.0–34.0)
MCHC: 31.9 g/dL (ref 30.0–36.0)
MCV: 99.2 fL (ref 80.0–100.0)
PLATELETS: 203 10*3/uL (ref 150–400)
RBC: 2.62 MIL/uL — AB (ref 4.22–5.81)
RDW: 14.3 % (ref 11.5–15.5)
WBC: 10.6 10*3/uL — ABNORMAL HIGH (ref 4.0–10.5)
nRBC: 0 % (ref 0.0–0.2)

## 2018-08-08 NOTE — Progress Notes (Signed)
PROGRESS NOTE    Gregory [name]  JEH:[id number] DOB: 1933-01-15 DOA: 07/31/2018 PCP: Christain Sacramento, MD      Brief Narrative:  Gregory [name] is a 84 y.o. M with chronic systolic CHF, EF 37%, ICD in place, Hypothyroidism, CKD III baseline Cr 1.5-2.0,  BPH, HTN, DM, and macrocytic anemia who presented with mechanical fall, right hip pain.  Found in the ER to have nondisplaced RIGHT intertrochanteric fracture.  Status post IM nail 10/10. Hospitalization now complicated by post-operative pneumonia versus atelectasis due to mucous plugging and hypoxia.  Pulmonology consulted.  Improving.  Possible DC to SNF 10/17.   Assessment & Plan:  Closed right hip fracture, S/p IM nail on 10/10 Patient underwent procedure after cardiology preop clearance.  Orthopedics assisting with care.  They recommend WBAT RLE, Lovenox for DVT prophylaxis.  Outpatient follow-up with orthopedics. Stable.  Atelectasis versus HCAP (felt less likely) complicated by acute hypoxic respiratory failure - Over the weekend, patient developed progressive sluggishness, new leukocytosis, hypoxia.  Chest x-ray yesterday showed airspace disease in the left, initially thought to be effusion. - thoracentesis attempted but no significant tappable effusion was seen on ultrasound.   - Follow-up CT scan showed possible left mainstem bronchus occlusion with debris, multifocal pneumonia. - SLP note no overt aspiration, cannot rule out silent aspiration or previous one time event, although no event noted over weekend. -Treating empirically with Zosyn and azithromycin - He had been weaned to room air yesterday but back on oxygen despite saturations in the 90s.  Monitor off of oxygen and aim for oxygen saturation 88-95%. - Aspiration precautions - Pulmonary consultation and follow-up appreciated.  They suspect atelectasis and mucous plugging given body habitus, position and inability to clear secretions rather than pneumonia. - Continue  aggressive pulmonary toilet, chest PT, mobilize, incentive spirometer, flutter valve and follow-up serial chest x-rays.  Continuing to hold off bronchoscopy for now due to concern that he may need intubation if it is done. - As discussed with pulmonology, discontinue antibiotics for this indication.  I personally reviewed chest x-ray from today which shows some improved aeration.  I discussed with Dr. Vaughan Browner who recommends continuing pulmonary toilet, chest vest, PT, mobilize, follow chest x-ray and hopefully can discharge in a day or 2 pending improvement in x-ray findings. -Sputum culture shows normal respiratory flora.  Pseudomonas CAUTI Pain with foley catheter on 10/14.  Foley replaced, new urine culture growing Pseudomonas.   -Completed 3 days of IV Zosyn.  Discontinue antibiotics.  Acute on chronic macrocytic anemia Baseline macrocytosis, unclear cause of baseline anemia. Now with superimposed acute blood loss anemia.   B12 normal.  No clinical bleeding.  Transfused 1 unit on 10/13, appropriate post-transfusion increase in Hgb.  Hemoglobin has gradually drifted down to 8.?  Critical illness. -Transfusion threshold 8 g/dL -Continue iron  Delirium Resolved.  Chronic systolic CHF Hypertenion EF 25% in 2015.  No signs of hypervolemia. -Strict I/Os -Continue amiodarone, statin -Holding carvedilol, ramipril and furosemide 40. -Consider resuming these at discharge BP permitting.  Blood pressure controlled off of medications.  Reassess need for above medications at discharge.  BPH Urinary retention Failed voiding trial over weekend, Foley placed 10/12, cultured on 10/14 with Pseudomonas and now replaced.   -Continue BID Flomax -As per RN, failed voiding trial again on 10/15.  Will need to discharge to SNF with Foley catheter and outpatient follow-up with urology in 2 weeks.  Diabetes type II No hyperglycemia -Continue SSI.  Mildly uncontrolled and fluctuating. -Continue  gabapentin  Other medications -Continue eye drops  Hypothyroidism -Continue levothyroxine  CKD IV  Cr no change.  Creatinine down to 1.5.  Hard of hearing States that his hearing aid has run out of battery and needs replacement.  Right eye blindness/enucleated.   DVT prophylaxis: Lovenox Code Status: FULL Family Communication: At patient's request, I discussed with patient's niece.  Updated care and answered questions. MDM and disposition Plan: DC to SNF pending clinical/radiological improvement.  Given persistent white out of left lung fields on chest x-ray/consistent with collapse, ongoing risk of decline/respiratory failure requiring intubation, reassess chest x-ray in a.m. to determine further course of action. I discussed with clinical social worker to explore if SNF will be able to accept him on the weekend.     Consultants:   Orthopedics  Pulmonology  Procedures:   IM Nail 10/10  Antimicrobials:  Ceftriaxone 10/14  Azithromycin 10/14  Zosyn 10/15 >>  Culture data:  10/14 blood culture x2: NGTD  10/14 Urine culture: Pseudomonas  10/15 Sputum culture: pending      Subjective: States that his breathing is back to baseline.  However coughing up more "gobs of different color".  No chest pain.  Objective: Vitals:   08/08/18 0529 08/08/18 0822 08/08/18 0832 08/08/18 1436  BP: (!) 117/43 110/61  (!) 131/53  Pulse: 66 67  70  Resp: 14     Temp: 98.7 F (37.1 C)   98.7 F (37.1 C)  TempSrc: Axillary   Oral  SpO2: 96% 94% 98% 95%  Weight:      Height:       No intake or output data in the 24 hours ending 08/08/18 1530 Filed Weights   07/31/18 2201  Weight: 70.8 kg    Examination: General appearance: Pleasant elderly male, moderately built and nourished, lying comfortably propped up in bed without distress.  Has right hearing aid. Eyes: The right eye is atrophied, this is congenital.  Has a patch over right eye. The left eye appears normal.    Cardiac: Heart rate regular, soft systolic murmur, JVP normal, no lower extremity edema.  Stable. Respiratory: Slightly improved breath sounds in left lung fields but still reduced in the base.  Right lung fields clear to auscultation.  No increased work of breathing. Abdomen: Abdomen soft without tenderness to palpation, ascites, distention, hepatospleno megaly.  Normal bowel sounds heard.  Stable. Neuro: Alert and oriented.  No focal neurological deficits.  Stable. Psych: Judgment and insight appear impaired.  Mood and affect appear appropriate. Extremities: Symmetric 5 x 5 power.  Right hip surgical site without acute findings. GU: Foley catheter +.    Data Reviewed: I have personally reviewed following labs and imaging studies:  CBC: Recent Labs  Lab 08/04/18 0352 08/05/18 0206 08/06/18 0136 08/07/18 0159 08/08/18 0231  WBC 13.4* 11.5* 12.1* 10.6* 10.6*  HGB 9.6* 9.4* 8.5* 8.0* 8.3*  HCT 30.2* 29.2* 26.3* 25.5* 26.0*  MCV 98.4 99.0 98.9 98.5 99.2  PLT 152 164 174 191 299   Basic Metabolic Panel: Recent Labs  Lab 08/04/18 0352 08/05/18 0206 08/06/18 0136 08/07/18 0159 08/08/18 0231  NA 135 137 132* 130* 134*  K 4.9 5.3* 4.1 4.0 3.8  CL 99 98 94* 95* 97*  CO2 29 30 28 27 26   GLUCOSE 174* 132* 134* 167* 147*  BUN 35* 37* 41* 42* 42*  CREATININE 1.73* 1.70* 1.69* 1.55* 1.51*  CALCIUM 8.5* 8.9 8.5* 8.0* 8.2*   GFR: Estimated Creatinine Clearance: 34 mL/min (A) (by C-G  formula based on SCr of 1.51 mg/dL (H)). Liver Function Tests: Recent Labs  Lab 08/05/18 0206  AST 28  ALT 9  ALKPHOS 83  BILITOT 0.8  PROT 6.3*  ALBUMIN 2.4*   Coagulation Profile: No results for input(s): INR, PROTIME in the last 168 hours. CBG: Recent Labs  Lab 08/02/18 2034 08/03/18 0014 08/03/18 0440 08/03/18 1148 08/03/18 1646  GLUCAP 112* 113* 109* 158* 175*   Urine analysis:    Component Value Date/Time   COLORURINE YELLOW 08/04/2018 [place] 08/04/2018  1246   LABSPEC 1.016 08/04/2018 1246   PHURINE 5.0 08/04/2018 1246   GLUCOSEU NEGATIVE 08/04/2018 1246   HGBUR MODERATE (A) 08/04/2018 1246   BILIRUBINUR NEGATIVE 08/04/2018 1246   [place] 08/04/2018 1246   PROTEINUR NEGATIVE 08/04/2018 1246   NITRITE NEGATIVE 08/04/2018 1246   LEUKOCYTESUR TRACE (A) 08/04/2018 1246    Recent Results (from the past 240 hour(s))  Surgical pcr screen     Status: None   Collection Time: 08/01/18  2:55 AM  Result Value Ref Range Status   MRSA, PCR NEGATIVE NEGATIVE Final   Staphylococcus aureus NEGATIVE NEGATIVE Final    Comment: (NOTE) The Xpert SA Assay (FDA approved for NASAL specimens in patients 34 years of age and older), is one component of a comprehensive surveillance program. It is not intended to diagnose infection nor to guide or monitor treatment. Performed at [facility], [place] [address] [place], [place] [address]   Culture, blood (routine x 2)     Status: None (Preliminary result)   Collection Time: 08/04/18 12:02 PM  Result Value Ref Range Status   Specimen Description BLOOD SITE NOT SPECIFIED  Final   Special Requests   Final    BOTTLES DRAWN AEROBIC AND ANAEROBIC Blood Culture adequate volume   Culture   Final    NO GROWTH 4 DAYS Performed at [facility], [place] [address], [place], [place] [address]    Report Status PENDING  Incomplete  Culture, blood (routine x 2)     Status: None (Preliminary result)   Collection Time: 08/04/18 12:02 PM  Result Value Ref Range Status   Specimen Description BLOOD RIGHT HAND  Final   Special Requests   Final    BOTTLES DRAWN AEROBIC AND ANAEROBIC Blood Culture adequate volume   Culture   Final    NO GROWTH 4 DAYS Performed at [facility], [place] [address] [place], [place] [address]    Report Status PENDING  Incomplete  Culture, Urine     Status: Abnormal   Collection Time: 08/04/18 12:38 PM  Result Value Ref Range Status   Specimen Description URINE,  CATHETERIZED  Final   Special Requests   Final    NONE Performed at [facility], [place] [address], [place], [place] [address]    Culture >=100,000 COLONIES/mL PSEUDOMONAS AERUGINOSA (A)  Final   Report Status 08/06/2018 FINAL  Final   Organism ID, Bacteria PSEUDOMONAS AERUGINOSA (A)  Final      Susceptibility   Pseudomonas aeruginosa - MIC*    CEFTAZIDIME 4 SENSITIVE Sensitive     CIPROFLOXACIN <=0.25 SENSITIVE Sensitive     GENTAMICIN 2 SENSITIVE Sensitive     IMIPENEM 2 SENSITIVE Sensitive     PIP/TAZO 32 SENSITIVE Sensitive     CEFEPIME 2 SENSITIVE Sensitive     * >=100,000 COLONIES/mL PSEUDOMONAS AERUGINOSA  Expectorated sputum assessment w rflx to resp cult  Status: None   Collection Time: 08/05/18 12:46 PM  Result Value Ref Range Status   Specimen Description SPUTUM  Final   Special Requests NONE  Final   Sputum evaluation   Final    THIS SPECIMEN IS ACCEPTABLE FOR SPUTUM CULTURE Performed at [facility], [address] [place], [place] [address]    Report Status 08/05/2018 FINAL  Final  Culture, respiratory     Status: None   Collection Time: 08/05/18 12:46 PM  Result Value Ref Range Status   Specimen Description SPUTUM  Final   Special Requests NONE Reflexed from [phone]  Final   Gram Stain   Final    ABUNDANT WBC PRESENT, PREDOMINANTLY PMN RARE GRAM POSITIVE COCCI IN PAIRS RARE BUDDING YEAST SEEN RARE GRAM POSITIVE RODS    Culture   Final    MODERATE Consistent with normal respiratory flora. Performed at [facility], [place] [address], [place], [place] [address]    Report Status 08/07/2018 FINAL  Final         Radiology Studies: Dg Chest Port 1 View  Result Date: 08/08/2018 CLINICAL DATA:  Left lung collapse EXAM: PORTABLE CHEST 1 VIEW COMPARISON:  08/07/2018 FINDINGS: Cardiac shadow remains enlarged. Defibrillator is again noted. There is been slight improved aeration in the left lung when compared with the prior exam although  significant opacification of left hemithorax remains. The right lung is clear. No acute bony abnormality is noted. IMPRESSION: Slight improved aeration on the left when compared with the prior exam. Electronically Signed   By: Inez Catalina M.D.   On: 08/08/2018 08:08   Dg Chest Portable 1 View  Result Date: 08/07/2018 CLINICAL DATA:  Atelectasis EXAM: PORTABLE CHEST 1 VIEW COMPARISON:  08/06/2018 FINDINGS: Near complete opacification in the left hemithorax is stable. Left subclavian AICD device and leads are stable and intact. Right lung is clear. No pneumothorax. IMPRESSION: Stable near complete opacification in the left hemithorax which is likely due to a combination of consolidation and volume loss. Electronically Signed   By: Marybelle Killings M.D.   On: 08/07/2018 09:20        Scheduled Meds: . acetylcysteine  2 mL Nebulization TID  . amiodarone  100 mg Oral q morning - 10a  . atorvastatin  20 mg Oral Daily  . calcium carbonate  1 tablet Oral Daily  . dorzolamide-timolol  1 drop Left Eye Daily  . enoxaparin (LOVENOX) injection  40 mg Subcutaneous Q24H  . feeding supplement (ENSURE ENLIVE)  237 mL Oral BID BM  . ferrous sulfate  325 mg Oral Q breakfast  . gabapentin  300 mg Oral TID  . guaiFENesin  1,200 mg Oral BID  . latanoprost  1 drop Left Eye QHS  . levothyroxine  137 mcg Oral Daily  . multivitamin with minerals  1 tablet Oral Daily  . sodium chloride HYPERTONIC  4 mL Nebulization Daily  . tamsulosin  0.4 mg Oral BID   Continuous Infusions: . piperacillin-tazobactam (ZOSYN)  IV 3.375 g (08/08/18 1527)     LOS: 7 days     [name], MD, FACP, [facility]. [facility] Pager [phone]  If 7PM-7AM, please contact night-coverage [email or web address] Password [email or web address] 08/08/2018, 3:30 PM

## 2018-08-08 NOTE — Progress Notes (Signed)
Pt refused CPT at this time due to pain

## 2018-08-08 NOTE — Progress Notes (Signed)
NAME:  Gregory [name], MRN:  [id number], DOB:  1932/12/11, LOS: 7 ADMISSION DATE:  07/31/2018, CONSULTATION DATE:  08/05/2018 REFERRING MD:  Loleta Books, CHIEF COMPLAINT:  Interval development of  Multifocal consolidations within the left upper and lower lobes,and abrupt occlusion of LLL, post ORIF right hip on 07/31/2018.  Brief History   Mr. Platten is a 84 y.o. M former smoker of pipe and cigars ( Quit 2006) with sCHF, EF 25%, ICD in place, Hypothyroidism, CKD III baseline Cr 1.5-2.0,  BPH, HTN, DM, and macrocytic anemia who presented with mechanical fall, right hip pain.  Found in the ER to have nondisplaced RIGHT intertrochanteric fracture.Pt had Closed right hip fracture s/p IM nail on 10/10. Chest X ray 10/14 showed Interval development of a large left pleural effusion with associated atelectasis or possible pneumonia, clear right lung. CT Chest done 10/14 showed mild mediastinal shift to the left consistent with volume loss. Multifocal consolidations within the left upper and lower lobes, suspect for pneumonia, with concern for underlying mass lesions due to extensive consolidations and  abrupt occlusion of the left lower lobe and lingular bronchi by complex secretions, debris, or tissue.  PCCM have been consulted to assist with management of this new pulmonary finding.  Past Medical History  Former smoker (Quit 2006) with sCHF, EF 25%, ICD in place, Hypothyroidism, CKD III baseline Cr 1.5-2.0,  BPH, HTN, DM,  macrocytic anemia , and recent hip fracture post fall with surgical repair. Significant Hospital Events   07/31/2018>> Admission with Fx hip 10/15 > L Sided collapse on CXR  Consults: date of consult/date signed off & final recs:  10/15>> Pulmonary  Procedures (surgical and bedside):    Significant Diagnostic Tests:  08/04/2018>>CT Chest w/o contrast Mild mediastinal shift to the left consistent with volume loss. Multifocal consolidations within the left upper and lower lobes,  suspect for pneumonia. Underlying or associated mass lesion is not excluded given the extensive consolidations present. There is abrupt occlusion of the left lower lobe and lingular bronchi by complex secretions, debris, or tissue. Additional finding of diffuse left pleural thickening, which could be secondary to infection or possible pleural mass. Mediastinal adenopathy which may be reactive or potentially due to metastatic disease. Cardiomegaly Suspected 3.4 cm right adrenal myelolipoma  CXR 10/17 reviewed by me: stable L sided collapse.   Micro Data:  08/04/2018>> Blood 08/04/2018>> Urine:  Pseudomonas 08/05/2018>> Sputum: normal flora Antimicrobials:  Zosyn 08/05/2018>> Azithromycin 10/14>> 10/15 Rocephin 10/14-10/15  Subjective:  "Feel the best I've felt since I've been here. Coughing up gobs and gobs of mucous"  Objective   Blood pressure 110/61, pulse 67, temperature 98.7 F (37.1 C), temperature source Axillary, resp. rate 14, height 5\' 6"  (1.676 m), weight 70.8 kg, SpO2 98 %.        Intake/Output Summary (Last 24 hours) at 08/08/2018 1123 Last data filed at 08/07/2018 1200 Gross per 24 hour  Intake -  Output 470 ml  Net -470 ml   Filed Weights   07/31/18 2201  Weight: 70.8 kg    Examination: General: elderly male in NAD HENT: [place]/AT, PERRL, no JVD Lungs: Diminished L base. NO distress.  Cardiovascular: RRR, no MRG Abdomen: Soft, NT, ND. Normoactive Extremities: Dressing to Right hip, No other obvious deformities Neuro: Alert and oriented, interactive. Non-focal Skin: Grossly intact with exception of surgical wound,    Resolved Hospital Problem list     Assessment & Plan:  84 year old severely physically debilitated man presenting with atelectasis and infiltrate  of the left lung, favored is atelectasis and mucous plugging given body habitus, position and inability to clear secretions.  Seems to hve developed rather quickly to represent mass.   Atelectasis as  above: - Aggressive pulmonary toilet - Chest vest - OOB to chair - Mucomyst and hypertonic nebs added.  - Mucinex - IS every hour - Flutter valve - Intermittent CXR - Symptomatically he is improving. Productive cough. Defer bronchoscopy.  - If develops distress position him with R lung down.   PNA: Unlikely without fever, WBC stable and PCT of 0.5 - ABX per primary  Hypoxemia: improved. Now on RA with sats 94% - Titrate O2 for sat of 88-95%  Pulmonary edema: - Diureses per primary team  Deconditioning: - Nutrition consult and PT as able   Disposition / Summary of Today's Plan 08/08/18   Feeling better. Now able to expectorate thick mucous.     Diet: Regular - soft Pain/Anxiety/Delirium protocol (if indicated): Hydrocodone DVT prophylaxis: Lovenox GI prophylaxis:  Hyperglycemia protocol: SSI Mobility: OOB to chair/ PT/OT Code Status: Full Family Communication: Patient updated 10/18  Labs   CBC: Recent Labs  Lab 08/04/18 0352 08/05/18 0206 08/06/18 0136 08/07/18 0159 08/08/18 0231  WBC 13.4* 11.5* 12.1* 10.6* 10.6*  HGB 9.6* 9.4* 8.5* 8.0* 8.3*  HCT 30.2* 29.2* 26.3* 25.5* 26.0*  MCV 98.4 99.0 98.9 98.5 99.2  PLT 152 164 174 191 841    Basic Metabolic Panel: Recent Labs  Lab 08/04/18 0352 08/05/18 0206 08/06/18 0136 08/07/18 0159 08/08/18 0231  NA 135 137 132* 130* 134*  K 4.9 5.3* 4.1 4.0 3.8  CL 99 98 94* 95* 97*  CO2 29 30 28 27 26   GLUCOSE 174* 132* 134* 167* 147*  BUN 35* 37* 41* 42* 42*  CREATININE 1.73* 1.70* 1.69* 1.55* 1.51*  CALCIUM 8.5* 8.9 8.5* 8.0* 8.2*   GFR: Estimated Creatinine Clearance: 34 mL/min (A) (by C-G formula based on SCr of 1.51 mg/dL (H)). Recent Labs  Lab 08/04/18 1144 08/05/18 0206 08/06/18 0136 08/07/18 0159 08/08/18 0231  PROCALCITON  --   --  0.25 0.28  --   WBC  --  11.5* 12.1* 10.6* 10.6*  LATICACIDVEN 1.0  --   --   --   --     Liver Function Tests: Recent Labs  Lab 08/05/18 0206  AST 28  ALT 9    ALKPHOS 83  BILITOT 0.8  PROT 6.3*  ALBUMIN 2.4*   No results for input(s): LIPASE, AMYLASE in the last 168 hours. No results for input(s): AMMONIA in the last 168 hours.  ABG    Component Value Date/Time   HCO3 26.5 (H) 11/25/2007 1222   TCO2 28 11/25/2007 1222     Coagulation Profile: No results for input(s): INR, PROTIME in the last 168 hours.  Cardiac Enzymes: No results for input(s): CKTOTAL, CKMB, CKMBINDEX, TROPONINI in the last 168 hours.  HbA1C: Hgb A1c MFr Bld  Date/Time Value Ref Range Status  12/31/2011 12:20 PM 9.3 (H) 4.6 - 6.5 % Final    Comment:    Glycemic Control Guidelines for People with Diabetes:Non Diabetic:  <6%Goal of Therapy: <7%Additional Action Suggested:  >8%     CBG: Recent Labs  Lab 08/02/18 2034 08/03/18 0014 08/03/18 0440 08/03/18 1148 08/03/18 1646  GLUCAP 112* 113* 109* 158* 175*    Admitting History of Present Illness.   Mr. Cates is a 84 y.o. M former smoker of pipe and cigars ( Quit 2006) with sCHF, EF 25%,  ICD in place, Hypothyroidism, CKD III baseline Cr 1.5-2.0,  BPH, HTN, DM, and macrocytic anemia who presented with mechanical fall, right hip pain.  Found in the ER to have nondisplaced RIGHT intertrochanteric fracture.Pt had Closed right hip fracture s/p IM nail on 10/10. Chest X ray 10/14 showed Interval development of a large left pleural effusion with associated atelectasis or possible pneumonia, clear right lung. CT Chest done 10/14 showed mild mediastinal shift to the left consistent with volume loss. Multifocal consolidations within the left upper and lower lobes, suspect for pneumonia, with concern for underlying mass lesions due to extensive consolidations and  abrupt occlusion of the left lower lobe and lingular bronchi by complex secretions, debris, or tissue.  PCCM have been consulted to assist with management of this new pulmonary finding.   Georgann Housekeeper, AGACNP-BC [place] Pager [phone]  or [phone]  08/08/2018 11:23 AM

## 2018-08-09 ENCOUNTER — Inpatient Hospital Stay (HOSPITAL_COMMUNITY): Payer: PPO

## 2018-08-09 LAB — CULTURE, BLOOD (ROUTINE X 2)
CULTURE: NO GROWTH
CULTURE: NO GROWTH
Special Requests: ADEQUATE
Special Requests: ADEQUATE

## 2018-08-09 LAB — GLUCOSE, CAPILLARY
GLUCOSE-CAPILLARY: 208 mg/dL — AB (ref 70–99)
GLUCOSE-CAPILLARY: 143 mg/dL — AB (ref 70–99)

## 2018-08-09 MED ORDER — INSULIN ASPART 100 UNIT/ML ~~LOC~~ SOLN
0.0000 [IU] | Freq: Three times a day (TID) | SUBCUTANEOUS | Status: DC
  Administered 2018-08-10: 1 [IU] via SUBCUTANEOUS
  Administered 2018-08-10: 2 [IU] via SUBCUTANEOUS

## 2018-08-09 NOTE — Progress Notes (Signed)
NAME:  Gregory [name], MRN:  [id number], DOB:  [birth year], LOS: 8 ADMISSION DATE:  07/31/2018, CONSULTATION DATE:  08/05/2018 REFERRING MD:  Loleta Books, CHIEF COMPLAINT:  Interval development of  Multifocal consolidations within the left upper and lower lobes,and abrupt occlusion of LLL, post ORIF right hip on 07/31/2018.  Brief History   Mr. Dutton is a 84 y.o. M former smoker of pipe and cigars ( Quit 2006) with sCHF, EF 25%, ICD in place, Hypothyroidism, CKD III baseline Cr 1.5-2.0,  BPH, HTN, DM, and macrocytic anemia who presented with mechanical fall, right hip pain.  Found in the ER to have nondisplaced RIGHT intertrochanteric fracture.Pt had Closed right hip fracture s/p IM nail on 10/10. Chest X ray 10/14 showed Interval development of a large left pleural effusion with associated atelectasis or possible pneumonia, clear right lung. CT Chest done 10/14 showed mild mediastinal shift to the left consistent with volume loss. Multifocal consolidations within the left upper and lower lobes, suspect for pneumonia, with concern for underlying mass lesions due to extensive consolidations and  abrupt occlusion of the left lower lobe and lingular bronchi by complex secretions, debris, or tissue.  PCCM have been consulted to assist with management of this new pulmonary finding.  Past Medical History  Former smoker (Quit 2006) with sCHF, EF 25%, ICD in place, Hypothyroidism, CKD III baseline Cr 1.5-2.0,  BPH, HTN, DM,  macrocytic anemia , and recent hip fracture post fall with surgical repair. Significant Hospital Events   07/31/2018>> Admission with Fx hip 10/15 > L Sided collapse on CXR  Consults: date of consult/date signed off & final recs:  10/15>> Pulmonary  Procedures (surgical and bedside):    Significant Diagnostic Tests:  08/04/2018>>CT Chest w/o contrast Mild mediastinal shift to the left consistent with volume loss. Multifocal consolidations within the left upper and lower lobes,  suspect for pneumonia. Underlying or associated mass lesion is not excluded given the extensive consolidations present. There is abrupt occlusion of the left lower lobe and lingular bronchi by complex secretions, debris, or tissue. Additional finding of diffuse left pleural thickening, which could be secondary to infection or possible pleural mass. Mediastinal adenopathy which may be reactive or potentially due to metastatic disease. Cardiomegaly Suspected 3.4 cm right adrenal myelolipoma  Chest x-ray 08/09/2018-improving aeration in the left.  There is persistent basal effusion.  I have reviewed the images personally.  Micro Data:  08/04/2018>> Blood 08/04/2018>> Urine:  Pseudomonas 08/05/2018>> Sputum: normal flora Antimicrobials:  Zosyn 08/05/2018>> Azithromycin 10/14>> 10/15 Rocephin 10/14-10/15  Subjective:  Continues to improve.  Cough is nonproductive no No dyspnea, fevers, chills.  Objective   Blood pressure (!) 110/58, pulse 75, temperature 97.7 F (36.5 C), temperature source Oral, resp. rate 18, height 5\' 6"  (1.676 m), weight 70.8 kg, SpO2 97 %.        Intake/Output Summary (Last 24 hours) at 08/09/2018 1254 Last data filed at 08/09/2018 1059 Gross per 24 hour  Intake 720 ml  Output 951 ml  Net -231 ml   Filed Weights   07/31/18 2201  Weight: 70.8 kg    Examination: Gen:      No acute distress HEENT:  EOMI, sclera anicteric Neck:     No masses; no thyromegaly Lungs:    Diminished breath sounds on the left. CV:         Regular rate and rhythm; no murmurs Abd:      + bowel sounds; soft, non-tender; no palpable masses, no distension Ext:  No edema; adequate peripheral perfusion Skin:      Warm and dry; no rash Neuro: alert and oriented x 3 Psych: normal mood and affect  Resolved Hospital Problem list     Assessment & Plan:  84 year old severely physically debilitated man presenting with atelectasis and infiltrate of the left lung, favored is atelectasis  and mucous plugging given body habitus, position and inability to clear secretions.  Seems to have developed rather quickly to represent mass.   Chest x-ray slowly improving but not lung is not completely opened up yet  Lt lung collapse Continue pulmonary toilet, chest PT, chest vest Mucomyst, saline nebs Follow chest x-ray  I would keep him in the hospital for 24 more hours.  Anticipate discharge tomorrow if his x-ray continues to improve.  Hypoxemia: improved. Now on RA with sats 94% - Titrate O2 for sat of 88-95%  Deconditioning: - Nutrition consult and PT as able   Disposition / Summary of Today's Plan 08/09/18   Feeling better. Now able to expectorate thick mucous.  Anticipate DC 10/20    Diet: Regular - soft Pain/Anxiety/Delirium protocol (if indicated): Hydrocodone DVT prophylaxis: Lovenox GI prophylaxis:  Hyperglycemia protocol: SSI Mobility: OOB to chair/ PT/OT Code Status: Full Family Communication: Patient updated 10/19  Labs   CBC: Recent Labs  Lab 08/04/18 0352 08/05/18 0206 08/06/18 0136 08/07/18 0159 08/08/18 0231  WBC 13.4* 11.5* 12.1* 10.6* 10.6*  HGB 9.6* 9.4* 8.5* 8.0* 8.3*  HCT 30.2* 29.2* 26.3* 25.5* 26.0*  MCV 98.4 99.0 98.9 98.5 99.2  PLT 152 164 174 191 283    Basic Metabolic Panel: Recent Labs  Lab 08/04/18 0352 08/05/18 0206 08/06/18 0136 08/07/18 0159 08/08/18 0231  NA 135 137 132* 130* 134*  K 4.9 5.3* 4.1 4.0 3.8  CL 99 98 94* 95* 97*  CO2 29 30 28 27 26   GLUCOSE 174* 132* 134* 167* 147*  BUN 35* 37* 41* 42* 42*  CREATININE 1.73* 1.70* 1.69* 1.55* 1.51*  CALCIUM 8.5* 8.9 8.5* 8.0* 8.2*   GFR: Estimated Creatinine Clearance: 34 mL/min (A) (by C-G formula based on SCr of 1.51 mg/dL (H)). Recent Labs  Lab 08/04/18 1144 08/05/18 0206 08/06/18 0136 08/07/18 0159 08/08/18 0231  PROCALCITON  --   --  0.25 0.28  --   WBC  --  11.5* 12.1* 10.6* 10.6*  LATICACIDVEN 1.0  --   --   --   --     Liver Function  Tests: Recent Labs  Lab 08/05/18 0206  AST 28  ALT 9  ALKPHOS 83  BILITOT 0.8  PROT 6.3*  ALBUMIN 2.4*   No results for input(s): LIPASE, AMYLASE in the last 168 hours. No results for input(s): AMMONIA in the last 168 hours.  ABG    Component Value Date/Time   HCO3 26.5 (H) 11/25/2007 1222   TCO2 28 11/25/2007 1222     Coagulation Profile: No results for input(s): INR, PROTIME in the last 168 hours.  Cardiac Enzymes: No results for input(s): CKTOTAL, CKMB, CKMBINDEX, TROPONINI in the last 168 hours.  HbA1C: Hgb A1c MFr Bld  Date/Time Value Ref Range Status  12/31/2011 12:20 PM 9.3 (H) 4.6 - 6.5 % Final    Comment:    Glycemic Control Guidelines for People with Diabetes:Non Diabetic:  <6%Goal of Therapy: <7%Additional Action Suggested:  >8%     CBG: Recent Labs  Lab 08/03/18 0014 08/03/18 0440 08/03/18 1148 08/03/18 1646 08/09/18 1153  GLUCAP 113* 109* 158* 175* 208*  Marshell Garfinkel MD [facility] 08/09/2018, 12:55 PM

## 2018-08-09 NOTE — Progress Notes (Signed)
Clinical Social Worker following patient for support and discharge needs. Patient has bed at [facility] but is awaiting authorization through [facility].   Rhea Pink, MSW,  [place]

## 2018-08-09 NOTE — Progress Notes (Signed)
PROGRESS NOTE    LARANCE RATLEDGE  SWN:[id number] DOB: 1933-07-30 DOA: 07/31/2018 PCP: Christain Sacramento, MD      Brief Narrative:  Mr. Miyamoto is a 84 y.o. M with chronic systolic CHF, EF 93%, ICD in place, Hypothyroidism, CKD III baseline Cr 1.5-2.0,  BPH, HTN, DM, and macrocytic anemia who presented with mechanical fall, right hip pain.  Found in the ER to have nondisplaced RIGHT intertrochanteric fracture.  Status post IM nail 10/10. Hospitalization now complicated by post-operative pneumonia versus atelectasis due to mucous plugging and hypoxia.  Pulmonology consulted.  Improving.  Possible DC to SNF 10/20.   Assessment & Plan:  Closed right hip fracture, S/p IM nail on 10/10 Patient underwent procedure after cardiology preop clearance.  Orthopedics assisting with care.  They recommend WBAT RLE, Lovenox for DVT prophylaxis.  Outpatient follow-up with orthopedics. Stable.  Atelectasis versus HCAP (felt less likely) complicated by acute hypoxic respiratory failure - Over the weekend, patient developed progressive sluggishness, new leukocytosis, hypoxia.  Chest x-ray yesterday showed airspace disease in the left, initially thought to be effusion. - thoracentesis attempted but no significant tappable effusion was seen on ultrasound.   - Follow-up CT scan showed possible left mainstem bronchus occlusion with debris, multifocal pneumonia. - SLP note no overt aspiration, cannot rule out silent aspiration or previous one time event, although no event noted over weekend. - He had been weaned to room air yesterday but back on oxygen despite saturations in the 90s.  Monitor off of oxygen and aim for oxygen saturation 88-95%. - Aspiration precautions - Pulmonary consultation and follow-up appreciated.  They suspect atelectasis and mucous plugging given body habitus, position and inability to clear secretions rather than pneumonia. - Continue aggressive pulmonary toilet, chest PT, mobilize,  incentive spirometer, flutter valve and follow-up serial chest x-rays.  Continuing to hold off bronchoscopy for now due to concern that he may need intubation if it is done. - As discussed with pulmonology, discontinued antibiotics.  Sputum culture showed normal respiratory flora. Continuing pulmonary toilet, chest vest, PT, mobilize.  I personally reviewed chest x-ray from 10/19 and left lung fields aeration continues to progressively improve.  As communicated with pulmonology, if chest x-ray better, possible DC 10/20.  Pseudomonas CAUTI Pain with foley catheter on 10/14.  Foley replaced, new urine culture growing Pseudomonas.   -Completed 3 days of IV Zosyn.  Discontinued antibiotics.  Acute on chronic macrocytic anemia Baseline macrocytosis, unclear cause of baseline anemia. Now with superimposed acute blood loss anemia.   B12 normal.  No clinical bleeding.  Transfused 1 unit on 10/13, appropriate post-transfusion increase in Hgb.  Hemoglobin has gradually drifted down to 8.?  Critical illness. -Transfusion threshold 8 g/dL -Continue iron  Delirium Resolved.  Chronic systolic CHF Hypertenion EF 25% in 2015.  No signs of hypervolemia. -Strict I/Os -Continue amiodarone, statin -Holding carvedilol, ramipril and furosemide 40. -Consider resuming these at discharge BP permitting.  Blood pressure controlled off of medications.  Reassess need for above medications at discharge.  BPH Urinary retention Failed voiding trial over weekend, Foley placed 10/12, cultured on 10/14 with Pseudomonas and now replaced.   -Continue BID Flomax -As per RN, failed voiding trial again on 10/15.  Will need to discharge to SNF with Foley catheter and outpatient follow-up with urology in 2 weeks.  Diabetes type II No hyperglycemia -   Mildly uncontrolled and fluctuating. No SSI seen, added. -Continue gabapentin  Other medications -Continue eye drops  Hypothyroidism -Continue levothyroxine  CKD IV  Cr no change.  Creatinine down to 1.5.  Hard of hearing States that his hearing aid has run out of battery and needs replacement.  Right eye blindness/enucleated.   DVT prophylaxis: Lovenox Code Status: FULL Family Communication: None at bedside today. MDM and disposition Plan: DC to SNF possibly 10/20 pending further improvement in chest x-ray.     Consultants:   Orthopedics  Pulmonology  Procedures:   IM Nail 10/10  Antimicrobials:  Ceftriaxone 10/14  Azithromycin 10/14  Zosyn 10/15 >> 10/18  Culture data:  10/14 blood culture x2: NGTD  10/14 Urine culture: Pseudomonas  10/15 Sputum culture: pending      Subjective: Cough improved, not much production.  Dyspnea resolved.  Denies any other complaints.  Asking if it is cold outside.  Objective: Vitals:   08/08/18 2025 08/09/18 0457 08/09/18 0826 08/09/18 1230  BP: (!) 123/59 (!) 121/55  (!) 110/58  Pulse: 67 69  75  Resp:    18  Temp: 98.3 F (36.8 C) 98.2 F (36.8 C)  97.7 F (36.5 C)  TempSrc: Oral Oral  Oral  SpO2: 97% 96% 98% 97%  Weight:      Height:        Intake/Output Summary (Last 24 hours) at 08/09/2018 1608 Last data filed at 08/09/2018 1416 Gross per 24 hour  Intake 600 ml  Output 951 ml  Net -351 ml   Filed Weights   07/31/18 2201  Weight: 70.8 kg    Examination: General appearance: Pleasant elderly male, moderately built and nourished, lying comfortably propped up in bed without distress.  Has right hearing aid.  Appears in good spirits. Eyes: The right eye is atrophied, this is congenital.  Has a patch over right eye. The left eye appears normal.   Cardiac: Heart rate regular, soft systolic murmur, JVP normal, no lower extremity edema.  Stable. Respiratory: Improved breath sounds left upper lung fields.  Decreased breath sounds in the left base.  Right lung fields clear to auscultation.  No wheezing, rhonchi or crackles appreciated.  No increased work of  breathing. Abdomen: Abdomen soft without tenderness to palpation, ascites, distention, hepatospleno megaly.  Normal bowel sounds heard.  Stable. Neuro: Alert and oriented.  No focal neurological deficits.  Stable. Psych: Judgment and insight appear impaired.  Mood and affect appear appropriate. Extremities: Symmetric 5 x 5 power.  Right hip surgical site without acute findings. GU: Foley catheter +.    Data Reviewed: I have personally reviewed following labs and imaging studies:  CBC: Recent Labs  Lab 08/04/18 0352 08/05/18 0206 08/06/18 0136 08/07/18 0159 08/08/18 0231  WBC 13.4* 11.5* 12.1* 10.6* 10.6*  HGB 9.6* 9.4* 8.5* 8.0* 8.3*  HCT 30.2* 29.2* 26.3* 25.5* 26.0*  MCV 98.4 99.0 98.9 98.5 99.2  PLT 152 164 174 191 867   Basic Metabolic Panel: Recent Labs  Lab 08/04/18 0352 08/05/18 0206 08/06/18 0136 08/07/18 0159 08/08/18 0231  NA 135 137 132* 130* 134*  K 4.9 5.3* 4.1 4.0 3.8  CL 99 98 94* 95* 97*  CO2 29 30 28 27 26   GLUCOSE 174* 132* 134* 167* 147*  BUN 35* 37* 41* 42* 42*  CREATININE 1.73* 1.70* 1.69* 1.55* 1.51*  CALCIUM 8.5* 8.9 8.5* 8.0* 8.2*   GFR: Estimated Creatinine Clearance: 34 mL/min (A) (by C-G formula based on SCr of 1.51 mg/dL (H)). Liver Function Tests: Recent Labs  Lab 08/05/18 0206  AST 28  ALT 9  ALKPHOS 83  BILITOT 0.8  PROT 6.3*  ALBUMIN 2.4*   Coagulation Profile: No results for input(s): INR, PROTIME in the last 168 hours. CBG: Recent Labs  Lab 08/03/18 0014 08/03/18 0440 08/03/18 1148 08/03/18 1646 08/09/18 1153  GLUCAP 113* 109* 158* 175* 208*   Urine analysis:    Component Value Date/Time   COLORURINE YELLOW 08/04/2018 [place] 08/04/2018 1246   LABSPEC 1.016 08/04/2018 1246   PHURINE 5.0 08/04/2018 1246   GLUCOSEU NEGATIVE 08/04/2018 1246   HGBUR MODERATE (A) 08/04/2018 1246   BILIRUBINUR NEGATIVE 08/04/2018 1246   [place] 08/04/2018 1246   PROTEINUR NEGATIVE 08/04/2018 1246    NITRITE NEGATIVE 08/04/2018 1246   LEUKOCYTESUR TRACE (A) 08/04/2018 1246    Recent Results (from the past 240 hour(s))  Surgical pcr screen     Status: None   Collection Time: 08/01/18  2:55 AM  Result Value Ref Range Status   MRSA, PCR NEGATIVE NEGATIVE Final   Staphylococcus aureus NEGATIVE NEGATIVE Final    Comment: (NOTE) The Xpert SA Assay (FDA approved for NASAL specimens in patients 23 years of age and older), is one component of a comprehensive surveillance program. It is not intended to diagnose infection nor to guide or monitor treatment. Performed at [facility], [place] [address], [place], [place] [address]   Culture, blood (routine x 2)     Status: None   Collection Time: 08/04/18 12:02 PM  Result Value Ref Range Status   Specimen Description BLOOD SITE NOT SPECIFIED  Final   Special Requests   Final    BOTTLES DRAWN AEROBIC AND ANAEROBIC Blood Culture adequate volume   Culture   Final    NO GROWTH 5 DAYS Performed at [facility], [address], [place], [place] [address]    Report Status 08/09/2018 FINAL  Final  Culture, blood (routine x 2)     Status: None   Collection Time: 08/04/18 12:02 PM  Result Value Ref Range Status   Specimen Description BLOOD RIGHT HAND  Final   Special Requests   Final    BOTTLES DRAWN AEROBIC AND ANAEROBIC Blood Culture adequate volume   Culture   Final    NO GROWTH 5 DAYS Performed at [facility], [place] [address], [place],  [address]    Report Status 08/09/2018 FINAL  Final  Culture, Urine     Status: Abnormal   Collection Time: 08/04/18 12:38 PM  Result Value Ref Range Status   Specimen Description URINE, CATHETERIZED  Final   Special Requests   Final    NONE Performed at [facility], [place] [address] [place], [place] [address]    Culture >=100,000 COLONIES/mL PSEUDOMONAS AERUGINOSA (A)  Final   Report Status 08/06/2018 FINAL  Final   Organism ID, Bacteria PSEUDOMONAS AERUGINOSA (A)  Final       Susceptibility   Pseudomonas aeruginosa - MIC*    CEFTAZIDIME 4 SENSITIVE Sensitive     CIPROFLOXACIN <=0.25 SENSITIVE Sensitive     GENTAMICIN 2 SENSITIVE Sensitive     IMIPENEM 2 SENSITIVE Sensitive     PIP/TAZO 32 SENSITIVE Sensitive     CEFEPIME 2 SENSITIVE Sensitive     * >=100,000 COLONIES/mL PSEUDOMONAS AERUGINOSA  Expectorated sputum assessment w rflx to resp cult     Status: None   Collection Time: 08/05/18 12:46 PM  Result Value Ref Range Status   Specimen Description SPUTUM  Final   Special Requests NONE  Final   Sputum evaluation  Final    THIS SPECIMEN IS ACCEPTABLE FOR SPUTUM CULTURE Performed at [facility], [place] [address], [place], [place] [address]    Report Status 08/05/2018 FINAL  Final  Culture, respiratory     Status: None   Collection Time: 08/05/18 12:46 PM  Result Value Ref Range Status   Specimen Description SPUTUM  Final   Special Requests NONE Reflexed from [id number]  Final   Gram Stain   Final    ABUNDANT WBC PRESENT, PREDOMINANTLY PMN RARE GRAM POSITIVE COCCI IN PAIRS RARE BUDDING YEAST SEEN RARE GRAM POSITIVE RODS    Culture   Final    MODERATE Consistent with normal respiratory flora. Performed at [facility], [place] [address] [place], [place] [address]    Report Status 08/07/2018 FINAL  Final         Radiology Studies: Dg Chest Port 1 View  Result Date: 08/09/2018 CLINICAL DATA:  Acute respiratory failure. EXAM: PORTABLE CHEST 1 VIEW COMPARISON:  One-view chest x-ray 08/08/2018 FINDINGS: The heart is enlarged. Aortic atherosclerosis is present. Pacing and defibrillator wires are stable. There is some previous aeration in the left lung diffuse asymmetric opacity. A left pleural effusion is again seen. The right lung is clear. IMPRESSION: 1. Previous aeration of the left lung. 2. Persistent diffuse airspace disease consistent with resolving pneumonia and effusion. Electronically Signed   By: San Morelle M.D.   On:  08/09/2018 13:21   Dg Chest Port 1 View  Result Date: 08/08/2018 CLINICAL DATA:  Left lung collapse EXAM: PORTABLE CHEST 1 VIEW COMPARISON:  08/07/2018 FINDINGS: Cardiac shadow remains enlarged. Defibrillator is again noted. There is been slight improved aeration in the left lung when compared with the prior exam although significant opacification of left hemithorax remains. The right lung is clear. No acute bony abnormality is noted. IMPRESSION: Slight improved aeration on the left when compared with the prior exam. Electronically Signed   By: Inez Catalina M.D.   On: 08/08/2018 08:08        Scheduled Meds: . acetylcysteine  2 mL Nebulization TID  . amiodarone  100 mg Oral q morning - 10a  . atorvastatin  20 mg Oral Daily  . calcium carbonate  1 tablet Oral Daily  . dorzolamide-timolol  1 drop Left Eye Daily  . enoxaparin (LOVENOX) injection  40 mg Subcutaneous Q24H  . feeding supplement (ENSURE ENLIVE)  237 mL Oral BID BM  . ferrous sulfate  325 mg Oral Q breakfast  . gabapentin  300 mg Oral TID  . guaiFENesin  1,200 mg Oral BID  . latanoprost  1 drop Left Eye QHS  . levothyroxine  137 mcg Oral Daily  . multivitamin with minerals  1 tablet Oral Daily  . sodium chloride HYPERTONIC  4 mL Nebulization Daily  . tamsulosin  0.4 mg Oral BID   Continuous Infusions:    LOS: 8 days     [name], MD, FACP, [facility]. [facility] Pager [phone]  If 7PM-7AM, please contact night-coverage [email or web address] Password [email or web address] 08/09/2018, 4:08 PM

## 2018-08-10 ENCOUNTER — Inpatient Hospital Stay (HOSPITAL_COMMUNITY): Payer: PPO

## 2018-08-10 DIAGNOSIS — N390 Urinary tract infection, site not specified: Secondary | ICD-10-CM | POA: Diagnosis not present

## 2018-08-10 DIAGNOSIS — E11621 Type 2 diabetes mellitus with foot ulcer: Secondary | ICD-10-CM | POA: Diagnosis not present

## 2018-08-10 DIAGNOSIS — R278 Other lack of coordination: Secondary | ICD-10-CM | POA: Diagnosis not present

## 2018-08-10 DIAGNOSIS — J9811 Atelectasis: Secondary | ICD-10-CM | POA: Diagnosis not present

## 2018-08-10 DIAGNOSIS — R2689 Other abnormalities of gait and mobility: Secondary | ICD-10-CM | POA: Diagnosis not present

## 2018-08-10 DIAGNOSIS — J9601 Acute respiratory failure with hypoxia: Secondary | ICD-10-CM | POA: Diagnosis not present

## 2018-08-10 DIAGNOSIS — E1159 Type 2 diabetes mellitus with other circulatory complications: Secondary | ICD-10-CM | POA: Diagnosis not present

## 2018-08-10 DIAGNOSIS — E1140 Type 2 diabetes mellitus with diabetic neuropathy, unspecified: Secondary | ICD-10-CM | POA: Diagnosis not present

## 2018-08-10 DIAGNOSIS — S72001D Fracture of unspecified part of neck of right femur, subsequent encounter for closed fracture with routine healing: Secondary | ICD-10-CM | POA: Diagnosis not present

## 2018-08-10 DIAGNOSIS — S72001A Fracture of unspecified part of neck of right femur, initial encounter for closed fracture: Secondary | ICD-10-CM | POA: Diagnosis not present

## 2018-08-10 DIAGNOSIS — N184 Chronic kidney disease, stage 4 (severe): Secondary | ICD-10-CM | POA: Diagnosis not present

## 2018-08-10 DIAGNOSIS — I10 Essential (primary) hypertension: Secondary | ICD-10-CM | POA: Diagnosis not present

## 2018-08-10 DIAGNOSIS — E039 Hypothyroidism, unspecified: Secondary | ICD-10-CM

## 2018-08-10 DIAGNOSIS — L89610 Pressure ulcer of right heel, unstageable: Secondary | ICD-10-CM | POA: Diagnosis not present

## 2018-08-10 DIAGNOSIS — I119 Hypertensive heart disease without heart failure: Secondary | ICD-10-CM | POA: Diagnosis not present

## 2018-08-10 DIAGNOSIS — L89312 Pressure ulcer of right buttock, stage 2: Secondary | ICD-10-CM | POA: Diagnosis not present

## 2018-08-10 DIAGNOSIS — J9600 Acute respiratory failure, unspecified whether with hypoxia or hypercapnia: Secondary | ICD-10-CM | POA: Diagnosis not present

## 2018-08-10 DIAGNOSIS — N138 Other obstructive and reflux uropathy: Secondary | ICD-10-CM | POA: Diagnosis not present

## 2018-08-10 DIAGNOSIS — D539 Nutritional anemia, unspecified: Secondary | ICD-10-CM | POA: Diagnosis not present

## 2018-08-10 DIAGNOSIS — R41841 Cognitive communication deficit: Secondary | ICD-10-CM | POA: Diagnosis not present

## 2018-08-10 DIAGNOSIS — N401 Enlarged prostate with lower urinary tract symptoms: Secondary | ICD-10-CM | POA: Diagnosis not present

## 2018-08-10 DIAGNOSIS — E440 Moderate protein-calorie malnutrition: Secondary | ICD-10-CM | POA: Diagnosis not present

## 2018-08-10 DIAGNOSIS — Z7401 Bed confinement status: Secondary | ICD-10-CM | POA: Diagnosis not present

## 2018-08-10 DIAGNOSIS — I5022 Chronic systolic (congestive) heart failure: Secondary | ICD-10-CM | POA: Diagnosis not present

## 2018-08-10 DIAGNOSIS — E119 Type 2 diabetes mellitus without complications: Secondary | ICD-10-CM | POA: Diagnosis not present

## 2018-08-10 DIAGNOSIS — S72146D Nondisplaced intertrochanteric fracture of unspecified femur, subsequent encounter for closed fracture with routine healing: Secondary | ICD-10-CM | POA: Diagnosis not present

## 2018-08-10 DIAGNOSIS — E1122 Type 2 diabetes mellitus with diabetic chronic kidney disease: Secondary | ICD-10-CM | POA: Diagnosis not present

## 2018-08-10 DIAGNOSIS — R0902 Hypoxemia: Secondary | ICD-10-CM | POA: Diagnosis not present

## 2018-08-10 DIAGNOSIS — J189 Pneumonia, unspecified organism: Secondary | ICD-10-CM | POA: Diagnosis not present

## 2018-08-10 DIAGNOSIS — L03116 Cellulitis of left lower limb: Secondary | ICD-10-CM | POA: Diagnosis not present

## 2018-08-10 DIAGNOSIS — N139 Obstructive and reflux uropathy, unspecified: Secondary | ICD-10-CM | POA: Diagnosis not present

## 2018-08-10 DIAGNOSIS — M2550 Pain in unspecified joint: Secondary | ICD-10-CM | POA: Diagnosis not present

## 2018-08-10 DIAGNOSIS — N183 Chronic kidney disease, stage 3 (moderate): Secondary | ICD-10-CM | POA: Diagnosis not present

## 2018-08-10 DIAGNOSIS — E785 Hyperlipidemia, unspecified: Secondary | ICD-10-CM | POA: Diagnosis not present

## 2018-08-10 DIAGNOSIS — I255 Ischemic cardiomyopathy: Secondary | ICD-10-CM | POA: Diagnosis not present

## 2018-08-10 DIAGNOSIS — I25810 Atherosclerosis of coronary artery bypass graft(s) without angina pectoris: Secondary | ICD-10-CM | POA: Diagnosis not present

## 2018-08-10 DIAGNOSIS — I509 Heart failure, unspecified: Secondary | ICD-10-CM | POA: Diagnosis not present

## 2018-08-10 DIAGNOSIS — D62 Acute posthemorrhagic anemia: Secondary | ICD-10-CM | POA: Diagnosis not present

## 2018-08-10 DIAGNOSIS — T83511D Infection and inflammatory reaction due to indwelling urethral catheter, subsequent encounter: Secondary | ICD-10-CM | POA: Diagnosis not present

## 2018-08-10 DIAGNOSIS — R339 Retention of urine, unspecified: Secondary | ICD-10-CM | POA: Diagnosis not present

## 2018-08-10 LAB — GLUCOSE, CAPILLARY
GLUCOSE-CAPILLARY: 135 mg/dL — AB (ref 70–99)
GLUCOSE-CAPILLARY: 131 mg/dL — AB (ref 70–99)
Glucose-Capillary: 187 mg/dL — ABNORMAL HIGH (ref 70–99)

## 2018-08-10 MED ORDER — ENSURE ENLIVE PO LIQD: 237.0000 mL | Freq: Two times a day (BID) | ORAL | Status: AC

## 2018-08-10 MED ORDER — AMIODARONE HCL 100 MG PO TABS: 100.0000 mg | ORAL_TABLET | Freq: Every morning | ORAL | Status: DC

## 2018-08-10 MED ORDER — INSULIN ASPART 100 UNIT/ML ~~LOC~~ SOLN: 0.0000 [IU] | Freq: Three times a day (TID) | SUBCUTANEOUS | Status: AC

## 2018-08-10 MED ORDER — GUAIFENESIN ER 600 MG PO TB12: 1200.0000 mg | ORAL_TABLET | Freq: Two times a day (BID) | ORAL | Status: AC

## 2018-08-10 MED ORDER — ENOXAPARIN SODIUM 40 MG/0.4ML ~~LOC~~ SOLN: 40.0000 mg | SUBCUTANEOUS | Status: AC

## 2018-08-10 NOTE — Progress Notes (Signed)
Review report with PTAR and gave report to nurse at facility. Also notified family

## 2018-08-10 NOTE — Progress Notes (Signed)
CSW called [employer] to inquire regarding insurance authorization that was started yesterday.   Initial authorization was started when patient entered hospital however due to continued medical workup was closed out as authorization only lasts 3-4 days and patient had an unexpected extended stay.   [employer] did not answer this morning, CSW left voicemail requiring a call back.   CSW called again this afternoon, no answer once again going straight to voicemail.   CSW will continue to follow up with [employer] for insurance authorization. Confirmed with [facility] they still have a bed for patient and are ready for when insurance goes through.   [place], [place]

## 2018-08-10 NOTE — Progress Notes (Signed)
PROGRESS NOTE    Gregory [name]  RFF:[id number] DOB: [birth year] DOA: 07/31/2018 PCP: Christain Sacramento, MD      Brief Narrative:  Gregory [name] is a 84 y.o. M with chronic systolic CHF, EF 35%, ICD in place, Hypothyroidism, CKD III baseline Cr 1.5-2.0,  BPH, HTN, DM, and macrocytic anemia who presented with mechanical fall, right hip pain.  Found in the ER to have nondisplaced RIGHT intertrochanteric fracture.  Status post IM nail 10/10. Hospitalization now complicated by post-operative pneumonia versus atelectasis due to mucous plugging and hypoxia.  Pulmonology consulted.  Improving.  Patient is medically ready for DC to SNF 10/20 but no insurance approval as per clinical social workers update.  Likely DC 10/21 pending insurance approval.   Assessment & Plan:  Closed right hip fracture, S/p IM nail on 10/10 Patient underwent procedure after cardiology preop clearance.  Orthopedics assisting with care.  They recommend WBAT RLE, Lovenox for DVT prophylaxis.  Outpatient follow-up with orthopedics. Stable.  Atelectasis versus HCAP (felt less likely) complicated by acute hypoxic respiratory failure - Over the weekend, patient developed progressive sluggishness, new leukocytosis, hypoxia.  Chest x-ray yesterday showed airspace disease in the left, initially thought to be effusion. - thoracentesis attempted but no significant tappable effusion was seen on ultrasound.   - Follow-up CT scan showed possible left mainstem bronchus occlusion with debris, multifocal pneumonia. - SLP note no overt aspiration, cannot rule out silent aspiration or previous one time event, although no event noted over weekend. - He had been weaned to room air yesterday but back on oxygen despite saturations in the 90s.  Monitor off of oxygen and aim for oxygen saturation 88-95%. - Aspiration precautions - Pulmonary consultation and follow-up appreciated.  They suspect atelectasis and mucous plugging given body habitus,  position and inability to clear secretions rather than pneumonia. - Continue aggressive pulmonary toilet, chest PT, mobilize, incentive spirometer, flutter valve and follow-up serial chest x-rays.  Continuing to hold off bronchoscopy for now due to concern that he may need intubation if it is done. - As discussed with pulmonology, discontinued antibiotics.  Sputum culture showed normal respiratory flora. Continuing pulmonary toilet, chest vest, PT, mobilize.  I personally reviewed chest x-ray from 10/20 and left lung fields aeration continues to progressively improve.  As communicated with pulmonology, cleared for DC to SNF but unable to go due to lack of insurance approval.  Follow chest x-ray in a.m.  Pseudomonas CAUTI Pain with foley catheter on 10/14.  Foley replaced, new urine culture growing Pseudomonas.   -Completed 3 days of IV Zosyn.  Discontinued antibiotics.  Acute on chronic macrocytic anemia Baseline macrocytosis, unclear cause of baseline anemia. Now with superimposed acute blood loss anemia.   B12 normal.  No clinical bleeding.  Transfused 1 unit on 10/13, appropriate post-transfusion increase in Hgb.  Hemoglobin has gradually drifted down to 8.?  Critical illness. -Transfusion threshold 8 g/dL -Continue iron  Delirium Resolved.  Chronic systolic CHF Hypertenion EF 25% in 2015.  No signs of hypervolemia. -Strict I/Os -Continue amiodarone, statin -Holding carvedilol, ramipril and furosemide 40. -Consider resuming these at discharge BP permitting.  Blood pressure controlled off of medications.  Reassess need for above medications at discharge.  BPH Urinary retention Failed voiding trial over weekend, Foley placed 10/12, cultured on 10/14 with Pseudomonas and now replaced.   -Continue BID Flomax -As per RN, failed voiding trial again on 10/15.  Will need to discharge to SNF with Foley catheter and outpatient follow-up with urology  in 2 weeks.  Diabetes type II No  hyperglycemia -   Mildly uncontrolled and fluctuating. No SSI seen, added. -Continue gabapentin  Other medications -Continue eye drops  Hypothyroidism -Continue levothyroxine  CKD IV  Cr no change.  Creatinine down to 1.5.  Hard of hearing States that his hearing aid has run out of battery and needs replacement.  Right eye blindness/enucleated.   DVT prophylaxis: Lovenox Code Status: FULL Family Communication: None at bedside today. MDM and disposition Plan: DC to SNF pending insurance approval.     Consultants:   Orthopedics  Pulmonology  Procedures:   IM Nail 10/10  Antimicrobials:  Ceftriaxone 10/14  Azithromycin 10/14  Zosyn 10/15 >> 10/18  Culture data:  10/14 blood culture x2: NGTD  10/14 Urine culture: Pseudomonas  10/15 Sputum culture: pending      Subjective: Reports that he had a coughing spell last night and brought up "globs" of light yellow phlegm.  No dyspnea.  Breathing at baseline.  Denies any other complaints.  Objective: Vitals:   08/09/18 1652 08/09/18 1941 08/10/18 0730 08/10/18 0800  BP:  131/68 (!) 109/41 (!) 109/41  Pulse:  77 72   Resp:      Temp:  98.7 F (37.1 C)    TempSrc:  Oral    SpO2: 98% 94%    Weight:      Height:        Intake/Output Summary (Last 24 hours) at 08/10/2018 1358 Last data filed at 08/10/2018 0610 Gross per 24 hour  Intake 480 ml  Output 800 ml  Net -320 ml   Filed Weights   07/31/18 2201  Weight: 70.8 kg    Examination: No significant change in clinical exam compared to yesterday. General appearance: Pleasant elderly male, moderately built and nourished, lying comfortably propped up in bed without distress.  Has right hearing aid.  Appears in good spirits. Eyes: The right eye is atrophied, this is congenital.  Has a patch over right eye. The left eye appears normal.   Cardiac: Heart rate regular, soft systolic murmur, JVP normal, no lower extremity edema.  Stable. Respiratory:  Improved breath sounds left upper lung fields.  Decreased breath sounds in the left base.  Right lung fields clear to auscultation.  No wheezing, rhonchi or crackles appreciated.  No increased work of breathing. Abdomen: Abdomen soft without tenderness to palpation, ascites, distention, hepatospleno megaly.  Normal bowel sounds heard.  Stable. Neuro: Alert and oriented.  No focal neurological deficits.  Stable. Psych: Judgment and insight appear impaired.  Mood and affect appear appropriate. Extremities: Symmetric 5 x 5 power.  Right hip surgical site without acute findings. GU: Foley catheter +.    Data Reviewed: I have personally reviewed following labs and imaging studies:  CBC: Recent Labs  Lab 08/04/18 0352 08/05/18 0206 08/06/18 0136 08/07/18 0159 08/08/18 0231  WBC 13.4* 11.5* 12.1* 10.6* 10.6*  HGB 9.6* 9.4* 8.5* 8.0* 8.3*  HCT 30.2* 29.2* 26.3* 25.5* 26.0*  MCV 98.4 99.0 98.9 98.5 99.2  PLT 152 164 174 191 101   Basic Metabolic Panel: Recent Labs  Lab 08/04/18 0352 08/05/18 0206 08/06/18 0136 08/07/18 0159 08/08/18 0231  NA 135 137 132* 130* 134*  K 4.9 5.3* 4.1 4.0 3.8  CL 99 98 94* 95* 97*  CO2 29 30 28 27 26   GLUCOSE 174* 132* 134* 167* 147*  BUN 35* 37* 41* 42* 42*  CREATININE 1.73* 1.70* 1.69* 1.55* 1.51*  CALCIUM 8.5* 8.9 8.5* 8.0* 8.2*  GFR: Estimated Creatinine Clearance: 34 mL/min (A) (by C-G formula based on SCr of 1.51 mg/dL (H)). Liver Function Tests: Recent Labs  Lab 08/05/18 0206  AST 28  ALT 9  ALKPHOS 83  BILITOT 0.8  PROT 6.3*  ALBUMIN 2.4*   Coagulation Profile: No results for input(s): INR, PROTIME in the last 168 hours. CBG: Recent Labs  Lab 08/03/18 1646 08/09/18 1153 08/09/18 2128 08/10/18 0646 08/10/18 1142  GLUCAP 175* 208* 143* 135* 187*   Urine analysis:    Component Value Date/Time   COLORURINE YELLOW 08/04/2018 [place] 08/04/2018 1246   LABSPEC 1.016 08/04/2018 1246   PHURINE 5.0  08/04/2018 1246   GLUCOSEU NEGATIVE 08/04/2018 1246   HGBUR MODERATE (A) 08/04/2018 1246   BILIRUBINUR NEGATIVE 08/04/2018 1246   [place] 08/04/2018 1246   PROTEINUR NEGATIVE 08/04/2018 1246   NITRITE NEGATIVE 08/04/2018 1246   LEUKOCYTESUR TRACE (A) 08/04/2018 1246    Recent Results (from the past 240 hour(s))  Surgical pcr screen     Status: None   Collection Time: 08/01/18  2:55 AM  Result Value Ref Range Status   MRSA, PCR NEGATIVE NEGATIVE Final   Staphylococcus aureus NEGATIVE NEGATIVE Final    Comment: (NOTE) The Xpert SA Assay (FDA approved for NASAL specimens in patients 56 years of age and older), is one component of a comprehensive surveillance program. It is not intended to diagnose infection nor to guide or monitor treatment. Performed at [facility], [place] [address] [place], [place] [address]   Culture, blood (routine x 2)     Status: None   Collection Time: 08/04/18 12:02 PM  Result Value Ref Range Status   Specimen Description BLOOD SITE NOT SPECIFIED  Final   Special Requests   Final    BOTTLES DRAWN AEROBIC AND ANAEROBIC Blood Culture adequate volume   Culture   Final    NO GROWTH 5 DAYS Performed at [facility], [address] [place], [place] [address]    Report Status 08/09/2018 FINAL  Final  Culture, blood (routine x 2)     Status: None   Collection Time: 08/04/18 12:02 PM  Result Value Ref Range Status   Specimen Description BLOOD RIGHT HAND  Final   Special Requests   Final    BOTTLES DRAWN AEROBIC AND ANAEROBIC Blood Culture adequate volume   Culture   Final    NO GROWTH 5 DAYS Performed at [facility], [place] [address] [place], [place] [address]    Report Status 08/09/2018 FINAL  Final  Culture, Urine     Status: Abnormal   Collection Time: 08/04/18 12:38 PM  Result Value Ref Range Status   Specimen Description URINE, CATHETERIZED  Final   Special Requests   Final    NONE Performed at [place], [place] [address] [place], [place] [address]    Culture >=100,000 COLONIES/mL PSEUDOMONAS AERUGINOSA (A)  Final   Report Status 08/06/2018 FINAL  Final   Organism ID, Bacteria PSEUDOMONAS AERUGINOSA (A)  Final      Susceptibility   Pseudomonas aeruginosa - MIC*    CEFTAZIDIME 4 SENSITIVE Sensitive     CIPROFLOXACIN <=0.25 SENSITIVE Sensitive     GENTAMICIN 2 SENSITIVE Sensitive     IMIPENEM 2 SENSITIVE Sensitive     PIP/TAZO 32 SENSITIVE Sensitive     CEFEPIME 2 SENSITIVE Sensitive     * >=100,000 COLONIES/mL PSEUDOMONAS AERUGINOSA  Expectorated sputum assessment w rflx  to resp cult     Status: None   Collection Time: 08/05/18 12:46 PM  Result Value Ref Range Status   Specimen Description SPUTUM  Final   Special Requests NONE  Final   Sputum evaluation   Final    THIS SPECIMEN IS ACCEPTABLE FOR SPUTUM CULTURE Performed at [facility], [address], [place], [place] [address]    Report Status 08/05/2018 FINAL  Final  Culture, respiratory     Status: None   Collection Time: 08/05/18 12:46 PM  Result Value Ref Range Status   Specimen Description SPUTUM  Final   Special Requests NONE Reflexed from [id number]  Final   Gram Stain   Final    ABUNDANT WBC PRESENT, PREDOMINANTLY PMN RARE GRAM POSITIVE COCCI IN PAIRS RARE BUDDING YEAST SEEN RARE GRAM POSITIVE RODS    Culture   Final    MODERATE Consistent with normal respiratory flora. Performed at [facility], [place] [address] [place], [place] [address]    Report Status 08/07/2018 FINAL  Final         Radiology Studies: Dg Chest Port 1 View  Result Date: 08/09/2018 CLINICAL DATA:  Acute respiratory failure. EXAM: PORTABLE CHEST 1 VIEW COMPARISON:  One-view chest x-ray 08/08/2018 FINDINGS: The heart is enlarged. Aortic atherosclerosis is present. Pacing and defibrillator wires are stable. There is some previous aeration in the left lung diffuse asymmetric opacity. A left pleural effusion is again seen. The right lung  is clear. IMPRESSION: 1. Previous aeration of the left lung. 2. Persistent diffuse airspace disease consistent with resolving pneumonia and effusion. Electronically Signed   By: San Morelle M.D.   On: 08/09/2018 13:21        Scheduled Meds: . amiodarone  100 mg Oral q morning - 10a  . atorvastatin  20 mg Oral Daily  . calcium carbonate  1 tablet Oral Daily  . dorzolamide-timolol  1 drop Left Eye Daily  . enoxaparin (LOVENOX) injection  40 mg Subcutaneous Q24H  . feeding supplement (ENSURE ENLIVE)  237 mL Oral BID BM  . ferrous sulfate  325 mg Oral Q breakfast  . gabapentin  300 mg Oral TID  . guaiFENesin  1,200 mg Oral BID  . insulin aspart  0-9 Units Subcutaneous TID WC  . latanoprost  1 drop Left Eye QHS  . levothyroxine  137 mcg Oral Daily  . multivitamin with minerals  1 tablet Oral Daily  . tamsulosin  0.4 mg Oral BID   Continuous Infusions:    LOS: 9 days     [name], MD, FACP, [facility]. [facility] Pager [phone]  If 7PM-7AM, please contact night-coverage [email or web address] Password [email or web address] 08/10/2018, 1:58 PM

## 2018-08-10 NOTE — Progress Notes (Signed)
Patient will DC to: [facility] Anticipated DC date: 08/10/18 Family notified:[facility] by: Corey Harold  Per MD patient ready for DC to [facility]. RN, patient, patient's family, and facility notified of DC. Discharge Summary sent to facility. RN given number for report [phone]. DC packet on chart. Ambulance transport requested for patient.  CSW signing off.  [place], [place]

## 2018-08-10 NOTE — Clinical Social Work Placement (Signed)
   CLINICAL SOCIAL WORK PLACEMENT  NOTE  Date:  08/10/2018  Patient Details  Name: Gregory [name] MRN: [id number] Date of Birth: 13-Nov-1932  Clinical Social Work is seeking post-discharge placement for this patient at the [place] level of care (*CSW will initial, date and re-position this form in  chart as items are completed):  Yes   Patient/family provided with [facility] Work Department's list of facilities offering this level of care within the geographic area requested by the patient (or if unable, by the patient's family).  Yes   Patient/family informed of their freedom to choose among providers that offer the needed level of care, that participate in Medicare, Medicaid or managed care program needed by the patient, have an available bed and are willing to accept the patient.      Patient/family informed of [facility]'s ownership interest in [facility] and [facility], as well as of the fact that they are under no obligation to receive care at these facilities.  PASRR submitted to EDS on       PASRR number received on 08/04/18     Existing PASRR number confirmed on       FL2 transmitted to all facilities in geographic area requested by pt/family on 08/04/18     FL2 transmitted to all facilities within larger geographic area on       Patient informed that his/her managed care company has contracts with or will negotiate with certain facilities, including the following:        Yes   Patient/family informed of bed offers received.  Patient chooses bed at [facility]     Physician recommends and patient chooses bed at      Patient to be transferred to [facility] on 08/10/18.  Patient to be transferred to facility by [facility]     Patient family notified on 08/10/18 of transfer.  Name of family member notified:  [facility]     PHYSICIAN       Additional Comment:     _______________________________________________ Alberteen Sam, LCSW 08/10/2018, 4:08 PM

## 2018-08-10 NOTE — Plan of Care (Signed)
  Problem: Clinical Measurements: Goal: Will remain free from infection Outcome: Progressing   Problem: Clinical Measurements: Goal: Respiratory complications will improve Outcome: Progressing   Problem: Activity: Goal: Risk for activity intolerance will decrease Outcome: Progressing   Problem: Nutrition: Goal: Adequate nutrition will be maintained Outcome: Progressing   Problem: Elimination: Goal: Will not experience complications related to urinary retention Outcome: Progressing   Problem: Safety: Goal: Ability to remain free from injury will improve Outcome: Progressing   Problem: Skin Integrity: Goal: Risk for impaired skin integrity will decrease Outcome: Progressing   Problem: Activity: Goal: Ability to ambulate and perform ADLs will improve Outcome: Progressing   Problem: Clinical Measurements: Goal: Postoperative complications will be avoided or minimized Outcome: Progressing

## 2018-08-10 NOTE — Progress Notes (Signed)
Patients IV began to leak and was taken out at 3:40am. Per doctor, I was advised that another IV does not have to be inserted due to no IV medications and possible D/C.

## 2018-08-10 NOTE — Discharge Instructions (Signed)

## 2018-08-10 NOTE — Discharge Summary (Signed)
Physician Discharge Summary  Gregory [name] MLY:[id number] DOB: [birth year]  PCP: Gregory Sacramento, MD  Admit date: 07/31/2018 Discharge date: 08/10/2018  Recommendations for Outpatient Follow-up:  1. MD at SNF in 2 days with repeat labs (CBC & BMP). 2. Periodically follow chest x-ray to insure resolution of left-sided atelectasis.  Next chest x-ray in 2 to 3 days from discharge.  If abnormal findings persist, may consider outpatient Pulmonology consultation 3. Recommend outpatient Urology consultation for acute urinary retention/Foley dependent. 4. Dr. Lennette Bihari [name], Orthopedics in 2 weeks for postop follow-up. 5. Dr. Kathryne [name], PCP upon discharge from SNF.  Home Health: Patient being discharged to [facility]. Equipment/Devices: Deferred to SNF.  Discharge Condition: Improved and stable. CODE STATUS: Full. Diet recommendation: Heart healthy & diabetic diet.  Discharge Diagnoses:  Principal Problem:   Closed right hip fracture, initial encounter [facility]) Active Problems:   Implantable cardioverter-defibrillator (ICD) in situ   Hypothyroidism   Diabetes mellitus ([place])   Ischemic cardiomyopathy   Malnutrition of moderate degree   Brief Summary: Gregory [name] is a 84 y.o. M with chronic systolic CHF, EF 81%, ICD in place, Hypothyroidism, CKD III baseline Cr 1.5-2.0,  BPH, HTN, DM2, and macrocytic anemia who presented with mechanical fall, right hip pain.  Found in the ER to have nondisplaced RIGHT intertrochanteric fracture.  Status post IM nail 10/10. Hospitalization was complicated by left lung atelectasis due to mucous plugging and hypoxia.  Pulmonology consulted.    Improved.  Assessment & Plan:  Closed right hip fracture, S/p IM nail on 10/10 Patient underwent procedure after cardiology preop clearance.  As per orthopedics follow-up almost a week ago, recommend WBAT RLE, Lovenox for DVT prophylaxis.  Outpatient follow-up with orthopedics.  Has not required opioids in almost  2 days.  Pain controlled.  Left lung atelectasis complicated by acute hypoxic respiratory failure - Over the last weekend, patient developed progressive sluggishness, new leukocytosis, hypoxia.  Chest x-ray showed airspace disease in the left, initially thought to be effusion. - thoracentesis attempted but no significant tappable effusion was seen on ultrasound.   - Follow-up CT scan showed possible left mainstem bronchus occlusion with debris, multifocal pneumonia. - SLP note no overt aspiration, cannot rule out silent aspiration or previous one time event, although no event noted over weekend. - Hypoxia has since resolved. - Aspiration precautions - Pulmonary consultation and follow-up appreciated.  They suspect atelectasis and mucous plugging given body habitus, position and inability to clear secretions rather than pneumonia. -At SNF, continue aggressive pulmonary toilet, chest PT, mobilize, incentive spirometer, flutter valve and follow-up serial chest x-rays.    No bronchoscopy done. - Empirically started antibiotics were discontinued.  Sputum culture showed normal respiratory flora. I personally reviewed chest x-ray from 10/20 and left lung fields aeration continues to progressively improve.  As communicated with pulmonology, cleared for DC to SNF.  Pseudomonas CAUTI Pain with foley catheter on 10/14.  Foley replaced, new urine culture growing Pseudomonas.   -Completed 3 days of IV Zosyn.  Discontinued antibiotics.  Acute on chronic macrocytic anemia Baseline macrocytosis, unclear cause of baseline anemia. Now with superimposed acute blood loss anemia.   B12 normal.  No clinical bleeding.  Transfused 1 unit on 10/13, appropriate post-transfusion increase in Hgb.  Hemoglobin has gradually drifted down to 8.?  Critical illness. -Transfusion threshold 8 g/dL -Continue iron -Hemoglobin stable in the 8 g range for the last couple days.  Delirium Resolved.  Chronic systolic  CHF Hypertenion EF 25% in 2015.  No signs of hypervolemia. -Strict I/Os -Continue amiodarone, statin -At discharge, since blood pressures continue to remain soft and clinically does not appear volume overloaded, discontinued carvedilol, ramipril and furosemide.  Continue to monitor closely at SNF and consider resuming these medications as needed.  BPH Urinary retention Failed voiding trial over last weekend, Foley placed 10/12, cultured on 10/14 with Pseudomonas and now replaced.   -Continue Flomax - failed voiding trial again on 10/15.  Will need to discharge to SNF with Foley catheter and outpatient follow-up with urology in 2 weeks.  Diabetes type II -Was on metformin PTA.  Due to chronic kidney disease and creatinine clearance 34, discontinued metformin and continue SSI at SNF. -Continue gabapentin  Other medications -Continue eye drops -Unsure as to why he was taking valacyclovir PTA, resumed at discharge but need to check with his PCP if it needs to continue or be stopped.  Hypothyroidism -Continue levothyroxine  CKD IV  Creatinine stable in the 1.5 range.  Hard of hearing States that his hearing aid has run out of battery and needs replacement.  Right eye blindness/enucleated.     Consultants:   Orthopedics  Pulmonology  Cardiology  Procedures:   IM Nail 10/10  Foley catheter   Discharge Instructions  Discharge Instructions    (HEART FAILURE PATIENTS) Call MD:  Anytime you have any of the following symptoms: 1) 3 pound weight gain in 24 hours or 5 pounds in 1 week 2) shortness of breath, with or without a dry hacking cough 3) swelling in the hands, feet or stomach 4) if you have to sleep on extra pillows at night in order to breathe.   Complete by:  As directed    Call MD for:  difficulty breathing, headache or visual disturbances   Complete by:  As directed    Call MD for:  extreme fatigue   Complete by:  As directed    Call MD for:   persistant dizziness or light-headedness   Complete by:  As directed    Call MD for:  persistant nausea and vomiting   Complete by:  As directed    Call MD for:  redness, tenderness, or signs of infection (pain, swelling, redness, odor or green/yellow discharge around incision site)   Complete by:  As directed    Call MD for:  severe uncontrolled pain   Complete by:  As directed    Call MD for:  temperature >100.4   Complete by:  As directed    Diet - low sodium heart healthy   Complete by:  As directed    Diet Carb Modified   Complete by:  As directed    Discharge instructions   Complete by:  As directed    1) continue indwelling Foley catheter until outpatient urology consultation. 2) weightbearing as tolerated on right lower extremity.   Increase activity slowly   Complete by:  As directed        Medication List    STOP taking these medications   carvedilol 12.5 MG tablet Commonly known as:  COREG   diphenhydrAMINE 25 mg capsule Commonly known as:  BENADRYL   furosemide 40 MG tablet Commonly known as:  LASIX   metFORMIN 500 MG tablet Commonly known as:  GLUCOPHAGE   promethazine 25 MG tablet Commonly known as:  PHENERGAN   ramipril 1.25 MG capsule Commonly known as:  ALTACE   temazepam 30 MG capsule Commonly known as:  RESTORIL   timolol 0.25 % ophthalmic solution Commonly  known as:  TIMOPTIC     TAKE these medications   acetaminophen 500 MG tablet Commonly known as:  TYLENOL Take 500 mg by mouth every 6 (six) hours as needed. For pain   amiodarone 100 MG tablet Commonly known as:  PACERONE Take 1 tablet (100 mg total) by mouth every morning. What changed:  medication strength   aspirin EC 325 MG tablet Take 325 mg by mouth every morning.   atorvastatin 20 MG tablet Commonly known as:  LIPITOR Take 1 tablet (20 mg total) by mouth daily.   calcium carbonate 750 MG chewable tablet Commonly known as:  TUMS EX Chew 1 tablet by mouth daily.    CENTRUM SILVER PO Take 1 tablet by mouth daily.   cetirizine 10 MG tablet Commonly known as:  ZYRTEC Take 10 mg by mouth daily.   cholecalciferol 1000 units tablet Commonly known as:  VITAMIN D Take 1,000 Units by mouth daily.   dorzolamide-timolol 22.3-6.8 MG/ML ophthalmic solution Commonly known as:  COSOPT Place 1 drop into the left eye daily.   enoxaparin 40 MG/0.4ML injection Commonly known as:  LOVENOX Inject 0.4 mLs (40 mg total) into the skin daily. For 30 days from 08/01/2018. Start taking on:  08/11/2018   feeding supplement (ENSURE ENLIVE) Liqd Take 237 mLs by mouth 2 (two) times daily between meals. Start taking on:  08/11/2018   ferrous sulfate 325 (65 FE) MG tablet Take 325 mg by mouth daily with breakfast.   gabapentin 300 MG capsule Commonly known as:  NEURONTIN Take 300 mg by mouth 3 (three) times daily.   guaiFENesin 600 MG 12 hr tablet Commonly known as:  MUCINEX Take 2 tablets (1,200 mg total) by mouth 2 (two) times daily.   insulin aspart 100 UNIT/ML injection Commonly known as:  novoLOG Inject 0-9 Units into the skin 3 (three) times daily with meals. CBG < 70: implement hypoglycemia protocol CBG 70 - 120: 0 units CBG 121 - 150: 1 unit CBG 151 - 200: 2 units CBG 201 - 250: 3 units CBG 251 - 300: 5 units CBG 301 - 350: 7 units CBG 351 - 400: 9 units CBG > 400: call MD. Start taking on:  08/11/2018   latanoprost 0.005 % ophthalmic solution Commonly known as:  XALATAN Place 1 drop into the left eye at bedtime.   METAMUCIL CLEAR & NATURAL Powd Take 15 mLs by mouth daily as needed (fiber).   nitroGLYCERIN 0.4 MG SL tablet Commonly known as:  NITROSTAT Place 1 tablet (0.4 mg total) under the tongue every 5 (five) minutes as needed for chest pain.   SYNTHROID 137 MCG tablet Generic drug:  levothyroxine Take 137 mcg by mouth daily.   tamsulosin 0.4 MG Caps capsule Commonly known as:  FLOMAX Take 0.4 mg by mouth daily after breakfast.    trifluridine 1 % ophthalmic solution Commonly known as:  VIROPTIC Place 1 drop into the left eye every 2 (two) hours while awake.   valACYclovir 1000 MG tablet Commonly known as:  VALTREX Take 1,000 mg by mouth 2 (two) times daily.       Contact information for follow-up providers    MD at SNF. Schedule an appointment as soon as possible for a visit in 2 day(s).   Why:  To be seen with repeat labs (CBC & BMP).  Periodically follow chest x-ray to insure resolution of left-sided atelectasis.  Next chest x-ray in 2 to 3 days from discharge.  Recommend outpatient urology consultation for  urinary retention/Foley dependent.       Gregory Sacramento, MD. Schedule an appointment as soon as possible for a visit.   Specialty:  Family Medicine Why:  Upon discharge from SNF. Contact information: [address] [place] [address] [phone]        Shona Needles, MD. Schedule an appointment as soon as possible for a visit in 2 week(s).   Specialty:  Orthopedic Surgery Why:  For postop follow-up. Contact information: [place] [address] [phone]            Contact information for after-discharge care    Destination    [facility] .   Service:  Skilled Nursing Contact information: [place] [phone]                 No Known Allergies    Procedures/Studies: Ct Chest Wo Contrast  Result Date: 08/04/2018 CLINICAL DATA:  Follow-up pneumonia EXAM: CT CHEST WITHOUT CONTRAST TECHNIQUE: Multidetector CT imaging of the chest was performed following the standard protocol without IV contrast. COMPARISON:  08/04/2018, 07/31/2018 CT chest 06/08/2008 FINDINGS: Cardiovascular: Limited evaluation without intravenous contrast. Nonaneurysmal aorta. Moderate aortic atherosclerosis. Post CABG changes. Coronary vascular calcification. Mild cardiomegaly. No significant  pericardial effusion. Mediastinum/Nodes: Midline trachea. Esophagus within normal limits. Prominent mediastinal lymph nodes. Right paratracheal lymph node measures 13 mm. Multiple small subcentimeter prevascular and AP window lymph nodes, though increased as compared with 2009 chest CT. Distal esophageal lymph node measuring 11 mm. Lungs/Pleura: Volume loss with shift of mediastinal contents to the left. No significant pleural effusion. Peripheral pleural thickening at the left lung base, some of which was present on prior. Increased peripheral consolidation in the left upper lobe with pleural thickening present. Multifocal within the left lower lobe and left upper lobe. Abduct termination of left mainstem bronchus. There is expansion and filling of lingular and left lower lobe bronchi. Upper Abdomen: Surgical clips at the gallbladder fossa. No acute abnormality. Fat density in the region of right adrenal gland suggesting adrenal myelolipoma, this measures about 3.4 cm. Probable partially visualized cyst upper pole left kidney. Musculoskeletal: No acute or suspicious abnormality. Degenerative changes. Post sternotomy changes. IMPRESSION: 1. Mild mediastinal shift to the left consistent with volume loss. Multifocal consolidations within the left upper and lower lobes, suspect for pneumonia. Underlying or associated mass lesion is not excluded given the extensive consolidations present. There is abrupt occlusion of the left lower lobe and lingular bronchi by complex secretions, debris, or tissue. Additional finding of diffuse left pleural thickening, which could be secondary to infection or possible pleural mass. 2. Mediastinal adenopathy which may be reactive or potentially due to metastatic disease. 3. Cardiomegaly 4. Suspected 3.4 cm right adrenal myelolipoma Aortic Atherosclerosis (ICD10-I70.0). Electronically Signed   By: Donavan Foil M.D.   On: 08/04/2018 22:16   Dg Pelvis Portable  Result Date:  08/01/2018 CLINICAL DATA:  Right hip ORIF. EXAM: PORTABLE PELVIS 1-2 VIEWS COMPARISON:  Intraoperative radiographs of the same day. FINDINGS: Single AP view pelvis demonstrates a proximal right femoral IM rod with a dynamic hip screw. Right intratrochanteric fracture is reduced. Extensive vascular calcifications are present. IMPRESSION: ORIF of right intertrochanteric fracture without radiographic evidence for complication. Electronically Signed   By: San Morelle M.D.   On: 08/01/2018 14:22   Dg Chest Port 1 View  Result Date: 08/10/2018 CLINICAL DATA:  Acute respiratory failure. EXAM: PORTABLE CHEST 1 VIEW  COMPARISON:  Radiograph of August 09, 2018. FINDINGS: Stable cardiomegaly. Status post coronary artery bypass graft. Left-sided pacemaker is unchanged in position. No pneumothorax is noted. Right lung is clear. Atherosclerosis of thoracic aorta is noted. Stable diffuse left lung opacity is noted concerning for pneumonia with probable associated small pleural effusion. Bony thorax is unremarkable. IMPRESSION: Stable left lung opacity as described above. Aortic Atherosclerosis (ICD10-I70.0). Electronically Signed   By: Marijo Conception, M.D.   On: 08/10/2018 14:10   Dg Chest Port 1 View  Result Date: 08/09/2018 CLINICAL DATA:  Acute respiratory failure. EXAM: PORTABLE CHEST 1 VIEW COMPARISON:  One-view chest x-ray 08/08/2018 FINDINGS: The heart is enlarged. Aortic atherosclerosis is present. Pacing and defibrillator wires are stable. There is some previous aeration in the left lung diffuse asymmetric opacity. A left pleural effusion is again seen. The right lung is clear. IMPRESSION: 1. Previous aeration of the left lung. 2. Persistent diffuse airspace disease consistent with resolving pneumonia and effusion. Electronically Signed   By: San Morelle M.D.   On: 08/09/2018 13:21   Dg Chest Port 1 View  Result Date: 08/08/2018 CLINICAL DATA:  Left lung collapse EXAM: PORTABLE CHEST 1  VIEW COMPARISON:  08/07/2018 FINDINGS: Cardiac shadow remains enlarged. Defibrillator is again noted. There is been slight improved aeration in the left lung when compared with the prior exam although significant opacification of left hemithorax remains. The right lung is clear. No acute bony abnormality is noted. IMPRESSION: Slight improved aeration on the left when compared with the prior exam. Electronically Signed   By: Inez Catalina M.D.   On: 08/08/2018 08:08   Dg Chest Portable 1 View  Result Date: 08/07/2018 CLINICAL DATA:  Atelectasis EXAM: PORTABLE CHEST 1 VIEW COMPARISON:  08/06/2018 FINDINGS: Near complete opacification in the left hemithorax is stable. Left subclavian AICD device and leads are stable and intact. Right lung is clear. No pneumothorax. IMPRESSION: Stable near complete opacification in the left hemithorax which is likely due to a combination of consolidation and volume loss. Electronically Signed   By: Marybelle Killings M.D.   On: 08/07/2018 09:20   Dg Chest Port 1 View  Result Date: 08/06/2018 CLINICAL DATA:  Respiratory failure, CHF, history of previous MI former smoker. EXAM: PORTABLE CHEST 1 VIEW COMPARISON:  Chest x-ray of August 04, 2018 FINDINGS: There has been further opacification of the left hemithorax such that there is minimal if any aerated lung remaining. There is no mediastinal shift. The right lung is adequately inflated and clear. The pulmonary vascularity is not engorged. The ICD is in stable position. There are post CABG changes. There is dense calcification in the wall of the thoracic aorta. IMPRESSION: Opacification of the left hemithorax consistent with progressive atelectasis of the left lung. Possible pneumonia. Electronically Signed   By: David  Martinique M.D.   On: 08/06/2018 10:17   Dg Chest Port 1 View  Result Date: 08/04/2018 CLINICAL DATA:  Hypoxia, history of coronary artery disease and previous MI, CHF, former smoker. Recent right hip fracture status  post ORIF 3 days ago. EXAM: PORTABLE CHEST 1 VIEW COMPARISON:  Chest x-ray of July 31, 2018 FINDINGS: Since the previous study there has developed a large left pleural effusion. There has also developed increased parenchymal density in the left lung such that only a small amount of aerated lung is present and this lies in the apex. The right lung is well-expanded and clear. The heart borders are obscured. The ICD is in reasonable position. The sternal  wires from previous CABG are intact. There is calcification in the wall of the aortic arch. IMPRESSION: Interval development of a large left pleural effusion with associated atelectasis or possible pneumonia. No overt pulmonary edema given the clear right lung. Thoracic aortic atherosclerosis Electronically Signed   By: David  Martinique M.D.   On: 08/04/2018 13:09   Dg Chest Port 1 View  Result Date: 08/01/2018 CLINICAL DATA:  Preoperative for hip fracture. EXAM: PORTABLE CHEST 1 VIEW COMPARISON:  03/31/2015 FINDINGS: Postoperative changes in the mediastinum. Cardiac pacemaker. Shallow inspiration. Cardiac enlargement without vascular congestion. No edema or consolidation. There is chronic blunting of the left costophrenic angle probably due to pleural thickening. No pneumothorax. Calcification of the aorta. IMPRESSION: Cardiac enlargement. No evidence of active pulmonary disease. Chronic blunting of the left costophrenic angle probably due to pleural thickening. Electronically Signed   By: Lucienne Capers M.D.   On: 08/01/2018 00:20   Dg C-arm 1-60 Min  Result Date: 08/01/2018 CLINICAL DATA:  Hip fracture fixation EXAM: DG C-ARM 61-120 MIN; OPERATIVE RIGHT HIP WITH PELVIS COMPARISON:  And 1,019 FINDINGS: Intertrochanteric fracture has been fixed with a rod and screw. Fracture alignment satisfactory. Hardware positioning is satisfactory. IMPRESSION: Surgical fixation of intertrochanteric fracture. Electronically Signed   By: Franchot Gallo M.D.   On:  08/01/2018 14:04   Ir US Chest  Result Date: 08/04/2018 CLINICAL DATA:  Extensive left hemithorax opacification suggesting large effusion. Thoracentesis requested. EXAM: CHEST ULTRASOUND COMPARISON:  Radiography from earlier the same day FINDINGS: There is only a trace left pleural effusion. No safe pocket for thoracentesis. IMPRESSION: Trace left pleural effusion; thoracentesis deferred. Consolidation/atelectasis presumably accounts for much of the opacification of left hemithorax on radiography. CT may be useful for further characterization Electronically Signed   By: Lucrezia Europe M.D.   On: 08/04/2018 16:48   Dg Hip Operative Unilat W Or W/o Pelvis Right  Result Date: 08/01/2018 CLINICAL DATA:  Hip fracture fixation EXAM: DG C-ARM 61-120 MIN; OPERATIVE RIGHT HIP WITH PELVIS COMPARISON:  And 1,019 FINDINGS: Intertrochanteric fracture has been fixed with a rod and screw. Fracture alignment satisfactory. Hardware positioning is satisfactory. IMPRESSION: Surgical fixation of intertrochanteric fracture. Electronically Signed   By: Franchot Gallo M.D.   On: 08/01/2018 14:04   Dg Hip Unilat  With Pelvis 2-3 Views Right  Result Date: 07/31/2018 CLINICAL DATA:  Fall with right hip pain EXAM: DG HIP (WITH OR WITHOUT PELVIS) 2-3V RIGHT COMPARISON:  None. FINDINGS: Nondisplaced intertrochanteric fracture of the proximal right femur. Femoroacetabular joint is approximated. No pelvic fracture or diastasis. Normal left hip. Extensive vascular calcification. IMPRESSION: Nondisplaced intertrochanteric fracture of the right femur. Electronically Signed   By: Ulyses Jarred M.D.   On: 07/31/2018 23:02      Subjective: Reports that he had a coughing spell last night and brought up "globs" of light yellow phlegm.  No dyspnea.  Breathing at baseline.  Denies any other complaints.   Discharge Exam:  Vitals:   08/09/18 1652 08/09/18 1941 08/10/18 0730 08/10/18 0800  BP:  131/68 (!) 109/41 (!) 109/41  Pulse:   77 72   Resp:      Temp:  98.7 F (37.1 C)    TempSrc:  Oral    SpO2: 98% 94%    Weight:      Height:        General appearance: Pleasant elderly male, moderately built and nourished, lying comfortably propped up in bed without distress.  Has right hearing aid.  Appears in  good spirits. Eyes: The right eye is atrophied, this is congenital.  Has a patch over right eye. The left eye appears normal.   Cardiac: Heart rate regular, soft systolic murmur, JVP normal, no lower extremity edema.   Respiratory: Improved breath sounds left upper lung fields.  Decreased breath sounds in the left base.  Right lung fields clear to auscultation.  No wheezing, rhonchi or crackles appreciated.  No increased work of breathing. Abdomen: Abdomen soft without tenderness to palpation, ascites, distention, hepatospleno megaly.  Normal bowel sounds heard.   Neuro: Alert and oriented.  No focal neurological deficits.   Psych: Judgment and insight appear impaired.  Mood and affect appear appropriate. Extremities: Symmetric 5 x 5 power.  Right hip surgical site without acute findings. GU: Foley catheter +.    The results of significant diagnostics from this hospitalization (including imaging, microbiology, ancillary and laboratory) are listed below for reference.     Microbiology: Recent Results (from the past 240 hour(s))  Surgical pcr screen     Status: None   Collection Time: 08/01/18  2:55 AM  Result Value Ref Range Status   MRSA, PCR NEGATIVE NEGATIVE Final   Staphylococcus aureus NEGATIVE NEGATIVE Final    Comment: (NOTE) The Xpert SA Assay (FDA approved for NASAL specimens in patients 51 years of age and older), is one component of a comprehensive surveillance program. It is not intended to diagnose infection nor to guide or monitor treatment. Performed at [facility], [place] [address] [place], [place] [address]   Culture, blood (routine x 2)     Status: None   Collection Time: 08/04/18  12:02 PM  Result Value Ref Range Status   Specimen Description BLOOD SITE NOT SPECIFIED  Final   Special Requests   Final    BOTTLES DRAWN AEROBIC AND ANAEROBIC Blood Culture adequate volume   Culture   Final    NO GROWTH 5 DAYS Performed at [facility], [address] [place], [place] [address]    Report Status 08/09/2018 FINAL  Final  Culture, blood (routine x 2)     Status: None   Collection Time: 08/04/18 12:02 PM  Result Value Ref Range Status   Specimen Description BLOOD RIGHT HAND  Final   Special Requests   Final    BOTTLES DRAWN AEROBIC AND ANAEROBIC Blood Culture adequate volume   Culture   Final    NO GROWTH 5 DAYS Performed at [facility], [place] [address], [place], [place] [address]    Report Status 08/09/2018 FINAL  Final  Culture, Urine     Status: Abnormal   Collection Time: 08/04/18 12:38 PM  Result Value Ref Range Status   Specimen Description URINE, CATHETERIZED  Final   Special Requests   Final    NONE Performed at [facility], [place] [address], [place], [place] [address]    Culture >=100,000 COLONIES/mL PSEUDOMONAS AERUGINOSA (A)  Final   Report Status 08/06/2018 FINAL  Final   Organism ID, Bacteria PSEUDOMONAS AERUGINOSA (A)  Final      Susceptibility   Pseudomonas aeruginosa - MIC*    CEFTAZIDIME 4 SENSITIVE Sensitive     CIPROFLOXACIN <=0.25 SENSITIVE Sensitive     GENTAMICIN 2 SENSITIVE Sensitive     IMIPENEM 2 SENSITIVE Sensitive     PIP/TAZO 32 SENSITIVE Sensitive     CEFEPIME 2 SENSITIVE Sensitive     * >=100,000 COLONIES/mL PSEUDOMONAS AERUGINOSA  Expectorated sputum assessment w rflx to  resp cult     Status: None   Collection Time: 08/05/18 12:46 PM  Result Value Ref Range Status   Specimen Description SPUTUM  Final   Special Requests NONE  Final   Sputum evaluation   Final    THIS SPECIMEN IS ACCEPTABLE FOR SPUTUM CULTURE Performed at [facility], [place] [address], [place], [place] [address]    Report Status  08/05/2018 FINAL  Final  Culture, respiratory     Status: None   Collection Time: 08/05/18 12:46 PM  Result Value Ref Range Status   Specimen Description SPUTUM  Final   Special Requests NONE Reflexed from [phone]  Final   Gram Stain   Final    ABUNDANT WBC PRESENT, PREDOMINANTLY PMN RARE GRAM POSITIVE COCCI IN PAIRS RARE BUDDING YEAST SEEN RARE GRAM POSITIVE RODS    Culture   Final    MODERATE Consistent with normal respiratory flora. Performed at [facility], [place] [address], [place], [name] [address]    Report Status 08/07/2018 FINAL  Final     Labs: CBC: Recent Labs  Lab 08/04/18 0352 08/05/18 0206 08/06/18 0136 08/07/18 0159 08/08/18 0231  WBC 13.4* 11.5* 12.1* 10.6* 10.6*  HGB 9.6* 9.4* 8.5* 8.0* 8.3*  HCT 30.2* 29.2* 26.3* 25.5* 26.0*  MCV 98.4 99.0 98.9 98.5 99.2  PLT 152 164 174 191 335   Basic Metabolic Panel: Recent Labs  Lab 08/04/18 0352 08/05/18 0206 08/06/18 0136 08/07/18 0159 08/08/18 0231  NA 135 137 132* 130* 134*  K 4.9 5.3* 4.1 4.0 3.8  CL 99 98 94* 95* 97*  CO2 29 30 28 27 26   GLUCOSE 174* 132* 134* 167* 147*  BUN 35* 37* 41* 42* 42*  CREATININE 1.73* 1.70* 1.69* 1.55* 1.51*  CALCIUM 8.5* 8.9 8.5* 8.0* 8.2*   Liver Function Tests: Recent Labs  Lab 08/05/18 0206  AST 28  ALT 9  ALKPHOS 83  BILITOT 0.8  PROT 6.3*  ALBUMIN 2.4*   CBG: Recent Labs  Lab 08/09/18 1153 08/09/18 2128 08/10/18 0646 08/10/18 1142 08/10/18 1607  GLUCAP 208* 143* 135* 187* 131*   Urinalysis    Component Value Date/Time   COLORURINE YELLOW 08/04/2018 1246   APPEARANCEUR CLEAR 08/04/2018 1246   LABSPEC 1.016 08/04/2018 1246   PHURINE 5.0 08/04/2018 1246   GLUCOSEU NEGATIVE 08/04/2018 1246   HGBUR MODERATE (A) 08/04/2018 1246   BILIRUBINUR NEGATIVE 08/04/2018 [place] 08/04/2018 1246   PROTEINUR NEGATIVE 08/04/2018 1246   NITRITE NEGATIVE 08/04/2018 1246   LEUKOCYTESUR TRACE (A) 08/04/2018 1246      Time coordinating  discharge: 60 minutes  SIGNED:  [name], MD, FACP, [facility]. [facility] Pager [phone]  If 7PM-7AM, please contact night-coverage [email or web address] Password [facility] 08/10/2018, 4:37 PM

## 2018-08-10 NOTE — Progress Notes (Signed)
NAME:  Gregory [name], MRN:  [id number], DOB:  Feb 21, 1933, LOS: 9 ADMISSION DATE:  07/31/2018, CONSULTATION DATE:  08/05/2018 REFERRING MD:  Loleta Books, CHIEF COMPLAINT:  Interval development of  Multifocal consolidations within the left upper and lower lobes,and abrupt occlusion of LLL, post ORIF right hip on 07/31/2018.  Brief History   Gregory [name] is a 84 y.o. M former smoker of pipe and cigars ( Quit 2006) with sCHF, EF 25%, ICD in place, Hypothyroidism, CKD III baseline Cr 1.5-2.0,  BPH, HTN, DM, and macrocytic anemia who presented with mechanical fall, right hip pain.  Found in the ER to have nondisplaced RIGHT intertrochanteric fracture.Pt had Closed right hip fracture s/p IM nail on 10/10. Chest X ray 10/14 showed Interval development of a large left pleural effusion with associated atelectasis or possible pneumonia, clear right lung. CT Chest done 10/14 showed mild mediastinal shift to the left consistent with volume loss. Multifocal consolidations within the left upper and lower lobes, suspect for pneumonia, with concern for underlying mass lesions due to extensive consolidations and  abrupt occlusion of the left lower lobe and lingular bronchi by complex secretions, debris, or tissue.  PCCM have been consulted to assist with management of this new pulmonary finding.  Past Medical History  Former smoker (Quit 2006) with sCHF, EF 25%, ICD in place, Hypothyroidism, CKD III baseline Cr 1.5-2.0,  BPH, HTN, DM,  macrocytic anemia , and recent hip fracture post fall with surgical repair. Significant Hospital Events   07/31/2018>> Admission with Fx hip 10/15 > L Sided collapse on CXR  Consults: date of consult/date signed off & final recs:  10/15>> Pulmonary  Procedures (surgical and bedside):    Significant Diagnostic Tests:  08/04/2018>>CT Chest w/o contrast Mild mediastinal shift to the left consistent with volume loss. Multifocal consolidations within the left upper and lower lobes,  suspect for pneumonia. Underlying or associated mass lesion is not excluded given the extensive consolidations present. There is abrupt occlusion of the left lower lobe and lingular bronchi by complex secretions, debris, or tissue. Additional finding of diffuse left pleural thickening, which could be secondary to infection or possible pleural mass. Mediastinal adenopathy which may be reactive or potentially due to metastatic disease. Cardiomegaly Suspected 3.4 cm right adrenal myelolipoma  Chest x-ray 08/09/2018-improving aeration in the left.  There is persistent basal effusion.  I have reviewed the images personally.  Micro Data:  08/04/2018>> Blood 08/04/2018>> Urine:  Pseudomonas 08/05/2018>> Sputum: normal flora Antimicrobials:  Zosyn 08/05/2018>> Azithromycin 10/14>> 10/15 Rocephin 10/14-10/15  Subjective:  Continues to improve.  Cough is nonproductive no No dyspnea, fevers, chills.  Objective   Blood pressure (!) 109/41, pulse 72, temperature 98.7 F (37.1 C), temperature source Oral, resp. rate 18, height 5\' 6"  (1.676 m), weight 70.8 kg, SpO2 94 %.        Intake/Output Summary (Last 24 hours) at 08/10/2018 1520 Last data filed at 08/10/2018 0610 Gross per 24 hour  Intake 240 ml  Output 800 ml  Net -560 ml   Filed Weights   07/31/18 2201  Weight: 70.8 kg    Examination: Gen:      No acute distress HEENT:  EOMI, sclera anicteric Neck:     No masses; no thyromegaly Lungs:    Scattered bilateral rhonchi. CV:         Regular rate and rhythm; no murmurs Abd:      + bowel sounds; soft, non-tender; no palpable masses, no distension Ext:    No edema; adequate  peripheral perfusion Skin:      Warm and dry; no rash Neuro: alert and oriented x 3 Psych: normal mood and affect  Resolved Hospital Problem list     Assessment & Plan:  84 year old severely physically debilitated man presenting with atelectasis and infiltrate of the left lung, favored is atelectasis and mucous  plugging given body habitus, position and inability to clear secretions.  Seems to have developed rather quickly to represent mass.   Chest x-ray slowly improving but not lung is not completely opened up yet  Lt lung collapse Continue pulmonary toilet, chest PT, chest vest Mucomyst, saline nebs Chest x-ray shows continued improvement Defer bronchoscopy Okay from our viewpoint to transfer to SNF when bed is available Follow intermittent chest x-ray. Discussed with Dr. Algis Liming.  Hypoxemia: improved. Now on RA with sats 94% Titrate O2 for sat of 88-95%  Deconditioning: Nutrition consult and PT as able  PCCM will be available as needed.  Please call with any questions. Disposition / Summary of Today's Plan 08/10/18   Continues to improve Continue chest PT, pulmonary toilet, nebs    Diet: Regular - soft Pain/Anxiety/Delirium protocol (if indicated): Hydrocodone DVT prophylaxis: Lovenox GI prophylaxis:  Hyperglycemia protocol: SSI Mobility: OOB to chair/ PT/OT Code Status: Full Family Communication: Patient updated 10/19  Labs   CBC: Recent Labs  Lab 08/04/18 0352 08/05/18 0206 08/06/18 0136 08/07/18 0159 08/08/18 0231  WBC 13.4* 11.5* 12.1* 10.6* 10.6*  HGB 9.6* 9.4* 8.5* 8.0* 8.3*  HCT 30.2* 29.2* 26.3* 25.5* 26.0*  MCV 98.4 99.0 98.9 98.5 99.2  PLT 152 164 174 191 030    Basic Metabolic Panel: Recent Labs  Lab 08/04/18 0352 08/05/18 0206 08/06/18 0136 08/07/18 0159 08/08/18 0231  NA 135 137 132* 130* 134*  K 4.9 5.3* 4.1 4.0 3.8  CL 99 98 94* 95* 97*  CO2 29 30 28 27 26   GLUCOSE 174* 132* 134* 167* 147*  BUN 35* 37* 41* 42* 42*  CREATININE 1.73* 1.70* 1.69* 1.55* 1.51*  CALCIUM 8.5* 8.9 8.5* 8.0* 8.2*   GFR: Estimated Creatinine Clearance: 34 mL/min (A) (by C-G formula based on SCr of 1.51 mg/dL (H)). Recent Labs  Lab 08/04/18 1144 08/05/18 0206 08/06/18 0136 08/07/18 0159 08/08/18 0231  PROCALCITON  --   --  0.25 0.28  --   WBC  --  11.5*  12.1* 10.6* 10.6*  LATICACIDVEN 1.0  --   --   --   --     Liver Function Tests: Recent Labs  Lab 08/05/18 0206  AST 28  ALT 9  ALKPHOS 83  BILITOT 0.8  PROT 6.3*  ALBUMIN 2.4*   No results for input(s): LIPASE, AMYLASE in the last 168 hours. No results for input(s): AMMONIA in the last 168 hours.  ABG    Component Value Date/Time   HCO3 26.5 (H) 11/25/2007 1222   TCO2 28 11/25/2007 1222     Coagulation Profile: No results for input(s): INR, PROTIME in the last 168 hours.  Cardiac Enzymes: No results for input(s): CKTOTAL, CKMB, CKMBINDEX, TROPONINI in the last 168 hours.  HbA1C: Hgb A1c MFr Bld  Date/Time Value Ref Range Status  12/31/2011 12:20 PM 9.3 (H) 4.6 - 6.5 % Final    Comment:    Glycemic Control Guidelines for People with Diabetes:Non Diabetic:  <6%Goal of Therapy: <7%Additional Action Suggested:  >8%     CBG: Recent Labs  Lab 08/03/18 1646 08/09/18 1153 08/09/18 2128 08/10/18 0646 08/10/18 1142  GLUCAP 175*  [place] MD [facility] 08/10/2018, 3:20 PM

## 2018-08-11 ENCOUNTER — Encounter (HOSPITAL_COMMUNITY): Payer: PPO

## 2018-08-11 DIAGNOSIS — J9601 Acute respiratory failure with hypoxia: Secondary | ICD-10-CM | POA: Diagnosis not present

## 2018-08-11 DIAGNOSIS — I5022 Chronic systolic (congestive) heart failure: Secondary | ICD-10-CM | POA: Diagnosis not present

## 2018-08-11 DIAGNOSIS — S72146D Nondisplaced intertrochanteric fracture of unspecified femur, subsequent encounter for closed fracture with routine healing: Secondary | ICD-10-CM | POA: Diagnosis not present

## 2018-08-11 DIAGNOSIS — N390 Urinary tract infection, site not specified: Secondary | ICD-10-CM | POA: Diagnosis not present

## 2018-08-11 SURGERY — BRONCHOSCOPY, WITH FLUOROSCOPY
Anesthesia: Moderate Sedation | Laterality: Bilateral

## 2018-08-13 ENCOUNTER — Other Ambulatory Visit: Payer: Self-pay | Admitting: *Deleted

## 2018-08-13 DIAGNOSIS — L89610 Pressure ulcer of right heel, unstageable: Secondary | ICD-10-CM | POA: Diagnosis not present

## 2018-08-13 DIAGNOSIS — L89312 Pressure ulcer of right buttock, stage 2: Secondary | ICD-10-CM | POA: Diagnosis not present

## 2018-08-13 DIAGNOSIS — S72001A Fracture of unspecified part of neck of right femur, initial encounter for closed fracture: Secondary | ICD-10-CM | POA: Diagnosis not present

## 2018-08-13 DIAGNOSIS — N183 Chronic kidney disease, stage 3 (moderate): Secondary | ICD-10-CM | POA: Diagnosis not present

## 2018-08-13 DIAGNOSIS — I509 Heart failure, unspecified: Secondary | ICD-10-CM | POA: Diagnosis not present

## 2018-08-13 DIAGNOSIS — R339 Retention of urine, unspecified: Secondary | ICD-10-CM | POA: Diagnosis not present

## 2018-08-13 DIAGNOSIS — E039 Hypothyroidism, unspecified: Secondary | ICD-10-CM | POA: Diagnosis not present

## 2018-08-13 DIAGNOSIS — E119 Type 2 diabetes mellitus without complications: Secondary | ICD-10-CM | POA: Diagnosis not present

## 2018-08-13 DIAGNOSIS — N390 Urinary tract infection, site not specified: Secondary | ICD-10-CM | POA: Diagnosis not present

## 2018-08-13 NOTE — Patient Outreach (Signed)
[facility]) Care Management  08/13/2018  Gregory [name] [birth year] [id number]   Onsite IDT meeting.  Facility staff report that patient family has already started a LTC Medicaid application and that the intent is for patient to transition to LTC.  No [facility]N CM needs assessed at this time. Will sign off, if patient discharge plan changes, patient can be reassessed.  [name]. [name], RN, BSN, [name] [phone]) Business Cell  [phone]) Toll Free Office

## 2018-08-15 NOTE — Progress Notes (Signed)
Cardiology Office Note   Date:  08/20/2018   ID:  AZAM GERVASI, DOB Jul 11, 1933, MRN [id number]  PCP:  Christain Sacramento, MD  Cardiologist: Darlin Coco MD  No chief complaint on file.     History of Present Illness: Gregory [name] is a 84 y.o. male who presents for a six-month follow-up visit.  This pleasant 84 y.o.  gentleman last seen by me in 2017 and more recently by Dr Lovena Le for his AICD   Previously seen by Dr Mare Ferrari  Previous anterior MI with CABG 2006   EF by TTE 2015 25-30% mild MR Has been on low dose amiodarone due to VT  DM followed by Dr Michiel Sites D/C from hospital 08/10/18 after mechanical fall with right intertrochanteric fracture with surgery 07/31/18 Had some pulmonary issues With hypoxia and mucous plugging BP was soft and coreg, rmaipril and lasix were held on d/c to SNF  CKD with baseline Cr 1.5  He does have trifasicular block but not noted to V pace often His Med[employer]onic single lead device will provide back up pacing at 40 bpm   Still in wheel chair has foley Unable to walk with walker yet. Wife is at [employer] as well   Past Medical History:  Diagnosis Date  . Abnormality of gait 09/19/2016  . Allergy   . Cataract   . CHF (congestive heart failure) ([place])   . Colon polyps   . Diabetes mellitus   . Diabetic neuropathy ([place])   . Diverticulosis   . Dyslipidemia   . Essential hypertension   . Glaucoma   . Hyperlipidemia   . Hypothyroidism   . ICD (implantable cardiac defibrillator) in place 11-2004  . Internal hemorrhoid   . Ischemic cardiomyopathy   . Ischemic heart disease   . Myocardial infarction [facility] 2006   LARGE ANTERIOR WALL    Past Surgical History:  Procedure Laterality Date  . CARDIAC DEFIBRILLATOR PLACEMENT    . CATARACT EXTRACTION     left  . CHOLECYSTECTOMY    . CHOLECYSTECTOMY, LAPAROSCOPIC    . COLONOSCOPY N/A 12/01/2012   Procedure: COLONOSCOPY;  Surgeon: Irene Shipper, MD;  Location: [facility] ENDOSCOPY;  Service:  Endoscopy;  Laterality: N/A;  . CORONARY ARTERY BYPASS GRAFT  12/02/04  . ESOPHAGOGASTRODUODENOSCOPY N/A 12/01/2012   Procedure: ESOPHAGOGASTRODUODENOSCOPY (EGD);  Surgeon: Irene Shipper, MD;  Location: [facility];  Service: Endoscopy;  Laterality: N/A;  . 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 IMPLANTABLE CARDIOVERTER DEFIBRILLATOR (ICD) GENERATOR CHANGE N/A 03/11/2014   Procedure: ICD GENERATOR CHANGE;  Surgeon: Evans Lance, MD;  Location[facility] C[facility] LAB;  Service: Cardiovascular;  Laterality: N/A;  . INTRAMEDULLARY (IM) NAIL INTERTROCHANTERIC Right 08/01/2018   Procedure: INTRAMEDULLARY (IM) NAIL INTERTROCHANTRIC right hip;  Surgeon: Shona Needles, MD;  Location: [facility];  Service: Orthopedics;  Laterality: Right;  . SHOULDER SURGERY     RIGHT  . TONSILLECTOMY       Current Outpatient Medications  Medication Sig Dispense Refill  . acetaminophen (TYLENOL) 500 MG tablet Take 500 mg by mouth every 6 (six) hours as needed. For pain    . amiodarone (PACERONE) 100 MG tablet Take 1 tablet (100 mg total) by mouth every morning.    Marland Kitchen aspirin EC 325 MG tablet Take 325 mg by mouth every morning.    Marland Kitchen atorvastatin (LIPITOR) 20 MG tablet Take 1 tablet (20 mg total) by mouth daily. 90 tablet 3  . calcium carbonate (  TUMS EX) 750 MG chewable tablet Chew 1 tablet by mouth daily.     . cetirizine (ZYRTEC) 10 MG tablet Take 10 mg by mouth daily.    . cholecalciferol (VITAMIN D) 1000 UNITS tablet Take 1,000 Units by mouth daily.    . dorzolamide-timolol (COSOPT) 22.3-6.8 MG/ML ophthalmic solution Place 1 drop into the left eye daily.    Marland Kitchen enoxaparin (LOVENOX) 40 MG/0.4ML injection Inject 0.4 mLs (40 mg total) into the skin daily. For 30 days from 08/01/2018. 0 Syringe   . feeding supplement, ENSURE ENLIVE, (ENSURE ENLIVE) LIQD Take 237 mLs by mouth 2 (two) times daily between meals.    . ferrous sulfate 325 (65 FE) MG tablet Take 325 mg by mouth daily with breakfast.    . gabapentin  (NEURONTIN) 300 MG capsule Take 300 mg by mouth 3 (three) times daily.     Marland Kitchen guaiFENesin (MUCINEX) 600 MG 12 hr tablet Take 2 tablets (1,200 mg total) by mouth 2 (two) times daily.    . insulin aspart (NOVOLOG) 100 UNIT/ML injection Inject 0-9 Units into the skin 3 (three) times daily with meals. CBG < 70: implement hypoglycemia protocol CBG 70 - 120: 0 units CBG 121 - 150: 1 unit CBG 151 - 200: 2 units CBG 201 - 250: 3 units CBG 251 - 300: 5 units CBG 301 - 350: 7 units CBG 351 - 400: 9 units CBG > 400: call MD.    . Inulin (METAMUCIL CLEAR & NATURAL) POWD Take 15 mLs by mouth daily as needed (fiber).     . latanoprost (XALATAN) 0.005 % ophthalmic solution Place 1 drop into the left eye at bedtime.    . Multiple Vitamins-Minerals (CENTRUM SILVER PO) Take 1 tablet by mouth daily.     . nitroGLYCERIN (NITROSTAT) 0.4 MG SL tablet Place 1 tablet (0.4 mg total) under the tongue every 5 (five) minutes as needed for chest pain. 25 tablet 2  . SYNTHROID 137 MCG tablet Take 137 mcg by mouth daily.  0  . tamsulosin (FLOMAX) 0.4 MG CAPS capsule Take 0.4 mg by mouth daily after breakfast.      No current facility-administered medications for this visit.     Allergies:   Patient has no known allergies.    Social History:  The patient  reports that he quit smoking about 13 years ago. His smoking use included cigarettes. He has never used smokeless tobacco. He reports that he does not drink alcohol or use drugs.   Family History:  The patient's family history includes Cancer in his daughter; Diabetes in his mother and unknown relative; Prostate cancer in his father.    ROS:  Please see the history of present illness.   Otherwise, review of systems are positive for none.   All other systems are reviewed and negative.    PHYSICAL EXAM: VS:  BP 118/70   Pulse 63   Ht 5\' 6"  (1.676 m)   SpO2 99%   BMI 25.18 kg/m  , BMI Body mass index is 25.18 kg/m. Affect appropriate Chronically ill white male   HEENT: right eye enucleated  Neck supple with no adenopathy JVP normal no bruits no thyromegaly Lungs clear with no wheezing and good diaphragmatic motion Heart:  S1/S2 no murmur, no rub, gallop or click PMI enlarged AICD under left clavicle  Abdomen: benighn, BS positve, no tenderness, no AAA no bruit.  No HSM or HJR Foley catheter  Distal pulses intact with no bruits No edema Neuro non-focal Skin  warm and dry Recent right hip fracture     EKG:  Sinus very long PR RBBB 08/01/18 08/20/18 V pacing RBBB pattern    Recent Labs: 08/05/2018: ALT 9 08/08/2018: BUN 42; Creatinine, Ser 1.51; Hemoglobin 8.3; Platelets 203; Potassium 3.8; Sodium 134    Lipid Panel    Component Value Date/Time   CHOL 170 07/23/2016 1207   TRIG 260 (H) 07/23/2016 1207   HDL 30 (L) 07/23/2016 1207   CHOLHDL 5.7 (H) 07/23/2016 1207   VLDL 52 (H) 07/23/2016 1207   LDLCALC 88 07/23/2016 1207   LDLDIRECT 57.2 12/31/2011 1220      Wt Readings from Last 3 Encounters:  07/31/18 156 lb (70.8 kg)  06/14/17 168 lb 9.6 oz (76.5 kg)  01/28/17 180 lb (81.6 kg)         ASSESSMENT AND PLAN:   1. CAD:  CABG 2006 no angina continue medical Rx 2. DCM:  Update euvolemic no symptoms no need for updated TTE given age and co morbidities  3. AICD:  F/u with Dr Lovena Le on low dose amiodarone seeing NP today needs to move monitoring Equipment to rehab  4. Bradycardia:  With tri fasicular block AICD provides back up VVI pacing rate 40 bpm  5. HTN:  Well controlled.  Continue current medications and low sodium Dash type diet.   6. Thyroid: on synthroid followed by primary . 7.  Diabetes mellitus followed by Dr Michiel Sites discussed low carb diet     Disposition: F/U Lovena Le 6 months and me in a year    Jenkins Rouge

## 2018-08-19 DIAGNOSIS — I10 Essential (primary) hypertension: Secondary | ICD-10-CM | POA: Diagnosis not present

## 2018-08-19 DIAGNOSIS — N390 Urinary tract infection, site not specified: Secondary | ICD-10-CM | POA: Diagnosis not present

## 2018-08-19 DIAGNOSIS — S72146D Nondisplaced intertrochanteric fracture of unspecified femur, subsequent encounter for closed fracture with routine healing: Secondary | ICD-10-CM | POA: Diagnosis not present

## 2018-08-19 DIAGNOSIS — I5022 Chronic systolic (congestive) heart failure: Secondary | ICD-10-CM | POA: Diagnosis not present

## 2018-08-20 ENCOUNTER — Encounter: Payer: Self-pay | Admitting: Nurse Practitioner

## 2018-08-20 ENCOUNTER — Encounter: Payer: Self-pay | Admitting: Cardiovascular Disease

## 2018-08-20 ENCOUNTER — Ambulatory Visit (INDEPENDENT_AMBULATORY_CARE_PROVIDER_SITE_OTHER): Payer: PPO | Admitting: Nurse Practitioner

## 2018-08-20 ENCOUNTER — Ambulatory Visit: Payer: PPO | Admitting: Cardiovascular Disease

## 2018-08-20 VITALS — BP 118/70 | HR 63 | Ht 66.0 in | Wt 156.0 lb

## 2018-08-20 VITALS — BP 118/70 | HR 63 | Ht 66.0 in

## 2018-08-20 DIAGNOSIS — I10 Essential (primary) hypertension: Secondary | ICD-10-CM

## 2018-08-20 DIAGNOSIS — I25810 Atherosclerosis of coronary artery bypass graft(s) without angina pectoris: Secondary | ICD-10-CM | POA: Diagnosis not present

## 2018-08-20 DIAGNOSIS — L89610 Pressure ulcer of right heel, unstageable: Secondary | ICD-10-CM | POA: Diagnosis not present

## 2018-08-20 DIAGNOSIS — I255 Ischemic cardiomyopathy: Secondary | ICD-10-CM

## 2018-08-20 DIAGNOSIS — L89312 Pressure ulcer of right buttock, stage 2: Secondary | ICD-10-CM | POA: Diagnosis not present

## 2018-08-20 DIAGNOSIS — I5022 Chronic systolic (congestive) heart failure: Secondary | ICD-10-CM | POA: Diagnosis not present

## 2018-08-20 LAB — CUP PACEART INCLINIC DEVICE CHECK
Date Time Interrogation Session: 20191030113923
Implantable Lead Location: 753860
Implantable Pulse Generator Implant Date: 20150521
MDC IDC LEAD IMPLANT DT: 20090204

## 2018-08-20 NOTE — Patient Instructions (Signed)

## 2018-08-20 NOTE — Progress Notes (Signed)
Electrophysiology Office Note Date: 08/20/2018  ID:  Gregory [name], DOB 1933/03/11, MRN [id number]  PCP: Christain Sacramento, MD Electrophysiologist: Lovena Le  CC: Routine ICD follow-up  Gregory [name] is a 84 y.o. male seen today for Dr Lovena Le.  He presents today for routine electrophysiology followup.  Since last being seen in our clinic, the patient reports doing relatively well.  He remains at [facility] following a fall and hip fracture. He and his wife are there together and there are no plans for them to return home.  He is working with PT daily and making some progress.  He denies chest pain, palpitations, dyspnea, PND, orthopnea, nausea, vomiting, dizziness, syncope, edema, weight gain, or early satiety.  He has not had ICD shocks.   Device History: MDT single chamber ICD implanted 2006 for VT, ICM; new RV lead 2009; gen change 2015 History of appropriate therapy: Yes History of AAD therapy: Yes - amiodarone   Past Medical History:  Diagnosis Date  . Abnormality of gait 09/19/2016  . Allergy   . Cataract   . CHF (congestive heart failure) ([place])   . Colon polyps   . Diabetes mellitus   . Diabetic neuropathy ([place])   . Diverticulosis   . Dyslipidemia   . Essential hypertension   . Glaucoma   . Hyperlipidemia   . Hypothyroidism   . ICD (implantable cardiac defibrillator) in place 11-2004  . Internal hemorrhoid   . Ischemic cardiomyopathy   . Ischemic heart disease   . Myocardial infarction [facility]) 2006   LARGE ANTERIOR WALL   Past Surgical History:  Procedure Laterality Date  . CARDIAC DEFIBRILLATOR PLACEMENT    . CATARACT EXTRACTION     left  . CHOLECYSTECTOMY    . CHOLECYSTECTOMY, LAPAROSCOPIC    . COLONOSCOPY N/A 12/01/2012   Procedure: COLONOSCOPY;  Surgeon: Irene Shipper, MD;  Location: [facility] ENDOSCOPY;  Service: Endoscopy;  Laterality: N/A;  . CORONARY ARTERY BYPASS GRAFT  12/02/04  . ESOPHAGOGASTRODUODENOSCOPY N/A 12/01/2012   Procedure:  ESOPHAGOGASTRODUODENOSCOPY (EGD);  Surgeon: Irene Shipper, MD;  Location: [facility];  Service: Endoscopy;  Laterality: N/A;  . 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 IMPLANTABLE CARDIOVERTER DEFIBRILLATOR (ICD) GENERATOR CHANGE N/A 03/11/2014   Procedure: ICD GENERATOR CHANGE;  Surgeon: Evans Lance, MD;  Location: [facility] CATH LAB;  Service: Cardiovascular;  Laterality: N/A;  . INTRAMEDULLARY (IM) NAIL INTERTROCHANTERIC Right 08/01/2018   Procedure: INTRAMEDULLARY (IM) NAIL INTERTROCHANTRIC right hip;  Surgeon: Shona Needles, MD;  Location: [place];  Service: Orthopedics;  Laterality: Right;  . SHOULDER SURGERY     RIGHT  . TONSILLECTOMY      Current Outpatient Medications  Medication Sig Dispense Refill  . acetaminophen (TYLENOL) 500 MG tablet Take 500 mg by mouth every 6 (six) hours as needed. For pain    . amiodarone (PACERONE) 100 MG tablet Take 1 tablet (100 mg total) by mouth every morning.    Marland Kitchen aspirin EC 325 MG tablet Take 325 mg by mouth every morning.    Marland Kitchen atorvastatin (LIPITOR) 20 MG tablet Take 1 tablet (20 mg total) by mouth daily. 90 tablet 3  . calcium carbonate (TUMS EX) 750 MG chewable tablet Chew 1 tablet by mouth daily.     . cetirizine (ZYRTEC) 10 MG tablet Take 10 mg by mouth daily.    . cholecalciferol (VITAMIN D) 1000 UNITS tablet Take 1,000 Units by mouth daily.    . dorzolamide-timolol (COSOPT)  22.3-6.8 MG/ML ophthalmic solution Place 1 drop into the left eye daily.    Marland Kitchen enoxaparin (LOVENOX) 40 MG/0.4ML injection Inject 0.4 mLs (40 mg total) into the skin daily. For 30 days from 08/01/2018. 0 Syringe   . feeding supplement, ENSURE ENLIVE, (ENSURE ENLIVE) LIQD Take 237 mLs by mouth 2 (two) times daily between meals.    . ferrous sulfate 325 (65 FE) MG tablet Take 325 mg by mouth daily with breakfast.    . gabapentin (NEURONTIN) 300 MG capsule Take 300 mg by mouth 3 (three) times daily.     Marland Kitchen guaiFENesin (MUCINEX) 600 MG 12 hr tablet Take 2 tablets  (1,200 mg total) by mouth 2 (two) times daily.    . insulin aspart (NOVOLOG) 100 UNIT/ML injection Inject 0-9 Units into the skin 3 (three) times daily with meals. CBG < 70: implement hypoglycemia protocol CBG 70 - 120: 0 units CBG 121 - 150: 1 unit CBG 151 - 200: 2 units CBG 201 - 250: 3 units CBG 251 - 300: 5 units CBG 301 - 350: 7 units CBG 351 - 400: 9 units CBG > 400: call MD.    . Inulin (METAMUCIL CLEAR & NATURAL) POWD Take 15 mLs by mouth daily as needed (fiber).     . latanoprost (XALATAN) 0.005 % ophthalmic solution Place 1 drop into the left eye at bedtime.    . Multiple Vitamins-Minerals (CENTRUM SILVER PO) Take 1 tablet by mouth daily.     . nitroGLYCERIN (NITROSTAT) 0.4 MG SL tablet Place 1 tablet (0.4 mg total) under the tongue every 5 (five) minutes as needed for chest pain. 25 tablet 2  . SYNTHROID 137 MCG tablet Take 137 mcg by mouth daily.  0  . tamsulosin (FLOMAX) 0.4 MG CAPS capsule Take 0.4 mg by mouth daily after breakfast.      No current facility-administered medications for this visit.     Allergies:   Patient has no known allergies.   Social History: Social History   Socioeconomic History  . Marital status: Married    Spouse name: Not on file  . Number of children: 1  . Years of education: 59  . Highest education level: Not on file  Occupational History  . Occupation: retired  Scientific laboratory technician  . Financial resource strain: Not on file  . Food insecurity:    Worry: Not on file    Inability: Not on file  . Transportation needs:    Medical: Not on file    Non-medical: Not on file  Tobacco Use  . Smoking status: Former Smoker    Types: Cigarettes    Last attempt to quit: 10/22/2004    Years since quitting: 13.8  . Smokeless tobacco: Never Used  . Tobacco comment: quit smoking pipe/cigar in 2006  Substance and Sexual Activity  . Alcohol use: No    Alcohol/week: 0.0 standard drinks  . Drug use: No  . Sexual activity: Not on file  Lifestyle  .  Physical activity:    Days per week: Not on file    Minutes per session: Not on file  . Stress: Not on file  Relationships  . Social connections:    Talks on phone: Not on file    Gets together: Not on file    Attends religious service: Not on file    Active member of club or organization: Not on file    Attends meetings of clubs or organizations: Not on file    Relationship status: Not on  file  . Intimate partner violence:    Fear of current or ex partner: Not on file    Emotionally abused: Not on file    Physically abused: Not on file    Forced sexual activity: Not on file  Other Topics Concern  . Not on file  Social History Narrative   Lives at home w/ his wife    Left-handed   Caffeine: 1 cup decaf coffee in the morning    Family History: Family History  Problem Relation Age of Onset  . Diabetes Mother   . Prostate cancer Father   . Cancer Daughter        ? unknown, in stomach  . Diabetes Unknown   . Colon cancer Neg Hx   . Esophageal cancer Neg Hx   . Rectal cancer Neg Hx   . Stomach cancer Neg Hx     Review of Systems: All other systems reviewed and are otherwise negative except as noted above.   Physical Exam: VS:  BP 118/70   Pulse 63   Ht 5\' 6"  (1.676 m)   Wt 156 lb (70.8 kg)   SpO2 99%   BMI 25.18 kg/m  , BMI Body mass index is 25.18 kg/m.  GEN- The patient is elderly appearing, alert and oriented x 3 today.   HEENT: normocephalic, atraumatic; sclera clear, conjunctiva pink; hearing intact; oropharynx clear; neck supple  Lungs- Clear to ausculation bilaterally, normal work of breathing.  No wheezes, rales, rhonchi Heart- Regular rate and rhythm  GI- soft, non-tender, non-distended, bowel sounds present  Extremities- no clubbing, cyanosis, or edema  MS- no significant deformity or atrophy Skin- warm and dry, no rash or lesion; ICD pocket well healed Psych- euthymic mood, full affect Neuro- strength and sensation are intact  ICD interrogation-  reviewed in detail today,  See PACEART report  EKG:  EKG is not ordered today.  Recent Labs: 08/05/2018: ALT 9 08/08/2018: BUN 42; Creatinine, Ser 1.51; Hemoglobin 8.3; Platelets 203; Potassium 3.8; Sodium 134   Wt Readings from Last 3 Encounters:  08/20/18 156 lb (70.8 kg)  07/31/18 156 lb (70.8 kg)  06/14/17 168 lb 9.6 oz (76.5 kg)     Other studies Reviewed: Additional studies/ records that were reviewed today include: Dr Tanna Furry office notes   Assessment and Plan:  1.  Chronic systolic dysfunction euvolemic today Stable on an appropriate medical regimen Normal ICD function See Pace Art report No changes today  2.  HTN Stable No change required today  3.  CAD No recent ischemic symptoms Continue medical therapy   Current medicines are reviewed at length with the patient today.   The patient does not have concerns regarding his medicines.  The following changes were made today:  none  Labs/ tests ordered today include: none Orders Placed This Encounter  Procedures  . CUP PACEART INCLINIC DEVICE CHECK     Disposition:   Follow up with [facility], Dr Lovena Le 6 months   Signed, Chanetta Marshall, NP 08/20/2018 11:47 AM  [address] [place] [address] [phone] (office) [phone] (fax)

## 2018-08-20 NOTE — Patient Instructions (Signed)
Medication Instructions:  none If you need a refill on your cardiac medications before your next appointment, please call your pharmacy.   Lab work: none If you have labs (blood work) drawn today and your tests are completely normal, you will receive your results only by: Marland Kitchen MyChart Message (if you have MyChart) OR . A paper copy in the mail If you have any lab test that is abnormal or we need to change your treatment, we will call you to review the results.  Testing/Procedures: none  Follow-Up: At [facility], you and your health needs are our priority.  As part of our continuing mission to provide you with exceptional heart care, we have created designated Provider Care Teams.  These Care Teams include your primary Cardiologist (physician) and Advanced Practice Providers (APPs -  Physician Assistants and Nurse Practitioners) who all work together to provide you with the care you need, when you need it. You will need a follow up appointment in 6 months.  Please call our office 2 months in advance to schedule this appointment.  You may see Dr Lovena Le or one of the following Advanced Practice Providers on your designated Care Team:   Chanetta Marshall, NP . Tommye Standard, PA-C  Any Other Special Instructions Will Be Listed Below (If Applicable). Remote monitoring is used to monitor your ICD from home. This monitoring reduces the number of office visits required to check your device to one time per year. It allows [place] to keep an eye on the functioning of your device to ensure it is working properly. You are scheduled for a device check from home on 12/30. You may send your transmission at any time that day. If you have a wireless device, the transmission will be sent automatically. After your physician reviews your transmission, you will receive a postcard with your next transmission date.

## 2018-08-26 DIAGNOSIS — N138 Other obstructive and reflux uropathy: Secondary | ICD-10-CM | POA: Diagnosis not present

## 2018-08-26 DIAGNOSIS — S72146D Nondisplaced intertrochanteric fracture of unspecified femur, subsequent encounter for closed fracture with routine healing: Secondary | ICD-10-CM | POA: Diagnosis not present

## 2018-08-26 DIAGNOSIS — I10 Essential (primary) hypertension: Secondary | ICD-10-CM | POA: Diagnosis not present

## 2018-08-26 DIAGNOSIS — I5022 Chronic systolic (congestive) heart failure: Secondary | ICD-10-CM | POA: Diagnosis not present

## 2018-08-27 DIAGNOSIS — L89312 Pressure ulcer of right buttock, stage 2: Secondary | ICD-10-CM | POA: Diagnosis not present

## 2018-08-27 DIAGNOSIS — L89610 Pressure ulcer of right heel, unstageable: Secondary | ICD-10-CM | POA: Diagnosis not present

## 2018-08-28 DIAGNOSIS — L03116 Cellulitis of left lower limb: Secondary | ICD-10-CM | POA: Diagnosis not present

## 2018-08-28 DIAGNOSIS — S72146D Nondisplaced intertrochanteric fracture of unspecified femur, subsequent encounter for closed fracture with routine healing: Secondary | ICD-10-CM | POA: Diagnosis not present

## 2018-08-28 DIAGNOSIS — L89610 Pressure ulcer of right heel, unstageable: Secondary | ICD-10-CM | POA: Diagnosis not present

## 2018-08-28 DIAGNOSIS — E1122 Type 2 diabetes mellitus with diabetic chronic kidney disease: Secondary | ICD-10-CM | POA: Diagnosis not present

## 2018-09-01 DIAGNOSIS — N138 Other obstructive and reflux uropathy: Secondary | ICD-10-CM | POA: Diagnosis not present

## 2018-09-01 DIAGNOSIS — S72146D Nondisplaced intertrochanteric fracture of unspecified femur, subsequent encounter for closed fracture with routine healing: Secondary | ICD-10-CM | POA: Diagnosis not present

## 2018-09-01 DIAGNOSIS — I5022 Chronic systolic (congestive) heart failure: Secondary | ICD-10-CM | POA: Diagnosis not present

## 2018-09-01 DIAGNOSIS — L03116 Cellulitis of left lower limb: Secondary | ICD-10-CM | POA: Diagnosis not present

## 2018-09-03 DIAGNOSIS — L89312 Pressure ulcer of right buttock, stage 2: Secondary | ICD-10-CM | POA: Diagnosis not present

## 2018-09-03 DIAGNOSIS — L89610 Pressure ulcer of right heel, unstageable: Secondary | ICD-10-CM | POA: Diagnosis not present

## 2018-09-04 ENCOUNTER — Other Ambulatory Visit: Payer: Self-pay

## 2018-09-04 NOTE — Patient Outreach (Signed)
[place] [facility]) Care Management  09/04/2018  Holten Spano Marschall [birth year] [id number]  Transition of care  Referral date: 09/04/18 Referral source: discharged from an inpatient admission from [facility] on 09/02/18.  Insurance: health team advantage Attempt #1  Telephone call to patient regarding transition of care referral. Attempted call to listed home number.  Message states number is disconnected.  Attempted call to alternate number [phone].  Voice mail would not take message.    PLAN: RNCm will attempt 2nd telephone outreach to patient within 4 business days.  RNCM will send patient outreach letter.   Quinn Plowman RN,BSN,CCM [facility] Telephonic  [phone]

## 2018-09-05 ENCOUNTER — Other Ambulatory Visit: Payer: Self-pay

## 2018-09-05 ENCOUNTER — Ambulatory Visit: Payer: Self-pay

## 2018-09-05 NOTE — Patient Outreach (Signed)
[place] [facility]) Care Management  09/05/2018  Gregory [name] Jun 15, 1933 [id number]  Transition of care  Referral date: 09/04/18 Referral source: discharged from an inpatient admission from [facility] on 09/02/18.  Insurance: health team advantage Attempt #3  Telephone call to patient regarding transition of care referral. Attempted call to listed home number.  Message states number is disconnected.  Attempted call to alternate number [phone].  Voice mail would not take message.    PLAN: RNCm will attempt 3rd telephone outreach to patient within 4 business days.     Quinn Plowman RN,BSN,CCM [facility] Telephonic  [phone]

## 2018-09-09 DIAGNOSIS — S72141D Displaced intertrochanteric fracture of right femur, subsequent encounter for closed fracture with routine healing: Secondary | ICD-10-CM | POA: Diagnosis not present

## 2018-09-10 ENCOUNTER — Ambulatory Visit: Payer: Self-pay

## 2018-09-10 ENCOUNTER — Other Ambulatory Visit: Payer: Self-pay

## 2018-09-10 DIAGNOSIS — E039 Hypothyroidism, unspecified: Secondary | ICD-10-CM | POA: Diagnosis not present

## 2018-09-10 DIAGNOSIS — S72001D Fracture of unspecified part of neck of right femur, subsequent encounter for closed fracture with routine healing: Secondary | ICD-10-CM | POA: Diagnosis not present

## 2018-09-10 DIAGNOSIS — E11621 Type 2 diabetes mellitus with foot ulcer: Secondary | ICD-10-CM | POA: Diagnosis not present

## 2018-09-10 DIAGNOSIS — I255 Ischemic cardiomyopathy: Secondary | ICD-10-CM | POA: Diagnosis not present

## 2018-09-10 DIAGNOSIS — R278 Other lack of coordination: Secondary | ICD-10-CM | POA: Diagnosis not present

## 2018-09-10 DIAGNOSIS — L89312 Pressure ulcer of right buttock, stage 2: Secondary | ICD-10-CM | POA: Diagnosis not present

## 2018-09-10 DIAGNOSIS — R2689 Other abnormalities of gait and mobility: Secondary | ICD-10-CM | POA: Diagnosis not present

## 2018-09-10 DIAGNOSIS — E1140 Type 2 diabetes mellitus with diabetic neuropathy, unspecified: Secondary | ICD-10-CM | POA: Diagnosis not present

## 2018-09-10 DIAGNOSIS — N184 Chronic kidney disease, stage 4 (severe): Secondary | ICD-10-CM | POA: Diagnosis not present

## 2018-09-10 DIAGNOSIS — L89610 Pressure ulcer of right heel, unstageable: Secondary | ICD-10-CM | POA: Diagnosis not present

## 2018-09-10 DIAGNOSIS — E1122 Type 2 diabetes mellitus with diabetic chronic kidney disease: Secondary | ICD-10-CM | POA: Diagnosis not present

## 2018-09-10 DIAGNOSIS — E440 Moderate protein-calorie malnutrition: Secondary | ICD-10-CM | POA: Diagnosis not present

## 2018-09-10 DIAGNOSIS — R41841 Cognitive communication deficit: Secondary | ICD-10-CM | POA: Diagnosis not present

## 2018-09-10 DIAGNOSIS — N401 Enlarged prostate with lower urinary tract symptoms: Secondary | ICD-10-CM | POA: Diagnosis not present

## 2018-09-10 DIAGNOSIS — D539 Nutritional anemia, unspecified: Secondary | ICD-10-CM | POA: Diagnosis not present

## 2018-09-10 DIAGNOSIS — N139 Obstructive and reflux uropathy, unspecified: Secondary | ICD-10-CM | POA: Diagnosis not present

## 2018-09-10 DIAGNOSIS — S72146D Nondisplaced intertrochanteric fracture of unspecified femur, subsequent encounter for closed fracture with routine healing: Secondary | ICD-10-CM | POA: Diagnosis not present

## 2018-09-10 DIAGNOSIS — E785 Hyperlipidemia, unspecified: Secondary | ICD-10-CM | POA: Diagnosis not present

## 2018-09-10 DIAGNOSIS — I5022 Chronic systolic (congestive) heart failure: Secondary | ICD-10-CM | POA: Diagnosis not present

## 2018-09-10 DIAGNOSIS — I10 Essential (primary) hypertension: Secondary | ICD-10-CM | POA: Diagnosis not present

## 2018-09-10 DIAGNOSIS — I119 Hypertensive heart disease without heart failure: Secondary | ICD-10-CM | POA: Diagnosis not present

## 2018-09-10 NOTE — Patient Outreach (Signed)
[place] [facility]) Care Management  09/10/2018  Gregory [name] [birth year] [id number]   Transition of care  Referral date:09/04/18 Referral source:discharged from an inpatient admission from [facility] on 09/02/18.  Insurance:health team advantage Attempt #3  Telephone call to patient regarding transition of care referral.Attempted call to listed home number. Message states number is disconnected. Attempted call to alternate number [phone]. Voice mail would not take message.    PLAN: If no return call will proceed with closure.   Quinn Plowman RN,BSN,CCM [facility] Telephonic  [phone]

## 2018-09-17 DIAGNOSIS — I482 Chronic atrial fibrillation, unspecified: Secondary | ICD-10-CM | POA: Diagnosis not present

## 2018-09-17 DIAGNOSIS — L89312 Pressure ulcer of right buttock, stage 2: Secondary | ICD-10-CM | POA: Diagnosis not present

## 2018-09-17 DIAGNOSIS — L89610 Pressure ulcer of right heel, unstageable: Secondary | ICD-10-CM | POA: Diagnosis not present

## 2018-09-17 DIAGNOSIS — N138 Other obstructive and reflux uropathy: Secondary | ICD-10-CM | POA: Diagnosis not present

## 2018-09-17 DIAGNOSIS — S72146D Nondisplaced intertrochanteric fracture of unspecified femur, subsequent encounter for closed fracture with routine healing: Secondary | ICD-10-CM | POA: Diagnosis not present

## 2018-09-22 ENCOUNTER — Other Ambulatory Visit: Payer: Self-pay

## 2018-09-22 DIAGNOSIS — I5022 Chronic systolic (congestive) heart failure: Secondary | ICD-10-CM | POA: Diagnosis not present

## 2018-09-22 DIAGNOSIS — E785 Hyperlipidemia, unspecified: Secondary | ICD-10-CM | POA: Diagnosis not present

## 2018-09-22 DIAGNOSIS — R41841 Cognitive communication deficit: Secondary | ICD-10-CM | POA: Diagnosis not present

## 2018-09-22 DIAGNOSIS — E039 Hypothyroidism, unspecified: Secondary | ICD-10-CM | POA: Diagnosis not present

## 2018-09-22 DIAGNOSIS — R278 Other lack of coordination: Secondary | ICD-10-CM | POA: Diagnosis not present

## 2018-09-22 DIAGNOSIS — I255 Ischemic cardiomyopathy: Secondary | ICD-10-CM | POA: Diagnosis not present

## 2018-09-22 DIAGNOSIS — E1122 Type 2 diabetes mellitus with diabetic chronic kidney disease: Secondary | ICD-10-CM | POA: Diagnosis not present

## 2018-09-22 DIAGNOSIS — N184 Chronic kidney disease, stage 4 (severe): Secondary | ICD-10-CM | POA: Diagnosis not present

## 2018-09-22 DIAGNOSIS — E1140 Type 2 diabetes mellitus with diabetic neuropathy, unspecified: Secondary | ICD-10-CM | POA: Diagnosis not present

## 2018-09-22 DIAGNOSIS — I119 Hypertensive heart disease without heart failure: Secondary | ICD-10-CM | POA: Diagnosis not present

## 2018-09-22 DIAGNOSIS — E440 Moderate protein-calorie malnutrition: Secondary | ICD-10-CM | POA: Diagnosis not present

## 2018-09-22 DIAGNOSIS — R2689 Other abnormalities of gait and mobility: Secondary | ICD-10-CM | POA: Diagnosis not present

## 2018-09-22 DIAGNOSIS — S72001D Fracture of unspecified part of neck of right femur, subsequent encounter for closed fracture with routine healing: Secondary | ICD-10-CM | POA: Diagnosis not present

## 2018-09-22 DIAGNOSIS — D539 Nutritional anemia, unspecified: Secondary | ICD-10-CM | POA: Diagnosis not present

## 2018-09-22 DIAGNOSIS — E11621 Type 2 diabetes mellitus with foot ulcer: Secondary | ICD-10-CM | POA: Diagnosis not present

## 2018-09-22 DIAGNOSIS — N401 Enlarged prostate with lower urinary tract symptoms: Secondary | ICD-10-CM | POA: Diagnosis not present

## 2018-09-22 DIAGNOSIS — N139 Obstructive and reflux uropathy, unspecified: Secondary | ICD-10-CM | POA: Diagnosis not present

## 2018-09-22 NOTE — Patient Outreach (Signed)
[facility] [facility]) Care Management  09/22/2018  Gregory [name] 06-18-1933 [id number]  Transition of care  Referral date:09/04/18 Referral source:discharged from an inpatient admission from [facility] on 09/02/18.  Insurance:health team advantage   No response after 3 telephone calls and outreach letter attempt.  PLAN: RNCM will close patient due to being unable to reach.  RNCM will send closure notification to patient's primary MD   Quinn Plowman RN,BSN,CCM [facility] Telephonic  [phone]

## 2018-09-24 DIAGNOSIS — L89610 Pressure ulcer of right heel, unstageable: Secondary | ICD-10-CM | POA: Diagnosis not present

## 2018-09-24 DIAGNOSIS — L89312 Pressure ulcer of right buttock, stage 2: Secondary | ICD-10-CM | POA: Diagnosis not present

## 2018-09-25 DIAGNOSIS — R338 Other retention of urine: Secondary | ICD-10-CM | POA: Diagnosis not present

## 2018-09-26 DIAGNOSIS — S72146D Nondisplaced intertrochanteric fracture of unspecified femur, subsequent encounter for closed fracture with routine healing: Secondary | ICD-10-CM | POA: Diagnosis not present

## 2018-09-26 DIAGNOSIS — E1122 Type 2 diabetes mellitus with diabetic chronic kidney disease: Secondary | ICD-10-CM | POA: Diagnosis not present

## 2018-09-26 DIAGNOSIS — N138 Other obstructive and reflux uropathy: Secondary | ICD-10-CM | POA: Diagnosis not present

## 2018-09-26 DIAGNOSIS — I10 Essential (primary) hypertension: Secondary | ICD-10-CM | POA: Diagnosis not present

## 2018-09-28 ENCOUNTER — Other Ambulatory Visit: Payer: Self-pay | Admitting: Cardiovascular Disease

## 2018-09-29 MED ORDER — AMIODARONE HCL 100 MG PO TABS: 100.0000 mg | ORAL_TABLET | Freq: Every morning | ORAL | 2 refills | Status: AC

## 2018-10-01 DIAGNOSIS — L89610 Pressure ulcer of right heel, unstageable: Secondary | ICD-10-CM | POA: Diagnosis not present

## 2018-10-01 DIAGNOSIS — L89312 Pressure ulcer of right buttock, stage 2: Secondary | ICD-10-CM | POA: Diagnosis not present

## 2018-10-03 DIAGNOSIS — I10 Essential (primary) hypertension: Secondary | ICD-10-CM | POA: Diagnosis not present

## 2018-10-03 DIAGNOSIS — N138 Other obstructive and reflux uropathy: Secondary | ICD-10-CM | POA: Diagnosis not present

## 2018-10-03 DIAGNOSIS — S72146D Nondisplaced intertrochanteric fracture of unspecified femur, subsequent encounter for closed fracture with routine healing: Secondary | ICD-10-CM | POA: Diagnosis not present

## 2018-10-03 DIAGNOSIS — E1122 Type 2 diabetes mellitus with diabetic chronic kidney disease: Secondary | ICD-10-CM | POA: Diagnosis not present

## 2018-10-08 DIAGNOSIS — E1122 Type 2 diabetes mellitus with diabetic chronic kidney disease: Secondary | ICD-10-CM | POA: Diagnosis not present

## 2018-10-08 DIAGNOSIS — I509 Heart failure, unspecified: Secondary | ICD-10-CM | POA: Diagnosis not present

## 2018-10-08 DIAGNOSIS — S72001A Fracture of unspecified part of neck of right femur, initial encounter for closed fracture: Secondary | ICD-10-CM | POA: Diagnosis not present

## 2018-10-08 DIAGNOSIS — L89610 Pressure ulcer of right heel, unstageable: Secondary | ICD-10-CM | POA: Diagnosis not present

## 2018-10-08 DIAGNOSIS — N183 Chronic kidney disease, stage 3 (moderate): Secondary | ICD-10-CM | POA: Diagnosis not present

## 2018-10-08 DIAGNOSIS — I10 Essential (primary) hypertension: Secondary | ICD-10-CM | POA: Diagnosis not present

## 2018-10-08 DIAGNOSIS — L89312 Pressure ulcer of right buttock, stage 2: Secondary | ICD-10-CM | POA: Diagnosis not present

## 2018-10-16 DIAGNOSIS — L89312 Pressure ulcer of right buttock, stage 2: Secondary | ICD-10-CM | POA: Diagnosis not present

## 2018-10-16 DIAGNOSIS — L89610 Pressure ulcer of right heel, unstageable: Secondary | ICD-10-CM | POA: Diagnosis not present

## 2018-10-20 ENCOUNTER — Ambulatory Visit (INDEPENDENT_AMBULATORY_CARE_PROVIDER_SITE_OTHER): Payer: PPO

## 2018-10-20 DIAGNOSIS — I255 Ischemic cardiomyopathy: Secondary | ICD-10-CM

## 2018-10-20 NOTE — Progress Notes (Signed)
Remote ICD transmission.   

## 2018-10-21 LAB — CUP PACEART REMOTE DEVICE CHECK
Brady Statistic RV Percent Paced: 0.32 %
Date Time Interrogation Session: 20191230062501
HighPow Impedance: 53 Ohm
Implantable Lead Implant Date: 20090204
Implantable Lead Location: 753860
Implantable Lead Model: 6935
Implantable Pulse Generator Implant Date: 20150521
Lead Channel Impedance Value: 380 Ohm
Lead Channel Pacing Threshold Amplitude: 0.875 V
Lead Channel Pacing Threshold Pulse Width: 0.4 ms
Lead Channel Sensing Intrinsic Amplitude: 11 mV
Lead Channel Setting Pacing Amplitude: 2.5 V
Lead Channel Setting Sensing Sensitivity: 0.3 mV
MDC IDC MSMT BATTERY REMAINING LONGEVITY: 95 mo
MDC IDC MSMT BATTERY VOLTAGE: 3 V
MDC IDC MSMT LEADCHNL RV IMPEDANCE VALUE: 323 Ohm
MDC IDC MSMT LEADCHNL RV SENSING INTR AMPL: 11 mV
MDC IDC SET LEADCHNL RV PACING PULSEWIDTH: 0.4 ms

## 2018-10-23 DIAGNOSIS — L89312 Pressure ulcer of right buttock, stage 2: Secondary | ICD-10-CM | POA: Diagnosis not present

## 2018-10-23 DIAGNOSIS — L89610 Pressure ulcer of right heel, unstageable: Secondary | ICD-10-CM | POA: Diagnosis not present

## 2018-10-29 DIAGNOSIS — L89312 Pressure ulcer of right buttock, stage 2: Secondary | ICD-10-CM | POA: Diagnosis not present

## 2018-10-29 DIAGNOSIS — L89610 Pressure ulcer of right heel, unstageable: Secondary | ICD-10-CM | POA: Diagnosis not present

## 2018-11-05 DIAGNOSIS — L89312 Pressure ulcer of right buttock, stage 2: Secondary | ICD-10-CM | POA: Diagnosis not present

## 2018-11-05 DIAGNOSIS — L89610 Pressure ulcer of right heel, unstageable: Secondary | ICD-10-CM | POA: Diagnosis not present

## 2018-11-12 DIAGNOSIS — L89610 Pressure ulcer of right heel, unstageable: Secondary | ICD-10-CM | POA: Diagnosis not present

## 2018-11-19 DIAGNOSIS — L89610 Pressure ulcer of right heel, unstageable: Secondary | ICD-10-CM | POA: Diagnosis not present

## 2018-11-20 DIAGNOSIS — E1122 Type 2 diabetes mellitus with diabetic chronic kidney disease: Secondary | ICD-10-CM | POA: Diagnosis not present

## 2018-11-20 DIAGNOSIS — R05 Cough: Secondary | ICD-10-CM | POA: Diagnosis not present

## 2018-11-20 DIAGNOSIS — I5022 Chronic systolic (congestive) heart failure: Secondary | ICD-10-CM | POA: Diagnosis not present

## 2018-11-20 DIAGNOSIS — L89610 Pressure ulcer of right heel, unstageable: Secondary | ICD-10-CM | POA: Diagnosis not present

## 2018-11-26 DIAGNOSIS — R338 Other retention of urine: Secondary | ICD-10-CM | POA: Diagnosis not present

## 2018-11-26 DIAGNOSIS — F419 Anxiety disorder, unspecified: Secondary | ICD-10-CM | POA: Diagnosis not present

## 2018-11-26 DIAGNOSIS — G301 Alzheimer's disease with late onset: Secondary | ICD-10-CM | POA: Diagnosis not present

## 2018-11-26 DIAGNOSIS — L89610 Pressure ulcer of right heel, unstageable: Secondary | ICD-10-CM | POA: Diagnosis not present

## 2018-11-26 DIAGNOSIS — F0280 Dementia in other diseases classified elsewhere without behavioral disturbance: Secondary | ICD-10-CM | POA: Diagnosis not present

## 2018-11-27 DIAGNOSIS — I4821 Permanent atrial fibrillation: Secondary | ICD-10-CM | POA: Diagnosis not present

## 2018-11-27 DIAGNOSIS — R079 Chest pain, unspecified: Secondary | ICD-10-CM | POA: Diagnosis not present

## 2018-11-27 DIAGNOSIS — I2510 Atherosclerotic heart disease of native coronary artery without angina pectoris: Secondary | ICD-10-CM | POA: Diagnosis not present

## 2018-11-27 DIAGNOSIS — K219 Gastro-esophageal reflux disease without esophagitis: Secondary | ICD-10-CM | POA: Diagnosis not present

## 2018-11-28 DIAGNOSIS — D649 Anemia, unspecified: Secondary | ICD-10-CM | POA: Diagnosis not present

## 2018-11-28 DIAGNOSIS — R079 Chest pain, unspecified: Secondary | ICD-10-CM | POA: Diagnosis not present

## 2018-11-28 DIAGNOSIS — N138 Other obstructive and reflux uropathy: Secondary | ICD-10-CM | POA: Diagnosis not present

## 2018-11-28 DIAGNOSIS — I4510 Unspecified right bundle-branch block: Secondary | ICD-10-CM | POA: Diagnosis not present

## 2018-11-28 DIAGNOSIS — R319 Hematuria, unspecified: Secondary | ICD-10-CM | POA: Diagnosis not present

## 2018-11-28 DIAGNOSIS — I10 Essential (primary) hypertension: Secondary | ICD-10-CM | POA: Diagnosis not present

## 2018-11-28 DIAGNOSIS — R4182 Altered mental status, unspecified: Secondary | ICD-10-CM | POA: Diagnosis not present

## 2018-11-28 DIAGNOSIS — N390 Urinary tract infection, site not specified: Secondary | ICD-10-CM | POA: Diagnosis not present

## 2018-11-28 DIAGNOSIS — E559 Vitamin D deficiency, unspecified: Secondary | ICD-10-CM | POA: Diagnosis not present

## 2018-11-28 DIAGNOSIS — E785 Hyperlipidemia, unspecified: Secondary | ICD-10-CM | POA: Diagnosis not present

## 2018-12-02 DIAGNOSIS — R41841 Cognitive communication deficit: Secondary | ICD-10-CM | POA: Diagnosis not present

## 2018-12-02 DIAGNOSIS — R2689 Other abnormalities of gait and mobility: Secondary | ICD-10-CM | POA: Diagnosis not present

## 2018-12-02 DIAGNOSIS — E1140 Type 2 diabetes mellitus with diabetic neuropathy, unspecified: Secondary | ICD-10-CM | POA: Diagnosis not present

## 2018-12-02 DIAGNOSIS — R4182 Altered mental status, unspecified: Secondary | ICD-10-CM | POA: Diagnosis not present

## 2018-12-02 DIAGNOSIS — N139 Obstructive and reflux uropathy, unspecified: Secondary | ICD-10-CM | POA: Diagnosis not present

## 2018-12-02 DIAGNOSIS — R079 Chest pain, unspecified: Secondary | ICD-10-CM | POA: Diagnosis not present

## 2018-12-02 DIAGNOSIS — N401 Enlarged prostate with lower urinary tract symptoms: Secondary | ICD-10-CM | POA: Diagnosis not present

## 2018-12-02 DIAGNOSIS — I5022 Chronic systolic (congestive) heart failure: Secondary | ICD-10-CM | POA: Diagnosis not present

## 2018-12-02 DIAGNOSIS — E785 Hyperlipidemia, unspecified: Secondary | ICD-10-CM | POA: Diagnosis not present

## 2018-12-02 DIAGNOSIS — E11621 Type 2 diabetes mellitus with foot ulcer: Secondary | ICD-10-CM | POA: Diagnosis not present

## 2018-12-02 DIAGNOSIS — S72001D Fracture of unspecified part of neck of right femur, subsequent encounter for closed fracture with routine healing: Secondary | ICD-10-CM | POA: Diagnosis not present

## 2018-12-02 DIAGNOSIS — G301 Alzheimer's disease with late onset: Secondary | ICD-10-CM | POA: Diagnosis not present

## 2018-12-02 DIAGNOSIS — I2510 Atherosclerotic heart disease of native coronary artery without angina pectoris: Secondary | ICD-10-CM | POA: Diagnosis not present

## 2018-12-02 DIAGNOSIS — E1122 Type 2 diabetes mellitus with diabetic chronic kidney disease: Secondary | ICD-10-CM | POA: Diagnosis not present

## 2018-12-02 DIAGNOSIS — E039 Hypothyroidism, unspecified: Secondary | ICD-10-CM | POA: Diagnosis not present

## 2018-12-02 DIAGNOSIS — D539 Nutritional anemia, unspecified: Secondary | ICD-10-CM | POA: Diagnosis not present

## 2018-12-02 DIAGNOSIS — N184 Chronic kidney disease, stage 4 (severe): Secondary | ICD-10-CM | POA: Diagnosis not present

## 2018-12-02 DIAGNOSIS — I119 Hypertensive heart disease without heart failure: Secondary | ICD-10-CM | POA: Diagnosis not present

## 2018-12-02 DIAGNOSIS — E440 Moderate protein-calorie malnutrition: Secondary | ICD-10-CM | POA: Diagnosis not present

## 2018-12-02 DIAGNOSIS — I10 Essential (primary) hypertension: Secondary | ICD-10-CM | POA: Diagnosis not present

## 2018-12-02 DIAGNOSIS — F331 Major depressive disorder, recurrent, moderate: Secondary | ICD-10-CM | POA: Diagnosis not present

## 2018-12-02 DIAGNOSIS — I255 Ischemic cardiomyopathy: Secondary | ICD-10-CM | POA: Diagnosis not present

## 2018-12-02 DIAGNOSIS — R278 Other lack of coordination: Secondary | ICD-10-CM | POA: Diagnosis not present

## 2018-12-03 DIAGNOSIS — L89610 Pressure ulcer of right heel, unstageable: Secondary | ICD-10-CM | POA: Diagnosis not present

## 2018-12-03 DIAGNOSIS — I509 Heart failure, unspecified: Secondary | ICD-10-CM | POA: Diagnosis not present

## 2018-12-03 DIAGNOSIS — E1122 Type 2 diabetes mellitus with diabetic chronic kidney disease: Secondary | ICD-10-CM | POA: Diagnosis not present

## 2018-12-03 DIAGNOSIS — I10 Essential (primary) hypertension: Secondary | ICD-10-CM | POA: Diagnosis not present

## 2018-12-03 DIAGNOSIS — N320 Bladder-neck obstruction: Secondary | ICD-10-CM | POA: Diagnosis not present

## 2018-12-03 DIAGNOSIS — N183 Chronic kidney disease, stage 3 (moderate): Secondary | ICD-10-CM | POA: Diagnosis not present

## 2018-12-04 DIAGNOSIS — E785 Hyperlipidemia, unspecified: Secondary | ICD-10-CM | POA: Diagnosis not present

## 2018-12-04 DIAGNOSIS — E559 Vitamin D deficiency, unspecified: Secondary | ICD-10-CM | POA: Diagnosis not present

## 2018-12-10 DIAGNOSIS — G301 Alzheimer's disease with late onset: Secondary | ICD-10-CM | POA: Diagnosis not present

## 2018-12-10 DIAGNOSIS — L89610 Pressure ulcer of right heel, unstageable: Secondary | ICD-10-CM | POA: Diagnosis not present

## 2018-12-10 DIAGNOSIS — F0280 Dementia in other diseases classified elsewhere without behavioral disturbance: Secondary | ICD-10-CM | POA: Diagnosis not present

## 2018-12-10 DIAGNOSIS — F419 Anxiety disorder, unspecified: Secondary | ICD-10-CM | POA: Diagnosis not present

## 2018-12-17 DIAGNOSIS — L89610 Pressure ulcer of right heel, unstageable: Secondary | ICD-10-CM | POA: Diagnosis not present

## 2018-12-19 DIAGNOSIS — Z79899 Other long term (current) drug therapy: Secondary | ICD-10-CM | POA: Diagnosis not present

## 2018-12-19 DIAGNOSIS — R338 Other retention of urine: Secondary | ICD-10-CM | POA: Diagnosis not present

## 2018-12-19 DIAGNOSIS — E039 Hypothyroidism, unspecified: Secondary | ICD-10-CM | POA: Diagnosis not present

## 2018-12-21 DIAGNOSIS — I5022 Chronic systolic (congestive) heart failure: Secondary | ICD-10-CM | POA: Diagnosis not present

## 2018-12-21 DIAGNOSIS — E1122 Type 2 diabetes mellitus with diabetic chronic kidney disease: Secondary | ICD-10-CM | POA: Diagnosis not present

## 2018-12-21 DIAGNOSIS — L89610 Pressure ulcer of right heel, unstageable: Secondary | ICD-10-CM | POA: Diagnosis not present

## 2018-12-21 DIAGNOSIS — I482 Chronic atrial fibrillation, unspecified: Secondary | ICD-10-CM | POA: Diagnosis not present

## 2018-12-22 DIAGNOSIS — I119 Hypertensive heart disease without heart failure: Secondary | ICD-10-CM | POA: Diagnosis not present

## 2018-12-22 DIAGNOSIS — E785 Hyperlipidemia, unspecified: Secondary | ICD-10-CM | POA: Diagnosis not present

## 2018-12-22 DIAGNOSIS — N184 Chronic kidney disease, stage 4 (severe): Secondary | ICD-10-CM | POA: Diagnosis not present

## 2018-12-22 DIAGNOSIS — R2689 Other abnormalities of gait and mobility: Secondary | ICD-10-CM | POA: Diagnosis not present

## 2018-12-22 DIAGNOSIS — N139 Obstructive and reflux uropathy, unspecified: Secondary | ICD-10-CM | POA: Diagnosis not present

## 2018-12-22 DIAGNOSIS — R41841 Cognitive communication deficit: Secondary | ICD-10-CM | POA: Diagnosis not present

## 2018-12-22 DIAGNOSIS — I255 Ischemic cardiomyopathy: Secondary | ICD-10-CM | POA: Diagnosis not present

## 2018-12-22 DIAGNOSIS — E440 Moderate protein-calorie malnutrition: Secondary | ICD-10-CM | POA: Diagnosis not present

## 2018-12-22 DIAGNOSIS — E11621 Type 2 diabetes mellitus with foot ulcer: Secondary | ICD-10-CM | POA: Diagnosis not present

## 2018-12-22 DIAGNOSIS — D539 Nutritional anemia, unspecified: Secondary | ICD-10-CM | POA: Diagnosis not present

## 2018-12-22 DIAGNOSIS — R278 Other lack of coordination: Secondary | ICD-10-CM | POA: Diagnosis not present

## 2018-12-22 DIAGNOSIS — N401 Enlarged prostate with lower urinary tract symptoms: Secondary | ICD-10-CM | POA: Diagnosis not present

## 2018-12-22 DIAGNOSIS — S72001D Fracture of unspecified part of neck of right femur, subsequent encounter for closed fracture with routine healing: Secondary | ICD-10-CM | POA: Diagnosis not present

## 2018-12-22 DIAGNOSIS — E039 Hypothyroidism, unspecified: Secondary | ICD-10-CM | POA: Diagnosis not present

## 2018-12-22 DIAGNOSIS — I5022 Chronic systolic (congestive) heart failure: Secondary | ICD-10-CM | POA: Diagnosis not present

## 2018-12-22 DIAGNOSIS — E1122 Type 2 diabetes mellitus with diabetic chronic kidney disease: Secondary | ICD-10-CM | POA: Diagnosis not present

## 2018-12-22 DIAGNOSIS — E1140 Type 2 diabetes mellitus with diabetic neuropathy, unspecified: Secondary | ICD-10-CM | POA: Diagnosis not present

## 2018-12-24 DIAGNOSIS — L97512 Non-pressure chronic ulcer of other part of right foot with fat layer exposed: Secondary | ICD-10-CM | POA: Diagnosis not present

## 2018-12-24 DIAGNOSIS — L89610 Pressure ulcer of right heel, unstageable: Secondary | ICD-10-CM | POA: Diagnosis not present

## 2018-12-25 DIAGNOSIS — L97509 Non-pressure chronic ulcer of other part of unspecified foot with unspecified severity: Secondary | ICD-10-CM | POA: Diagnosis not present

## 2018-12-30 DIAGNOSIS — I10 Essential (primary) hypertension: Secondary | ICD-10-CM | POA: Diagnosis not present

## 2018-12-30 DIAGNOSIS — I5022 Chronic systolic (congestive) heart failure: Secondary | ICD-10-CM | POA: Diagnosis not present

## 2018-12-30 DIAGNOSIS — E1122 Type 2 diabetes mellitus with diabetic chronic kidney disease: Secondary | ICD-10-CM | POA: Diagnosis not present

## 2018-12-30 DIAGNOSIS — D649 Anemia, unspecified: Secondary | ICD-10-CM | POA: Diagnosis not present

## 2018-12-30 DIAGNOSIS — R05 Cough: Secondary | ICD-10-CM | POA: Diagnosis not present

## 2018-12-30 DIAGNOSIS — I2510 Atherosclerotic heart disease of native coronary artery without angina pectoris: Secondary | ICD-10-CM | POA: Diagnosis not present

## 2018-12-31 DIAGNOSIS — F0280 Dementia in other diseases classified elsewhere without behavioral disturbance: Secondary | ICD-10-CM | POA: Diagnosis not present

## 2018-12-31 DIAGNOSIS — F419 Anxiety disorder, unspecified: Secondary | ICD-10-CM | POA: Diagnosis not present

## 2018-12-31 DIAGNOSIS — L97512 Non-pressure chronic ulcer of other part of right foot with fat layer exposed: Secondary | ICD-10-CM | POA: Diagnosis not present

## 2018-12-31 DIAGNOSIS — L89610 Pressure ulcer of right heel, unstageable: Secondary | ICD-10-CM | POA: Diagnosis not present

## 2018-12-31 DIAGNOSIS — G301 Alzheimer's disease with late onset: Secondary | ICD-10-CM | POA: Diagnosis not present

## 2019-01-01 DIAGNOSIS — I482 Chronic atrial fibrillation, unspecified: Secondary | ICD-10-CM | POA: Diagnosis not present

## 2019-01-01 DIAGNOSIS — I2510 Atherosclerotic heart disease of native coronary artery without angina pectoris: Secondary | ICD-10-CM | POA: Diagnosis not present

## 2019-01-01 DIAGNOSIS — I5022 Chronic systolic (congestive) heart failure: Secondary | ICD-10-CM | POA: Diagnosis not present

## 2019-01-01 DIAGNOSIS — J189 Pneumonia, unspecified organism: Secondary | ICD-10-CM | POA: Diagnosis not present

## 2019-01-02 DIAGNOSIS — J189 Pneumonia, unspecified organism: Secondary | ICD-10-CM | POA: Diagnosis not present

## 2019-01-02 DIAGNOSIS — I10 Essential (primary) hypertension: Secondary | ICD-10-CM | POA: Diagnosis not present

## 2019-01-02 DIAGNOSIS — I482 Chronic atrial fibrillation, unspecified: Secondary | ICD-10-CM | POA: Diagnosis not present

## 2019-01-02 DIAGNOSIS — I5022 Chronic systolic (congestive) heart failure: Secondary | ICD-10-CM | POA: Diagnosis not present

## 2019-01-03 DIAGNOSIS — I5022 Chronic systolic (congestive) heart failure: Secondary | ICD-10-CM | POA: Diagnosis not present

## 2019-01-06 DIAGNOSIS — R0602 Shortness of breath: Secondary | ICD-10-CM | POA: Diagnosis not present

## 2019-01-06 DIAGNOSIS — I10 Essential (primary) hypertension: Secondary | ICD-10-CM | POA: Diagnosis not present

## 2019-01-06 DIAGNOSIS — I5022 Chronic systolic (congestive) heart failure: Secondary | ICD-10-CM | POA: Diagnosis not present

## 2019-01-06 DIAGNOSIS — R05 Cough: Secondary | ICD-10-CM | POA: Diagnosis not present

## 2019-01-06 DIAGNOSIS — E1122 Type 2 diabetes mellitus with diabetic chronic kidney disease: Secondary | ICD-10-CM | POA: Diagnosis not present

## 2019-01-06 DIAGNOSIS — J189 Pneumonia, unspecified organism: Secondary | ICD-10-CM | POA: Diagnosis not present

## 2019-01-06 DIAGNOSIS — D649 Anemia, unspecified: Secondary | ICD-10-CM | POA: Diagnosis not present

## 2019-01-07 DIAGNOSIS — L89610 Pressure ulcer of right heel, unstageable: Secondary | ICD-10-CM | POA: Diagnosis not present

## 2019-01-07 DIAGNOSIS — J189 Pneumonia, unspecified organism: Secondary | ICD-10-CM | POA: Diagnosis not present

## 2019-01-07 DIAGNOSIS — I5022 Chronic systolic (congestive) heart failure: Secondary | ICD-10-CM | POA: Diagnosis not present

## 2019-01-07 DIAGNOSIS — L89152 Pressure ulcer of sacral region, stage 2: Secondary | ICD-10-CM | POA: Diagnosis not present

## 2019-01-07 DIAGNOSIS — I482 Chronic atrial fibrillation, unspecified: Secondary | ICD-10-CM | POA: Diagnosis not present

## 2019-01-07 DIAGNOSIS — J918 Pleural effusion in other conditions classified elsewhere: Secondary | ICD-10-CM | POA: Diagnosis not present

## 2019-01-07 DIAGNOSIS — L97512 Non-pressure chronic ulcer of other part of right foot with fat layer exposed: Secondary | ICD-10-CM | POA: Diagnosis not present

## 2019-01-08 DIAGNOSIS — I5022 Chronic systolic (congestive) heart failure: Secondary | ICD-10-CM | POA: Diagnosis not present

## 2019-01-08 DIAGNOSIS — J918 Pleural effusion in other conditions classified elsewhere: Secondary | ICD-10-CM | POA: Diagnosis not present

## 2019-01-08 DIAGNOSIS — I482 Chronic atrial fibrillation, unspecified: Secondary | ICD-10-CM | POA: Diagnosis not present

## 2019-01-08 DIAGNOSIS — J189 Pneumonia, unspecified organism: Secondary | ICD-10-CM | POA: Diagnosis not present

## 2019-01-09 DIAGNOSIS — R05 Cough: Secondary | ICD-10-CM | POA: Diagnosis not present

## 2019-01-09 DIAGNOSIS — I482 Chronic atrial fibrillation, unspecified: Secondary | ICD-10-CM | POA: Diagnosis not present

## 2019-01-09 DIAGNOSIS — J189 Pneumonia, unspecified organism: Secondary | ICD-10-CM | POA: Diagnosis not present

## 2019-01-09 DIAGNOSIS — E559 Vitamin D deficiency, unspecified: Secondary | ICD-10-CM | POA: Diagnosis not present

## 2019-01-09 DIAGNOSIS — I5022 Chronic systolic (congestive) heart failure: Secondary | ICD-10-CM | POA: Diagnosis not present

## 2019-01-09 DIAGNOSIS — I10 Essential (primary) hypertension: Secondary | ICD-10-CM | POA: Diagnosis not present

## 2019-01-09 DIAGNOSIS — D649 Anemia, unspecified: Secondary | ICD-10-CM | POA: Diagnosis not present

## 2019-01-09 DIAGNOSIS — E785 Hyperlipidemia, unspecified: Secondary | ICD-10-CM | POA: Diagnosis not present

## 2019-01-09 DIAGNOSIS — J918 Pleural effusion in other conditions classified elsewhere: Secondary | ICD-10-CM | POA: Diagnosis not present

## 2019-01-12 DIAGNOSIS — I5022 Chronic systolic (congestive) heart failure: Secondary | ICD-10-CM | POA: Diagnosis not present

## 2019-01-12 DIAGNOSIS — J918 Pleural effusion in other conditions classified elsewhere: Secondary | ICD-10-CM | POA: Diagnosis not present

## 2019-01-12 DIAGNOSIS — J189 Pneumonia, unspecified organism: Secondary | ICD-10-CM | POA: Diagnosis not present

## 2019-01-12 DIAGNOSIS — J9601 Acute respiratory failure with hypoxia: Secondary | ICD-10-CM | POA: Diagnosis not present

## 2019-01-13 ENCOUNTER — Inpatient Hospital Stay (HOSPITAL_COMMUNITY)
Admission: EM | Admit: 2019-01-13 | Discharge: 2019-01-21 | DRG: 208 | Disposition: E | Payer: PPO | Source: Skilled Nursing Facility | Attending: Pulmonary Disease | Admitting: Pulmonary Disease

## 2019-01-13 ENCOUNTER — Emergency Department (HOSPITAL_COMMUNITY): Payer: PPO

## 2019-01-13 DIAGNOSIS — I255 Ischemic cardiomyopathy: Secondary | ICD-10-CM | POA: Diagnosis present

## 2019-01-13 DIAGNOSIS — I454 Nonspecific intraventricular block: Secondary | ICD-10-CM | POA: Diagnosis not present

## 2019-01-13 DIAGNOSIS — L89629 Pressure ulcer of left heel, unspecified stage: Secondary | ICD-10-CM | POA: Diagnosis not present

## 2019-01-13 DIAGNOSIS — E1140 Type 2 diabetes mellitus with diabetic neuropathy, unspecified: Secondary | ICD-10-CM | POA: Diagnosis present

## 2019-01-13 DIAGNOSIS — E1165 Type 2 diabetes mellitus with hyperglycemia: Secondary | ICD-10-CM | POA: Diagnosis not present

## 2019-01-13 DIAGNOSIS — I468 Cardiac arrest due to other underlying condition: Secondary | ICD-10-CM | POA: Diagnosis not present

## 2019-01-13 DIAGNOSIS — I499 Cardiac arrhythmia, unspecified: Secondary | ICD-10-CM | POA: Diagnosis not present

## 2019-01-13 DIAGNOSIS — G931 Anoxic brain damage, not elsewhere classified: Secondary | ICD-10-CM | POA: Diagnosis not present

## 2019-01-13 DIAGNOSIS — E1152 Type 2 diabetes mellitus with diabetic peripheral angiopathy with gangrene: Secondary | ICD-10-CM | POA: Diagnosis not present

## 2019-01-13 DIAGNOSIS — I5022 Chronic systolic (congestive) heart failure: Secondary | ICD-10-CM | POA: Diagnosis present

## 2019-01-13 DIAGNOSIS — R404 Transient alteration of awareness: Secondary | ICD-10-CM | POA: Diagnosis not present

## 2019-01-13 DIAGNOSIS — I252 Old myocardial infarction: Secondary | ICD-10-CM | POA: Diagnosis not present

## 2019-01-13 DIAGNOSIS — Z7189 Other specified counseling: Secondary | ICD-10-CM | POA: Diagnosis not present

## 2019-01-13 DIAGNOSIS — E871 Hypo-osmolality and hyponatremia: Secondary | ICD-10-CM | POA: Diagnosis present

## 2019-01-13 DIAGNOSIS — R64 Cachexia: Secondary | ICD-10-CM | POA: Diagnosis not present

## 2019-01-13 DIAGNOSIS — Z87891 Personal history of nicotine dependence: Secondary | ICD-10-CM

## 2019-01-13 DIAGNOSIS — Z66 Do not resuscitate: Secondary | ICD-10-CM | POA: Diagnosis present

## 2019-01-13 DIAGNOSIS — I700 Atherosclerosis of aorta: Secondary | ICD-10-CM | POA: Diagnosis not present

## 2019-01-13 DIAGNOSIS — E785 Hyperlipidemia, unspecified: Secondary | ICD-10-CM | POA: Diagnosis not present

## 2019-01-13 DIAGNOSIS — J918 Pleural effusion in other conditions classified elsewhere: Secondary | ICD-10-CM | POA: Diagnosis not present

## 2019-01-13 DIAGNOSIS — I361 Nonrheumatic tricuspid (valve) insufficiency: Secondary | ICD-10-CM | POA: Diagnosis not present

## 2019-01-13 DIAGNOSIS — Z8719 Personal history of other diseases of the digestive system: Secondary | ICD-10-CM

## 2019-01-13 DIAGNOSIS — E039 Hypothyroidism, unspecified: Secondary | ICD-10-CM | POA: Diagnosis present

## 2019-01-13 DIAGNOSIS — K5790 Diverticulosis of intestine, part unspecified, without perforation or abscess without bleeding: Secondary | ICD-10-CM | POA: Diagnosis present

## 2019-01-13 DIAGNOSIS — I469 Cardiac arrest, cause unspecified: Secondary | ICD-10-CM | POA: Diagnosis present

## 2019-01-13 DIAGNOSIS — Z6820 Body mass index (BMI) 20.0-20.9, adult: Secondary | ICD-10-CM | POA: Diagnosis not present

## 2019-01-13 DIAGNOSIS — T17920A Food in respiratory tract, part unspecified causing asphyxiation, initial encounter: Secondary | ICD-10-CM | POA: Diagnosis not present

## 2019-01-13 DIAGNOSIS — E876 Hypokalemia: Secondary | ICD-10-CM | POA: Diagnosis not present

## 2019-01-13 DIAGNOSIS — Z9049 Acquired absence of other specified parts of digestive tract: Secondary | ICD-10-CM

## 2019-01-13 DIAGNOSIS — I340 Nonrheumatic mitral (valve) insufficiency: Secondary | ICD-10-CM | POA: Diagnosis not present

## 2019-01-13 DIAGNOSIS — Z79890 Hormone replacement therapy (postmenopausal): Secondary | ICD-10-CM

## 2019-01-13 DIAGNOSIS — R5381 Other malaise: Secondary | ICD-10-CM | POA: Diagnosis not present

## 2019-01-13 DIAGNOSIS — H409 Unspecified glaucoma: Secondary | ICD-10-CM | POA: Diagnosis present

## 2019-01-13 DIAGNOSIS — Z7401 Bed confinement status: Secondary | ICD-10-CM

## 2019-01-13 DIAGNOSIS — I110 Hypertensive heart disease with heart failure: Secondary | ICD-10-CM | POA: Diagnosis present

## 2019-01-13 DIAGNOSIS — E46 Unspecified protein-calorie malnutrition: Secondary | ICD-10-CM | POA: Diagnosis present

## 2019-01-13 DIAGNOSIS — J9601 Acute respiratory failure with hypoxia: Secondary | ICD-10-CM | POA: Diagnosis not present

## 2019-01-13 DIAGNOSIS — Z515 Encounter for palliative care: Secondary | ICD-10-CM | POA: Diagnosis not present

## 2019-01-13 DIAGNOSIS — Z7982 Long term (current) use of aspirin: Secondary | ICD-10-CM

## 2019-01-13 DIAGNOSIS — Z8042 Family history of malignant neoplasm of prostate: Secondary | ICD-10-CM

## 2019-01-13 DIAGNOSIS — Z95810 Presence of automatic (implantable) cardiac defibrillator: Secondary | ICD-10-CM

## 2019-01-13 DIAGNOSIS — L8990 Pressure ulcer of unspecified site, unspecified stage: Secondary | ICD-10-CM

## 2019-01-13 DIAGNOSIS — R0902 Hypoxemia: Secondary | ICD-10-CM | POA: Diagnosis not present

## 2019-01-13 DIAGNOSIS — R579 Shock, unspecified: Secondary | ICD-10-CM | POA: Diagnosis not present

## 2019-01-13 DIAGNOSIS — Z833 Family history of diabetes mellitus: Secondary | ICD-10-CM

## 2019-01-13 DIAGNOSIS — Z951 Presence of aortocoronary bypass graft: Secondary | ICD-10-CM

## 2019-01-13 DIAGNOSIS — I517 Cardiomegaly: Secondary | ICD-10-CM | POA: Diagnosis not present

## 2019-01-13 DIAGNOSIS — J690 Pneumonitis due to inhalation of food and vomit: Secondary | ICD-10-CM

## 2019-01-13 DIAGNOSIS — R092 Respiratory arrest: Secondary | ICD-10-CM

## 2019-01-13 DIAGNOSIS — Z794 Long term (current) use of insulin: Secondary | ICD-10-CM

## 2019-01-13 DIAGNOSIS — Z79899 Other long term (current) drug therapy: Secondary | ICD-10-CM

## 2019-01-13 DIAGNOSIS — J189 Pneumonia, unspecified organism: Secondary | ICD-10-CM | POA: Diagnosis not present

## 2019-01-13 LAB — URINALYSIS, ROUTINE W REFLEX MICROSCOPIC
Bilirubin Urine: NEGATIVE
Glucose, UA: NEGATIVE mg/dL
Ketones, ur: NEGATIVE mg/dL
Nitrite: NEGATIVE
Protein, ur: 100 mg/dL — AB
Specific Gravity, Urine: 1.019 (ref 1.005–1.030)
WBC, UA: >50 WBC/hpf — ABNORMAL HIGH (ref 0–5)
pH: 6 (ref 5.0–8.0)

## 2019-01-13 LAB — BLOOD GAS, ARTERIAL
Acid-Base Excess: 3.6 mmol/L — ABNORMAL HIGH (ref 0.0–2.0)
Bicarbonate: 26.3 mmol/L (ref 20.0–28.0)
Drawn by: 27011
FIO2: 40
MECHVT: 600 mL
O2 Saturation: 96.1 %
PEEP: 5 cmH2O
PO2 ART: 74.1 mmHg — AB (ref 83.0–108.0)
Patient temperature: 98.6
RATE: 12 resp/min
pCO2 arterial: 31.5 mmHg — ABNORMAL LOW (ref 32.0–48.0)
pH, Arterial: 7.532 — ABNORMAL HIGH (ref 7.350–7.450)

## 2019-01-13 LAB — CBC WITH DIFFERENTIAL/PLATELET
Abs Immature Granulocytes: 0 10*3/uL (ref 0.00–0.07)
Basophils Absolute: 0 10*3/uL (ref 0.0–0.1)
Basophils Relative: 0 %
Eosinophils Absolute: 0.6 10*3/uL — ABNORMAL HIGH (ref 0.0–0.5)
Eosinophils Relative: 3 %
HCT: 38.5 % — ABNORMAL LOW (ref 39.0–52.0)
Hemoglobin: 11.5 g/dL — ABNORMAL LOW (ref 13.0–17.0)
Lymphocytes Relative: 29 %
Lymphs Abs: 5.9 10*3/uL — ABNORMAL HIGH (ref 0.7–4.0)
MCH: 30 pg (ref 26.0–34.0)
MCHC: 29.9 g/dL — ABNORMAL LOW (ref 30.0–36.0)
MCV: 100.5 fL — ABNORMAL HIGH (ref 80.0–100.0)
Monocytes Absolute: 1.6 10*3/uL — ABNORMAL HIGH (ref 0.1–1.0)
Monocytes Relative: 8 %
NEUTROS ABS: 12.2 10*3/uL — AB (ref 1.7–7.7)
NRBC: 0 /100{WBCs}
Neutrophils Relative %: 60 %
Platelets: 283 10*3/uL (ref 150–400)
RBC: 3.83 MIL/uL — ABNORMAL LOW (ref 4.22–5.81)
RDW: 14 % (ref 11.5–15.5)
WBC: 20.3 10*3/uL — ABNORMAL HIGH (ref 4.0–10.5)
nRBC: 0 % (ref 0.0–0.2)

## 2019-01-13 LAB — GLUCOSE, CAPILLARY
Glucose-Capillary: 276 mg/dL — ABNORMAL HIGH (ref 70–99)
Glucose-Capillary: 165 mg/dL — ABNORMAL HIGH (ref 70–99)

## 2019-01-13 LAB — COMPREHENSIVE METABOLIC PANEL
ALBUMIN: 1.7 g/dL — AB (ref 3.5–5.0)
ALT: 28 U/L (ref 0–44)
AST: 55 U/L — ABNORMAL HIGH (ref 15–41)
Alkaline Phosphatase: 135 U/L — ABNORMAL HIGH (ref 38–126)
Anion gap: 8 (ref 5–15)
BUN: 14 mg/dL (ref 8–23)
CO2: 27 mmol/L (ref 22–32)
Calcium: 8 mg/dL — ABNORMAL LOW (ref 8.9–10.3)
Chloride: 98 mmol/L (ref 98–111)
Creatinine, Ser: 1.01 mg/dL (ref 0.61–1.24)
GFR calc Af Amer: >60 mL/min (ref >60)
GFR calc non Af Amer: >60 mL/min (ref >60)
GLUCOSE: 230 mg/dL — AB (ref 70–99)
Potassium: 3.5 mmol/L (ref 3.5–5.1)
Sodium: 133 mmol/L — ABNORMAL LOW (ref 135–145)
TOTAL PROTEIN: 5.8 g/dL — AB (ref 6.5–8.1)
Total Bilirubin: 0.8 mg/dL (ref 0.3–1.2)

## 2019-01-13 LAB — POCT I-STAT 7, (LYTES, BLD GAS, ICA,H+H)
Acid-Base Excess: 8 mmol/L — ABNORMAL HIGH (ref 0.0–2.0)
Bicarbonate: 29.6 mmol/L — ABNORMAL HIGH (ref 20.0–28.0)
Calcium, Ion: 1.09 mmol/L — ABNORMAL LOW (ref 1.15–1.40)
HCT: 36 % — ABNORMAL LOW (ref 39.0–52.0)
Hemoglobin: 12.2 g/dL — ABNORMAL LOW (ref 13.0–17.0)
O2 SAT: 100 %
PCO2 ART: 30.8 mmHg — AB (ref 32.0–48.0)
Potassium: 3.1 mmol/L — ABNORMAL LOW (ref 3.5–5.1)
Sodium: 133 mmol/L — ABNORMAL LOW (ref 135–145)
TCO2: 31 mmol/L (ref 22–32)
pH, Arterial: 7.591 — ABNORMAL HIGH (ref 7.350–7.450)
pO2, Arterial: 406 mmHg — ABNORMAL HIGH (ref 83.0–108.0)

## 2019-01-13 LAB — RAPID URINE DRUG SCREEN, HOSP PERFORMED
Amphetamines: NOT DETECTED
Barbiturates: NOT DETECTED
Benzodiazepines: NOT DETECTED
COCAINE: NOT DETECTED
Opiates: NOT DETECTED
Tetrahydrocannabinol: NOT DETECTED

## 2019-01-13 LAB — PROTIME-INR
INR: 1.5 — ABNORMAL HIGH (ref 0.8–1.2)
PROTHROMBIN TIME: 17.8 s — AB (ref 11.4–15.2)

## 2019-01-13 LAB — I-STAT CREATININE, ED: Creatinine, Ser: 0.8 mg/dL (ref 0.61–1.24)

## 2019-01-13 LAB — APTT: aPTT: 38 seconds — ABNORMAL HIGH (ref 24–36)

## 2019-01-13 MED ORDER — CHLORHEXIDINE GLUCONATE 0.12% ORAL RINSE (MEDLINE KIT)
15.0000 mL | Freq: Two times a day (BID) | OROMUCOSAL | Status: DC
  Administered 2019-01-13 – 2019-01-15 (×4): 15 mL via OROMUCOSAL

## 2019-01-13 MED ORDER — NOREPINEPHRINE 4 MG/250ML-% IV SOLN
INTRAVENOUS | Status: AC
  Filled 2019-01-13: qty 250

## 2019-01-13 MED ORDER — FENTANYL 2500MCG IN NS 250ML (10MCG/ML) PREMIX INFUSION
0.0000 ug/h | INTRAVENOUS | Status: DC
  Administered 2019-01-13: 25 ug/h via INTRAVENOUS
  Filled 2019-01-13 (×2): qty 250

## 2019-01-13 MED ORDER — FENTANYL 2500MCG IN NS 250ML (10MCG/ML) PREMIX INFUSION: 0.0000 ug/h | INTRAVENOUS | Status: DC

## 2019-01-13 MED ORDER — AMIODARONE HCL 100 MG PO TABS
100.0000 mg | ORAL_TABLET | Freq: Every morning | ORAL | Status: DC
  Administered 2019-01-14 – 2019-01-15 (×2): 100 mg
  Filled 2019-01-13 (×2): qty 1

## 2019-01-13 MED ORDER — MIDAZOLAM HCL 2 MG/2ML IJ SOLN
2.0000 mg | Freq: Once | INTRAMUSCULAR | Status: AC
  Administered 2019-01-13: 2 mg via INTRAVENOUS

## 2019-01-13 MED ORDER — INSULIN ASPART 100 UNIT/ML ~~LOC~~ SOLN
0.0000 [IU] | SUBCUTANEOUS | Status: DC
  Administered 2019-01-13: 5 [IU] via SUBCUTANEOUS
  Administered 2019-01-13: 2 [IU] via SUBCUTANEOUS
  Administered 2019-01-14 (×2): 1 [IU] via SUBCUTANEOUS
  Administered 2019-01-14: 2 [IU] via SUBCUTANEOUS
  Administered 2019-01-14 – 2019-01-15 (×3): 1 [IU] via SUBCUTANEOUS
  Administered 2019-01-15: 2 [IU] via SUBCUTANEOUS

## 2019-01-13 MED ORDER — LEVOTHYROXINE SODIUM 25 MCG PO TABS
137.0000 ug | ORAL_TABLET | Freq: Every day | ORAL | Status: DC
  Administered 2019-01-14 – 2019-01-15 (×2): 137 ug
  Filled 2019-01-13 (×2): qty 1

## 2019-01-13 MED ORDER — PIPERACILLIN-TAZOBACTAM 3.375 G IVPB 30 MIN
3.3750 g | Freq: Once | INTRAVENOUS | Status: AC
  Administered 2019-01-13: 3.375 g via INTRAVENOUS
  Filled 2019-01-13: qty 50

## 2019-01-13 MED ORDER — VANCOMYCIN HCL IN DEXTROSE 1-5 GM/200ML-% IV SOLN
1000.0000 mg | Freq: Once | INTRAVENOUS | Status: AC
  Administered 2019-01-13: 1000 mg via INTRAVENOUS
  Filled 2019-01-13: qty 200

## 2019-01-13 MED ORDER — FENTANYL CITRATE (PF) 100 MCG/2ML IJ SOLN
INTRAMUSCULAR | Status: AC
  Filled 2019-01-13: qty 2

## 2019-01-13 MED ORDER — FAMOTIDINE 20 MG IN NS 100 ML IVPB
20.0000 mg | Freq: Two times a day (BID) | INTRAVENOUS | Status: DC
  Administered 2019-01-13 – 2019-01-14 (×3): 20 mg via INTRAVENOUS
  Filled 2019-01-13 (×4): qty 100

## 2019-01-13 MED ORDER — ACETAMINOPHEN 325 MG PO TABS
650.0000 mg | ORAL_TABLET | ORAL | Status: DC | PRN
  Filled 2019-01-13: qty 2

## 2019-01-13 MED ORDER — ORAL CARE MOUTH RINSE
15.0000 mL | OROMUCOSAL | Status: DC
  Administered 2019-01-13 – 2019-01-15 (×16): 15 mL via OROMUCOSAL

## 2019-01-13 MED ORDER — MIDAZOLAM HCL 2 MG/2ML IJ SOLN
INTRAMUSCULAR | Status: AC
  Filled 2019-01-13: qty 2

## 2019-01-13 MED ORDER — HEPARIN SODIUM (PORCINE) 5000 UNIT/ML IJ SOLN
5000.0000 [IU] | Freq: Three times a day (TID) | INTRAMUSCULAR | Status: DC
  Administered 2019-01-13 – 2019-01-15 (×5): 5000 [IU] via SUBCUTANEOUS
  Filled 2019-01-13 (×5): qty 1

## 2019-01-13 NOTE — ED Notes (Signed)
Wife Carder Yin would like an update [phone]

## 2019-01-13 NOTE — H&P (Signed)
NAME:  Gregory [name], MRN:  [id number], DOB:  [birth year], LOS: 0 ADMISSION DATE:  01/01/2019, CONSULTATION DATE:  3/24 REFERRING MD:  Dr. Sabra Heck, CHIEF COMPLAINT:  PEA   Brief History   85 year old man presented to [facility] ED after PEA arrest while eating lunch and likely aspirating.  History of present illness   85 year old male with past medical history as below, which is significant for congestive heart failure, diabetes mellitus, ischemic cardiomyopathy, and myocardial infarction.  He resides in a skilled nursing facility with his wife where he has lived since May 2019 after falling and breaking his hip.  Recent cardiology notes that he reported doing well and was seeing some improvement with physical therapy.  On 3/24 he and his wife were eating lunch when he appeared to choke and subsequently became unresponsive.  EMS was called and they responded within 15 minutes.  He was found to be in PEA and had [place] after 1 mg of epinephrine was given in conjunction with CPR.  He again suffered a brief cardiac arrest which resolved with 1 dose of epinephrine and CPR.  He was transported to [facility] emergency department where he was found to have copious respiratory secretions.  Remained hemodynamically stable in the emergency department.  PCCM was asked to admit.  Family tells me he now works very little with PT and barely gets out of bed anymore.   Past Medical History   has a past medical history of Abnormality of gait (09/19/2016), Allergy, Cataract, CHF (congestive heart failure) ([place]), Colon polyps, Diabetes mellitus, Diabetic neuropathy ([place]), Diverticulosis, Dyslipidemia, Essential hypertension, Glaucoma, Hyperlipidemia, Hypothyroidism, ICD (implantable cardiac defibrillator) in place (11-2004), Internal hemorrhoid, Ischemic cardiomyopathy, Ischemic heart disease, and Myocardial infarction ([place]) (2006).   Significant Hospital Events   3/24 admit for cardiac arrest  Consults:     Procedures:  ETT 3/24 > RIJ CVL 3/24 >  Significant Diagnostic Tests:  EEG 3/24 > Echo 3/24 >  Micro Data:  Blood cx 3/24 > Tracheal asp 3/24 >  Antimicrobials:  Vancomycin 3/24 > Cefepime 3/24 >  Interim history/subjective:    Objective   Height 5\' 11"  (1.803 m).    Vent Mode: PRVC FiO2 (%):  [100 %] 100 % Set Rate:  [18 bmp] 18 bmp Vt Set:  [600 mL] 600 mL PEEP:  [5 cmH20] 5 cmH20 Plateau Pressure:  [22 cmH20] 22 cmH20  No intake or output data in the 24 hours ending 01/14/2019 1550 There were no vitals filed for this visit.  Examination: General: elderly male on vent HENT: [place]/AT, L eye only remains and pupil is minimaly responsive. Dressing over R orbit.  Lungs: Coarse bilateral  Cardiovascular: RRR, no MRG Abdomen: Soft, non-distended Extremities: Wounds to R great toe and heel.  Neuro: Unresponsive. Minimal cough/gag. GU: Chronic foley.   Resolved Hospital Problem list     Assessment & Plan:   Cardiac arrest: secondary to food aspiration and hypoxia. PEA arrest. Rather brief once CPR initiated, but sounds like up to 15 mins of downtime without resuscitative efforts.  - Admit to ICU - Not a candidate for TTM therapy - Avoid fever with PRN tylenol and bair hugger if needed.  - EEG   Aspiration event: - empiric antibiotics - Follow cultures as above - May need bronchoscopy if oxygenation issues evolve.   Inability to protect airway s/p cardiac arrest - Full vent support - ABG reviewed and settings adjusted - VAP bundle - Follow CXR, ABG  History of ventricular  arrythmia - continue amiodarone - ICD in place.   Shock: improved. Now off pressors - telemetry monitoring - Gentle IVF  Chronic systolic CHF: secondary to ischemic cardiomyopathy. Most recent echo in 2015 with EF 25-30% - Echo  Hypothyroid - TSH - Resume home synthroid  DM - CBG monitoring and SSI q 4 hours PRN  Chronic foley - assess UA, culture  Best practice:  Diet: NPO  Pain/Anxiety/Delirium protocol (if indicated): Fentanyl infusion VAP protocol (if indicated): Yes DVT prophylaxis: heparin SQ GI prophylaxis: Pepcid Glucose control: CBG monitoring and SSI Mobility: BR Code Status: DNR Family Communication: Long discussion with Niece Leafy Ro who is also POA. She elects to pursue DNR should he arrest again, but would be ok with low dose pressors if able to buy [place] time to evaluate his neurologic recovery. If pressors escalating would likely want to consider comfort measures.  Disposition: ICU  Labs   CBC: Recent Labs  Lab 01/06/2019 1452 01/02/2019 1544  WBC 20.3*  --   NEUTROABS 12.2*  --   HGB 11.5* 12.2*  HCT 38.5* 36.0*  MCV 100.5*  --   PLT 283  --     Basic Metabolic Panel: Recent Labs  Lab 12/29/2018 1452 01/01/2019 1500 01/19/2019 1544  NA 133*  --  133*  K 3.5  --  3.1*  CL 98  --   --   CO2 27  --   --   GLUCOSE 230*  --   --   BUN 14  --   --   CREATININE 1.01 0.80  --   CALCIUM 8.0*  --   --    GFR: CrCl cannot be calculated (Unknown ideal weight.). Recent Labs  Lab 12/23/2018 1452  WBC 20.3*    Liver Function Tests: Recent Labs  Lab 12/26/2018 1452  AST 55*  ALT 28  ALKPHOS 135*  BILITOT 0.8  PROT 5.8*  ALBUMIN 1.7*   No results for input(s): LIPASE, AMYLASE in the last 168 hours. No results for input(s): AMMONIA in the last 168 hours.  ABG    Component Value Date/Time   PHART 7.591 (H) 12/28/2018 1544   PCO2ART 30.8 (L) 01/01/2019 1544   PO2ART 406.0 (H) 12/29/2018 1544   HCO3 29.6 (H) 12/21/2018 1544   TCO2 31 12/29/2018 1544   O2SAT 100.0 01/10/2019 1544     Coagulation Profile: Recent Labs  Lab 01/20/2019 1452  INR 1.5*    Cardiac Enzymes: No results for input(s): CKTOTAL, CKMB, CKMBINDEX, TROPONINI in the last 168 hours.  HbA1C: Hgb A1c MFr Bld  Date/Time Value Ref Range Status  12/31/2011 12:20 PM 9.3 (H) 4.6 - 6.5 % Final    Comment:    Glycemic Control Guidelines for People with Diabetes:Non  Diabetic:  <6%Goal of Therapy: <7%Additional Action Suggested:  >8%     CBG: No results for input(s): GLUCAP in the last 168 hours.  Review of Systems:   Unable as patient is encephalopathic and intubated.   Past Medical History  He,  has a past medical history of Abnormality of gait (09/19/2016), Allergy, Cataract, CHF (congestive heart failure) ([place]), Colon polyps, Diabetes mellitus, Diabetic neuropathy ([place]), Diverticulosis, Dyslipidemia, Essential hypertension, Glaucoma, Hyperlipidemia, Hypothyroidism, ICD (implantable cardiac defibrillator) in place (11-2004), Internal hemorrhoid, Ischemic cardiomyopathy, Ischemic heart disease, and Myocardial infarction ([place]) (2006).   Surgical History    Past Surgical History:  Procedure Laterality Date  . CARDIAC DEFIBRILLATOR PLACEMENT    . CATARACT EXTRACTION     left  . CHOLECYSTECTOMY    .  CHOLECYSTECTOMY, LAPAROSCOPIC    . COLONOSCOPY N/A 12/01/2012   Procedure: COLONOSCOPY;  Surgeon: Irene Shipper, MD;  Location: [facility] ENDOSCOPY;  Service: Endoscopy;  Laterality: N/A;  . CORONARY ARTERY BYPASS GRAFT  12/02/04  . ESOPHAGOGASTRODUODENOSCOPY N/A 12/01/2012   Procedure: ESOPHAGOGASTRODUODENOSCOPY (EGD);  Surgeon: Irene Shipper, MD;  Location: [facility];  Service: Endoscopy;  Laterality: N/A;  . 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 IMPLANTABLE CARDIOVERTER DEFIBRILLATOR (ICD) GENERATOR CHANGE N/A 03/11/2014   Procedure: ICD GENERATOR CHANGE;  Surgeon: Evans Lance, MD;  Location[facility] C[facility] LAB;  Service: Cardiovascular;  Laterality: N/A;  . INTRAMEDULLARY (IM) NAIL INTERTROCHANTERIC Right 08/01/2018   Procedure: INTRAMEDULLARY (IM) NAIL INTERTROCHANTRIC right hip;  Surgeon: Shona Needles, MD;  Location: [place];  Service: Orthopedics;  Laterality: Right;  . SHOULDER SURGERY     RIGHT  . TONSILLECTOMY       Social History   reports that he quit smoking about 14 years ago. His smoking use included cigarettes. He has never  used smokeless tobacco. He reports that he does not drink alcohol or use drugs.   Family History   His family history includes Cancer in his daughter; Diabetes in his mother and unknown relative; Prostate cancer in his father. There is no history of Colon cancer, Esophageal cancer, Rectal cancer, or Stomach cancer.   Allergies No Known Allergies   Home Medications  Prior to Admission medications   Medication Sig Start Date End Date Taking? Authorizing Provider  acetaminophen (TYLENOL) 500 MG tablet Take 500 mg by mouth every 6 (six) hours as needed. For pain    [provider]  amiodarone (PACERONE) 100 MG tablet Take 1 tablet (100 mg total) by mouth every morning. 09/29/18   Josue Hector, MD  aspirin EC 325 MG tablet Take 325 mg by mouth every morning.    [provider]  atorvastatin (LIPITOR) 20 MG tablet Take 1 tablet (20 mg total) by mouth daily. 07/23/16   Josue Hector, MD  calcium carbonate (TUMS EX) 750 MG chewable tablet Chew 1 tablet by mouth daily.     [provider]  cetirizine (ZYRTEC) 10 MG tablet Take 10 mg by mouth daily.    [provider]  cholecalciferol (VITAMIN D) 1000 UNITS tablet Take 1,000 Units by mouth daily.    [provider]  dorzolamide-timolol (COSOPT) 22.3-6.8 MG/ML ophthalmic solution Place 1 drop into the left eye daily.    [provider]  enoxaparin (LOVENOX) 40 MG/0.4ML injection Inject 0.4 mLs (40 mg total) into the skin daily. For 30 days from 08/01/2018. 08/11/18   Modena Jansky, MD  feeding supplement, ENSURE ENLIVE, (ENSURE ENLIVE) LIQD Take 237 mLs by mouth 2 (two) times daily between meals. 08/11/18   Hongalgi, Lenis Dickinson, MD  ferrous sulfate 325 (65 FE) MG tablet Take 325 mg by mouth daily with breakfast.    [provider]  gabapentin (NEURONTIN) 300 MG capsule Take 300 mg by mouth 3 (three) times daily.     [provider]  guaiFENesin (MUCINEX) 600 MG 12 hr tablet Take  2 tablets (1,200 mg total) by mouth 2 (two) times daily. 08/10/18   Hongalgi, Lenis Dickinson, MD  insulin aspart (NOVOLOG) 100 UNIT/ML injection Inject 0-9 Units into the skin 3 (three) times daily with meals. CBG < 70: implement hypoglycemia protocol CBG 70 - 120: 0 units CBG 121 - 150: 1 unit CBG 151 - 200: 2 units  CBG 201 - 250: 3 units CBG 251 - 300: 5 units CBG 301 - 350: 7 units CBG 351 - 400: 9 units CBG > 400: call MD. 08/11/18   Modena Jansky, MD  Inulin (METAMUCIL CLEAR & NATURAL) POWD Take 15 mLs by mouth daily as needed (fiber).  05/18/13   Darlin Coco, MD  latanoprost (XALATAN) 0.005 % ophthalmic solution Place 1 drop into the left eye at bedtime.    [provider]  Multiple Vitamins-Minerals (CENTRUM SILVER PO) Take 1 tablet by mouth daily.     [provider]  nitroGLYCERIN (NITROSTAT) 0.4 MG SL tablet Place 1 tablet (0.4 mg total) under the tongue every 5 (five) minutes as needed for chest pain. 05/04/16   Evans Lance, MD  SYNTHROID 137 MCG tablet Take 137 mcg by mouth daily. 10/15/15   [provider]  tamsulosin (FLOMAX) 0.4 MG CAPS capsule Take 0.4 mg by mouth daily after breakfast.  02/02/14   [provider]     Critical care time: 50 mins     Georgann Housekeeper, AGACNP-BC [place] Pager [phone] or [phone]  01/19/2019 4:55 PM

## 2019-01-13 NOTE — Progress Notes (Signed)
   01/14/2019 1700  Clinical Encounter Type  Visited With Patient  Visit Type Initial  Referral From Nurse  Consult/Referral To Chaplain  Spiritual Encounters  Spiritual Needs Emotional;Prayer  Stress Factors  Patient Stress Factors None identified   Responded to page for a spiritual care consult from [facility] who received message from Reginold Agent [phone].  Nurse in ED also stated that PT's Daughter Alton Revere) [phone] also requested a status update for PT. As I entered room PT was unresponsive. I offered spiritual care with PT with ministry of presence and silent prayer. PT's Nurse gave me information on what PT updates are.  I called both the [facility] and the Daughter for an update on Pt. Daughter was very thankful for the chaplain presence and contact for the PT. Chaplain available as needed. Chaplain Fidel Levy  [phone]

## 2019-01-13 NOTE — ED Notes (Signed)
Gregory [name](Sister's # [phone] ([phone])-called for update/would like called back.

## 2019-01-13 NOTE — ED Notes (Signed)
Vaughan Basta Osborne(Sister's # [phone] ([phone])-called for update/would like called back.

## 2019-01-13 NOTE — ED Notes (Signed)
ED TO INPATIENT HANDOFF REPORT  ED Nurse Name and Phone #: Selinda Eon, RN  S Name/Age/Gender Gregory [name] 85 y.o. male Room/Bed: TRACC/TRACC  Code Status   Code Status: DNR  Home/SNF/Other Home Orientation: intubated  Triage Complete: Triage complete  Chief Complaint Post Arrest  Triage Note CPR at facility. Choking, went unresponsive. <15 min down time before CPR start at facility. Epi x2 given by EMS, ROSC PTA. Per EMS, Pt was alert and eating lunch with wife who is also a pt at same facility. Pt began choking, wife notified staff, eMS called thereafter. Pt was unresponsive upon arrival to ED; strong femoral pulses present. #7 ET tube in place, placed by EMS. Ventilations via BVM upon arrival.    Allergies No Known Allergies  Level of Care/Admitting Diagnosis ED Disposition    ED Disposition Condition [facility] Area: [place] [[id number]]  Level of Care: ICU [6]  Diagnosis: Cardiac arrest ([place]) [427.5.ICD-9-CM]  Admitting Physician: Kipp Brood [[id number]]  Attending Physician: Kipp Brood [[id number]]  Estimated length of stay: 3 - 4 days  Certification:: I certify this patient will need inpatient services for at least 2 midnights  PT Class (Do Not Modify): Inpatient [101]  PT Acc Code (Do Not Modify): Private [1]       B Medical/Surgery History Past Medical History:  Diagnosis Date  . Abnormality of gait 09/19/2016  . Allergy   . Cataract   . CHF (congestive heart failure) ([place])   . Colon polyps   . Diabetes mellitus   . Diabetic neuropathy ([place])   . Diverticulosis   . Dyslipidemia   . Essential hypertension   . Glaucoma   . Hyperlipidemia   . Hypothyroidism   . ICD (implantable cardiac defibrillator) in place 11-2004  . Internal hemorrhoid   . Ischemic cardiomyopathy   . Ischemic heart disease   . Myocardial infarction [facility]) 2006   LARGE ANTERIOR WALL   Past Surgical History:  Procedure Laterality Date  .  CARDIAC DEFIBRILLATOR PLACEMENT    . CATARACT EXTRACTION     left  . CHOLECYSTECTOMY    . CHOLECYSTECTOMY, LAPAROSCOPIC    . COLONOSCOPY N/A 12/01/2012   Procedure: COLONOSCOPY;  Surgeon: Irene Shipper, MD;  Location: [facility];  Service: Endoscopy;  Laterality: N/A;  . CORONARY ARTERY BYPASS GRAFT  12/02/04  . ESOPHAGOGASTRODUODENOSCOPY N/A 12/01/2012   Procedure: ESOPHAGOGASTRODUODENOSCOPY (EGD);  Surgeon: Irene Shipper, MD;  Location: [facility];  Service: Endoscopy;  Laterality: N/A;  . 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 IMPLANTABLE CARDIOVERTER DEFIBRILLATOR (ICD) GENERATOR CHANGE N/A 03/11/2014   Procedure: ICD GENERATOR CHANGE;  Surgeon: Evans Lance, MD;  Location: [facility] CATH LAB;  Service: Cardiovascular;  Laterality: N/A;  . INTRAMEDULLARY (IM) NAIL INTERTROCHANTERIC Right 08/01/2018   Procedure: INTRAMEDULLARY (IM) NAIL INTERTROCHANTRIC right hip;  Surgeon: Shona Needles, MD;  Location: [place];  Service: Orthopedics;  Laterality: Right;  . SHOULDER SURGERY     RIGHT  . TONSILLECTOMY       A IV Location/Drains/Wounds Patient Lines/Drains/Airways Status   Active Line/Drains/Airways    Name:   Placement date:   Placement time:   Site:   Days:   Peripheral IV 01/01/2019 Anterior;Left Forearm   12/29/2018    1349    Forearm   less than 1   CVC Triple Lumen 12/27/2018 Internal jugular   01/18/2019    1440     less than  1   NG/OG Tube Orogastric 18 Fr. Left nare Xray   01/18/2019    1447    Left nare   less than 1   Urethral Catheter Douglass Rivers, RN   08/04/18    1235    -   162   Airway 7.5 mm   12/31/2018    1453     less than 1   Incision (Closed) 08/01/18 Hip Right   08/01/18    1225     165   Incision (Closed) 08/01/18 Hip Right   08/01/18    1229     165          Intake/Output Last 24 hours No intake or output data in the 24 hours ending 12/29/2018 1752  Labs/Imaging Results for orders placed or performed during the hospital encounter of 01/18/2019 (from  the past 48 hour(s))  Protime-INR     Status: Abnormal   Collection Time: 01/08/2019  2:52 PM  Result Value Ref Range   Prothrombin Time 17.8 (H) 11.4 - 15.2 seconds   INR 1.5 (H) 0.8 - 1.2    Comment: (NOTE) INR goal varies based on device and disease states. Performed at [facility], [place] [address], [place], [place] [address]   APTT     Status: Abnormal   Collection Time: 01/06/2019  2:52 PM  Result Value Ref Range   aPTT 38 (H) 24 - 36 seconds    Comment:        IF BASELINE aPTT IS ELEVATED, SUGGEST PATIENT RISK ASSESSMENT BE USED TO DETERMINE APPROPRIATE ANTICOAGULANT THERAPY. Performed at [facility], [place] [address] [place], [place] [address]   CBC WITH DIFFERENTIAL     Status: Abnormal   Collection Time: 01/05/2019  2:52 PM  Result Value Ref Range   WBC 20.3 (H) 4.0 - 10.5 K/uL   RBC 3.83 (L) 4.22 - 5.81 MIL/uL   Hemoglobin 11.5 (L) 13.0 - 17.0 g/dL   HCT 38.5 (L) 39.0 - 52.0 %   MCV 100.5 (H) 80.0 - 100.0 fL   MCH 30.0 26.0 - 34.0 pg   MCHC 29.9 (L) 30.0 - 36.0 g/dL   RDW 14.0 11.5 - 15.5 %   Platelets 283 150 - 400 K/uL   nRBC 0.0 0.0 - 0.2 %   Neutrophils Relative % 60 %   Neutro Abs 12.2 (H) 1.7 - 7.7 K/uL   Lymphocytes Relative 29 %   Lymphs Abs 5.9 (H) 0.7 - 4.0 K/uL   Monocytes Relative 8 %   Monocytes Absolute 1.6 (H) 0.1 - 1.0 K/uL   Eosinophils Relative 3 %   Eosinophils Absolute 0.6 (H) 0.0 - 0.5 K/uL   Basophils Relative 0 %   Basophils Absolute 0.0 0.0 - 0.1 K/uL   WBC Morphology See Note     Comment: >10% reactive, Benign Lymphocytes.   nRBC 0 0 /100 WBC   Abs Immature Granulocytes 0.00 0.00 - 0.07 K/uL   Polychromasia PRESENT     Comment: Performed at [facility], [place] [address] [place], [place] [address]  Comprehensive metabolic panel     Status: Abnormal   Collection Time: 01/14/2019  2:52 PM  Result Value Ref Range   Sodium 133 (L) 135 - 145 mmol/L   Potassium 3.5 3.5 - 5.1 mmol/L   Chloride 98 98 - 111 mmol/L   CO2 27 22  - 32 mmol/L   Glucose, Bld 230 (H) 70 - 99 mg/dL  BUN 14 8 - 23 mg/dL   Creatinine, Ser 1.01 0.61 - 1.24 mg/dL   Calcium 8.0 (L) 8.9 - 10.3 mg/dL   Total Protein 5.8 (L) 6.5 - 8.1 g/dL   Albumin 1.7 (L) 3.5 - 5.0 g/dL   AST 55 (H) 15 - 41 U/L   ALT 28 0 - 44 U/L   Alkaline Phosphatase 135 (H) 38 - 126 U/L   Total Bilirubin 0.8 0.3 - 1.2 mg/dL   GFR calc non Af Amer >60 >60 mL/min   GFR calc Af Amer >60 >60 mL/min   Anion gap 8 5 - 15    Comment: Performed at [facility] [address] [place], [place] [address]  I-stat Creatinine, ED     Status: None   Collection Time: 01/01/2019  3:00 PM  Result Value Ref Range   Creatinine, Ser 0.80 0.61 - 1.24 mg/dL  I-STAT 7, (LYTES, BLD GAS, ICA, H+H)     Status: Abnormal   Collection Time: 01/12/2019  3:44 PM  Result Value Ref Range   pH, Arterial 7.591 (H) 7.350 - 7.450   pCO2 arterial 30.8 (L) 32.0 - 48.0 mmHg   pO2, Arterial 406.0 (H) 83.0 - 108.0 mmHg   Bicarbonate 29.6 (H) 20.0 - 28.0 mmol/L   TCO2 31 22 - 32 mmol/L   O2 Saturation 100.0 %   Acid-Base Excess 8.0 (H) 0.0 - 2.0 mmol/L   Sodium 133 (L) 135 - 145 mmol/L   Potassium 3.1 (L) 3.5 - 5.1 mmol/L   Calcium, Ion 1.09 (L) 1.15 - 1.40 mmol/L   HCT 36.0 (L) 39.0 - 52.0 %   Hemoglobin 12.2 (L) 13.0 - 17.0 g/dL   Patient temperature HIDE    Sample type ARTERIAL   Urine rapid drug screen (hosp performed)     Status: None   Collection Time: 01/02/2019  3:57 PM  Result Value Ref Range   Opiates NONE DETECTED NONE DETECTED   Cocaine NONE DETECTED NONE DETECTED   Benzodiazepines NONE DETECTED NONE DETECTED   Amphetamines NONE DETECTED NONE DETECTED   Tetrahydrocannabinol NONE DETECTED NONE DETECTED   Barbiturates NONE DETECTED NONE DETECTED    Comment: (NOTE) DRUG SCREEN FOR MEDICAL PURPOSES ONLY.  IF CONFIRMATION IS NEEDED FOR ANY PURPOSE, NOTIFY LAB WITHIN 5 DAYS. LOWEST DETECTABLE LIMITS FOR URINE DRUG SCREEN Drug Class                     Cutoff (ng/mL) Amphetamine  and metabolites    1000 Barbiturate and metabolites    200 Benzodiazepine                 353 Tricyclics and metabolites     300 Opiates and metabolites        300 Cocaine and metabolites        300 THC                            50 Performed at [facility], [place] [address] [place], [place] [address]   Urinalysis, Routine w reflex microscopic     Status: Abnormal   Collection Time: 01/20/2019  3:57 PM  Result Value Ref Range   Color, Urine AMBER (A) YELLOW    Comment: BIOCHEMICALS MAY BE AFFECTED BY COLOR   APPearance CLOUDY (A) CLEAR   Specific Gravity, Urine 1.019 1.005 - 1.030   pH 6.0 5.0 - 8.0   Glucose, UA NEGATIVE  NEGATIVE mg/dL   Hgb urine dipstick SMALL (A) NEGATIVE   Bilirubin Urine NEGATIVE NEGATIVE   Ketones, ur NEGATIVE NEGATIVE mg/dL   Protein, ur 100 (A) NEGATIVE mg/dL   Nitrite NEGATIVE NEGATIVE   Leukocytes,Ua LARGE (A) NEGATIVE   RBC / HPF 21-50 0 - 5 RBC/hpf   WBC, UA >50 (H) 0 - 5 WBC/hpf   Bacteria, UA RARE (A) NONE SEEN   Squamous Epithelial / LPF 0-5 0 - 5   WBC Clumps PRESENT    Mucus PRESENT    Hyaline Casts, UA PRESENT     Comment: Performed at [facility] [address] [place], [place] [address]   Dg Chest Port 1 View  Result Date: 01/09/2019 CLINICAL DATA:  Endotracheal tube placement. History of ischemic cardiomyopathy, hypertension and diabetes with CHF. Query aspiration. EXAM: PORTABLE CHEST 1 VIEW COMPARISON:  08/10/2018 FINDINGS: Stable cardiomegaly with aortic atherosclerosis. Endotracheal tube tip terminates 3.7 cm above the carina in satisfactory position. Right IJ central line catheter extends into the right proximal atrium. AICD device projects over the left axilla with leads in the expected location the right ventricle. Gastric tube extends below the left hemidiaphragm. Relative hyperinflation of the right lung compared to left with left-sided volume loss, small left effusion and chronic coarsened interstitial markings noted.  No definite evidence of aspiration. IMPRESSION: 1. No apparent evidence of aspiration. 2. Support line and tube positions as above with endotracheal tube approximately 3.7 cm above the carina. 3. Stable cardiomegaly with aortic atherosclerosis. Electronically Signed   By: Ashley Royalty M.D.   On: 01/18/2019 15:25    Pending Labs Unresulted Labs (From admission, onward)    Start     Ordered   01/14/19 0500  CBC  Tomorrow morning,   R     01/14/2019 1636   01/14/19 3474  Basic metabolic panel  Tomorrow morning,   R     12/26/2018 1636   01/14/19 0500  Magnesium  Tomorrow morning,   R     01/20/2019 1636   01/14/19 0500  Phosphorus  Tomorrow morning,   R     12/25/2018 1636   12/23/2018 1800  Blood gas, arterial  Once,   R     01/02/2019 1636   01/11/2019 1636  Urine culture  Once,   R     12/24/2018 1636   01/12/2019 1635  Culture, respiratory (tracheal aspirate)  Once,   R     01/19/2019 1636   12/25/2018 1452  CBC  (Stroke Panel ([facility]))  ONCE - STAT,   STAT     12/25/2018 1453          Vitals/Pain Today's Vitals   01/14/2019 1530 12/24/2018 1545 12/30/2018 1600 01/16/2019 1630  BP: 120/67  118/66 124/63  Pulse: 73 76 78 73  Resp: 18 18 12 12   SpO2: 100% 100% 100% 99%  Height:        Isolation Precautions No active isolations  Medications Medications  fentaNYL 2588mcg in NS 230mL (38mcg/ml) infusion-PREMIX (25 mcg/hr Intravenous New Bag/Given 12/29/2018 1453)  fentaNYL (SUBLIMAZE) 100 MCG/2ML injection (0 mcg  Hold 01/20/2019 1546)  norepinephrine (LEVOPHED) 4-5 MG/250ML-% infusion SOLN (  Stopped 01/14/2019 1538)  midazolam (VERSED) 2 MG/2ML injection (has no administration in time range)  fentaNYL 2515mcg in NS 254mL (63mcg/ml) infusion-PREMIX (has no administration in time range)  heparin injection 5,000 Units (has no administration in time range)  famotidine (PEPCID) IVPB 20 mg in NS 100 mL IVPB (has no  administration in time range)  acetaminophen (TYLENOL) tablet 650 mg (has no administration in time range)   insulin aspart (novoLOG) injection 0-9 Units (has no administration in time range)  amiodarone (PACERONE) tablet 100 mg (has no administration in time range)  levothyroxine (SYNTHROID, LEVOTHROID) tablet 137 mcg (has no administration in time range)  midazolam (VERSED) injection 2 mg (2 mg Intravenous Given 12/24/2018 1452)  vancomycin (VANCOCIN) IVPB 1000 mg/200 mL premix (0 mg Intravenous Stopped 01/20/2019 1548)  piperacillin-tazobactam (ZOSYN) IVPB 3.375 g (0 g Intravenous Stopped 12/29/2018 1649)    Mobility      Focused Assessments Cardiac Assessment Handoff:    Lab Results  Component Value Date   CKTOTAL 282 (H) 11/26/2007   CKMB 3.3 11/26/2007   TROPONINI ([place]) 11/26/2007    0.61        POSSIBLE MYOCARDIAL ISCHEMIA. SERIAL TESTING RECOMMENDED. CRITICAL VALUE NOTED.  VALUE IS CONSISTENT WITH PREVIOUSLY REPORTED AND CALLED VALUE.   No results found for: DDIMER     R Recommendations: See Admitting Provider Note  Report given to:   Additional Notes:

## 2019-01-13 NOTE — ED Provider Notes (Signed)
[facility] EMERGENCY DEPARTMENT Provider Note   CSN: [id number] Arrival date & time: 01/10/2019  1424   History   Chief Complaint Chief Complaint  Patient presents with  . Cardiac Arrest    HPI Gregory [name] is a 85 y.o. male.     HPI  Patient is an 85 year old male, he has a known history of congestive heart failure as well as diabetes, hypertension, he has an implantable cardiac defibrillator and known ischemic heart disease.  Currently the patient is at [facility] according to the paramedic report, he is there in a nursing home facility.  The patient is not able to give [place] any information since he presents as a cardiac arrest.  Reportedly the patient's wife had been feeding him when all of a sudden he had difficulty breathing, it was suspected that he had aspirated and the nursing home was able to suction out large amounts of food, he was intubated on arrival by paramedics and CPR was initiated.  1 dose of epinephrine was given followed by return of spontaneous circulation from [facility].  He then went back into [facility] and a second dose of epinephrine was given followed by an epinephrine drip in the field.  This was successful in maintaining his circulation and he arrived to the hospital intubated with some discomfort with the endotracheal tube but not following purposeful movements, not following commands, not opening his eyes.  The patient is critically ill and a level 5 caveat applies.  Past Medical History:  Diagnosis Date  . Abnormality of gait 09/19/2016  . Allergy   . Cataract   . CHF (congestive heart failure) ([place])   . Colon polyps   . Diabetes mellitus   . Diabetic neuropathy ([place])   . Diverticulosis   . Dyslipidemia   . Essential hypertension   . Glaucoma   . Hyperlipidemia   . Hypothyroidism   . ICD (implantable cardiac defibrillator) in place 11-2004  . Internal hemorrhoid   . Ischemic cardiomyopathy   . Ischemic heart disease   . Myocardial  infarction [facility]) 2006   LARGE ANTERIOR WALL    Patient Active Problem List   Diagnosis Date Noted  . Malnutrition of moderate degree 08/06/2018  . Closed comminuted intertrochanteric fracture of proximal end of right femur ([place]) 08/01/2018  . Abnormality of gait 09/19/2016  . Unexplained night sweats 03/31/2015  . Constipation 05/18/2013  . Ischemic heart disease   . Myocardial infarction ([place])   . Hypothyroidism   . Diabetes mellitus ([place])   . Dyslipidemia   . Benign hypertensive heart disease without heart failure   . Ischemic cardiomyopathy   . Diabetic neuropathy ([employer])   . HYPOTHYROIDISM 03/15/2009  . DYSLIPIDEMIA 03/15/2009  . 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 Implantable cardioverter-defibrillator (ICD) in situ 03/15/2009  . DM 02/02/2008  . DEPRESSION 02/02/2008  . MI 02/02/2008  . CAD 02/02/2008  . CHF 02/02/2008    Past Surgical History:  Procedure Laterality Date  . CARDIAC DEFIBRILLATOR PLACEMENT    . CATARACT EXTRACTION     left  . CHOLECYSTECTOMY    . CHOLECYSTECTOMY, LAPAROSCOPIC    . COLONOSCOPY N/A 12/01/2012   Procedure: COLONOSCOPY;  Surgeon: Irene Shipper, MD;  Location: [facility] ENDOSCOPY;  Service: Endoscopy;  Laterality: N/A;  . CORONARY ARTERY BYPASS GRAFT  12/02/04  . ESOPHAGOGASTRODUODENOSCOPY N/A 12/01/2012   Procedure: ESOPHAGOGASTRODUODENOSCOPY (EGD);  Surgeon: Irene Shipper, MD;  Location: [facility];  Service: Endoscopy;  Laterality: N/A;  . EYE SURGERY  SOCKET SURGERY-BORN WITHOUT RIGHT EYE  . IMPLANTABLE CARDIOVERTER DEFIBRILLATOR (ICD) GENERATOR CHANGE N/A 03/11/2014   Procedure: ICD GENERATOR CHANGE;  Surgeon: Evans Lance, MD;  Location: [facility] CATH LAB;  Service: Cardiovascular;  Laterality: N/A;  . INTRAMEDULLARY (IM) NAIL INTERTROCHANTERIC Right 08/01/2018   Procedure: INTRAMEDULLARY (IM) NAIL INTERTROCHANTRIC right hip;  Surgeon: Shona Needles, MD;  Location: [place];  Service: Orthopedics;  Laterality: Right;  . SHOULDER SURGERY      RIGHT  . TONSILLECTOMY          Home Medications    Prior to Admission medications   Medication Sig Start Date End Date Taking? Authorizing Provider  acetaminophen (TYLENOL) 500 MG tablet Take 500 mg by mouth every 6 (six) hours as needed. For pain    [provider]  amiodarone (PACERONE) 100 MG tablet Take 1 tablet (100 mg total) by mouth every morning. 09/29/18   Josue Hector, MD  aspirin EC 325 MG tablet Take 325 mg by mouth every morning.    [provider]  atorvastatin (LIPITOR) 20 MG tablet Take 1 tablet (20 mg total) by mouth daily. 07/23/16   Josue Hector, MD  calcium carbonate (TUMS EX) 750 MG chewable tablet Chew 1 tablet by mouth daily.     [provider]  cetirizine (ZYRTEC) 10 MG tablet Take 10 mg by mouth daily.    [provider]  cholecalciferol (VITAMIN D) 1000 UNITS tablet Take 1,000 Units by mouth daily.    [provider]  dorzolamide-timolol (COSOPT) 22.3-6.8 MG/ML ophthalmic solution Place 1 drop into the left eye daily.    [provider]  enoxaparin (LOVENOX) 40 MG/0.4ML injection Inject 0.4 mLs (40 mg total) into the skin daily. For 30 days from 08/01/2018. 08/11/18   Modena Jansky, MD  feeding supplement, ENSURE ENLIVE, (ENSURE ENLIVE) LIQD Take 237 mLs by mouth 2 (two) times daily between meals. 08/11/18   Hongalgi, Lenis Dickinson, MD  ferrous sulfate 325 (65 FE) MG tablet Take 325 mg by mouth daily with breakfast.    [provider]  gabapentin (NEURONTIN) 300 MG capsule Take 300 mg by mouth 3 (three) times daily.     [provider]  guaiFENesin (MUCINEX) 600 MG 12 hr tablet Take 2 tablets (1,200 mg total) by mouth 2 (two) times daily. 08/10/18   Hongalgi, Lenis Dickinson, MD  insulin aspart (NOVOLOG) 100 UNIT/ML injection Inject 0-9 Units into the skin 3 (three) times daily with meals. CBG < 70: implement hypoglycemia protocol CBG 70 - 120: 0 units CBG 121 - 150: 1 unit CBG 151 - 200: 2  units CBG 201 - 250: 3 units CBG 251 - 300: 5 units CBG 301 - 350: 7 units CBG 351 - 400: 9 units CBG > 400: call MD. 08/11/18   Modena Jansky, MD  Inulin (METAMUCIL CLEAR & NATURAL) POWD Take 15 mLs by mouth daily as needed (fiber).  05/18/13   Darlin Coco, MD  latanoprost (XALATAN) 0.005 % ophthalmic solution Place 1 drop into the left eye at bedtime.    [provider]  Multiple Vitamins-Minerals (CENTRUM SILVER PO) Take 1 tablet by mouth daily.     [provider]  nitroGLYCERIN (NITROSTAT) 0.4 MG SL tablet Place 1 tablet (0.4 mg total) under the tongue every 5 (five) minutes as needed for chest pain. 05/04/16   Evans Lance, MD  SYNTHROID 137 MCG tablet Take 137 mcg by mouth daily. 10/15/15   [provider]  tamsulosin (FLOMAX) 0.4 MG CAPS capsule Take 0.4 mg by mouth daily after breakfast.  02/02/14   [provider]    Family History Family History  Problem Relation Age of Onset  . Diabetes Mother   . Prostate cancer Father   . Cancer Daughter        ? unknown, in stomach  . Diabetes Unknown   . Colon cancer Neg Hx   . Esophageal cancer Neg Hx   . Rectal cancer Neg Hx   . Stomach cancer Neg Hx     Social History Social History   Tobacco Use  . Smoking status: Former Smoker    Types: Cigarettes    Last attempt to quit: 10/22/2004    Years since quitting: 14.2  . Smokeless tobacco: Never Used  . Tobacco comment: quit smoking pipe/cigar in 2006  Substance Use Topics  . Alcohol use: No    Alcohol/week: 0.0 standard drinks  . Drug use: No     Allergies   Patient has no known allergies.   Review of Systems Review of Systems  Unable to perform ROS: Intubated     Physical Exam Updated Vital Signs Ht 1.803 m (5' 11" )   BMI 21.76 kg/m   Physical Exam Vitals signs and nursing note reviewed.  Constitutional:      General: He is in acute distress.     Appearance: He is well-developed. He is ill-appearing and  toxic-appearing.  HENT:     Head: Normocephalic and atraumatic.     Mouth/Throat:     Pharynx: No oropharyngeal exudate.  Eyes:     General: No scleral icterus.       Right eye: No discharge.        Left eye: No discharge.     Conjunctiva/sclera: Conjunctivae normal.     Pupils: Pupils are equal, round, and reactive to light.  Neck:     Musculoskeletal: Normal range of motion and neck supple.     Thyroid: No thyromegaly.     Vascular: No JVD.  Cardiovascular:     Rate and Rhythm: Regular rhythm. Tachycardia present.     Heart sounds: Normal heart sounds. No murmur. No friction rub. No gallop.   Pulmonary:     Effort: Respiratory distress present.     Breath sounds: Rhonchi and rales present. No wheezing.     Comments: Mechanically ventilated Abdominal:     General: Bowel sounds are normal. There is no distension.     Palpations: Abdomen is soft. There is no mass.     Tenderness: There is no abdominal tenderness.  Musculoskeletal:        General: No tenderness.     Comments: The patient has normal-appearing joints but has significant muscle wasting of his legs and arms, intraosseous access placed in the proximal tibia  Lymphadenopathy:     Cervical: No cervical adenopathy.  Skin:    General: Skin is warm and dry.     Findings: No erythema or rash.  Neurological:     Comments: The patient is unresponsive to painful stimuli, he appears to be trying to breathe over the vent just a little bit but is not having any response to painful stimuli.      ED Treatments / Results  Labs (all labs ordered are listed, but only abnormal results are displayed) Labs Reviewed  PROTIME-INR - Abnormal; Notable for the following components:      Result Value   Prothrombin Time 17.8 (*)  INR 1.5 (*)    All other components within normal limits  APTT - Abnormal; Notable for the following components:   aPTT 38 (*)    All other components within normal limits  CBC WITH DIFFERENTIAL/PLATELET  - Abnormal; Notable for the following components:   WBC 20.3 (*)    RBC 3.83 (*)    Hemoglobin 11.5 (*)    HCT 38.5 (*)    MCV 100.5 (*)    MCHC 29.9 (*)    Neutro Abs 12.2 (*)    Lymphs Abs 5.9 (*)    Monocytes Absolute 1.6 (*)    Eosinophils Absolute 0.6 (*)    All other components within normal limits  COMPREHENSIVE METABOLIC PANEL - Abnormal; Notable for the following components:   Sodium 133 (*)    Glucose, Bld 230 (*)    Calcium 8.0 (*)    Total Protein 5.8 (*)    Albumin 1.7 (*)    AST 55 (*)    Alkaline Phosphatase 135 (*)    All other components within normal limits  CBC  RAPID URINE DRUG SCREEN, HOSP PERFORMED  URINALYSIS, ROUTINE W REFLEX MICROSCOPIC  I-STAT CREATININE, ED    EKG None  Radiology Dg Chest Port 1 View  Result Date: 01/12/2019 CLINICAL DATA:  Endotracheal tube placement. History of ischemic cardiomyopathy, hypertension and diabetes with CHF. Query aspiration. EXAM: PORTABLE CHEST 1 VIEW COMPARISON:  08/10/2018 FINDINGS: Stable cardiomegaly with aortic atherosclerosis. Endotracheal tube tip terminates 3.7 cm above the carina in satisfactory position. Right IJ central line catheter extends into the right proximal atrium. AICD device projects over the left axilla with leads in the expected location the right ventricle. Gastric tube extends below the left hemidiaphragm. Relative hyperinflation of the right lung compared to left with left-sided volume loss, small left effusion and chronic coarsened interstitial markings noted. No definite evidence of aspiration. IMPRESSION: 1. No apparent evidence of aspiration. 2. Support line and tube positions as above with endotracheal tube approximately 3.7 cm above the carina. 3. Stable cardiomegaly with aortic atherosclerosis. Electronically Signed   By: Ashley Royalty M.D.   On: 01/04/2019 15:25    Procedures .Critical Care Performed by: Noemi Chapel, MD Authorized by: Noemi Chapel, MD   Critical care provider  statement:    Critical care time (minutes):  35   Critical care time was exclusive of:  Separately billable procedures and treating other patients and teaching time   Critical care was necessary to treat or prevent imminent or life-threatening deterioration of the following conditions:  Respiratory failure and cardiac failure   Critical care was time spent personally by me on the following activities:  Blood draw for specimens, development of treatment plan with patient or surrogate, discussions with consultants, evaluation of patient's response to treatment, examination of patient, obtaining history from patient or surrogate, ordering and performing treatments and interventions, ordering and review of laboratory studies, ordering and review of radiographic studies, pulse oximetry, re-evaluation of patient's condition and review of old charts Procedure Name: Intubation Date/Time: 01/18/2019 3:16 PM Performed by: Noemi Chapel, MD Pre-anesthesia Checklist: Patient identified, Patient being monitored, Emergency Drugs available, Timeout performed and Suction available Oxygen Delivery Method: Ambu bag Preoxygenation: Pre-oxygenation with 100% oxygen Ventilation: Mask ventilation without difficulty Laryngoscope Size: Mac and 3 Grade View: Grade III Tube size: 7.5 mm Number of attempts: 2 Intubation method: Direct laryngoscopy, MAC 4. Placement Confirmation: ETT inserted through vocal cords under direct vision,  CO2 detector and Breath sounds checked- equal and bilateral  Secured at: 23 cm Tube secured with: ETT holder Dental Injury: Teeth and Oropharynx as per pre-operative assessment  Difficulty Due To: Difficulty was anticipated    .Central Line Date/Time: 01/16/2019 3:18 PM Performed by: Noemi Chapel, MD Authorized by: Noemi Chapel, MD   Consent:    Consent obtained:  Emergent situation Pre-procedure details:    Hand hygiene: Hand hygiene performed prior to insertion     Sterile  barrier technique: All elements of maximal sterile technique followed     Skin preparation:  ChloraPrep   Skin preparation agent: Skin preparation agent completely dried prior to procedure   Anesthesia (see MAR for exact dosages):    Anesthesia method:  None Procedure details:    Location:  R internal jugular   Patient position:  Reverse Trendelenburg   Procedural supplies:  Triple lumen   Catheter size:  7 Fr   Landmarks identified: yes     Ultrasound guidance: yes     Sterile ultrasound techniques: Sterile gel and sterile probe covers were used     Number of attempts:  1   Successful placement: yes   Post-procedure details:    Post-procedure:  Dressing applied and line sutured   Assessment:  Blood return through all ports, no pneumothorax on x-ray, free fluid flow and placement verified by x-ray   Patient tolerance of procedure:  Tolerated well, no immediate complications   (including critical care time)  Medications Ordered in ED Medications  fentaNYL 2530mg in NS 2554m(1056mml) infusion-PREMIX (25 mcg/hr Intravenous New Bag/Given 01/16/2019 1453)  fentaNYL (SUBLIMAZE) 100 MCG/2ML injection (has no administration in time range)  norepinephrine (LEVOPHED) 4-5 MG/250ML-% infusion SOLN (  Stopped 01/12/2019 1538)  midazolam (VERSED) 2 MG/2ML injection (has no administration in time range)  fentaNYL 2500m70mn NS 250mL48mmcg74m infusion-PREMIX (has no administration in time range)  vancomycin (VANCOCIN) IVPB 1000 mg/200 mL premix (has no administration in time range)  piperacillin-tazobactam (ZOSYN) IVPB 3.375 g (has no administration in time range)  midazolam (VERSED) injection 2 mg (2 mg Intravenous Given 12/31/2018 1452)     Initial Impression / Assessment and Plan / ED Course  I have reviewed the triage vital signs and the nursing notes.  Pertinent labs & imaging results that were available during my care of the patient were reviewed by me and considered in my medical decision  making (see chart for details).       The patient is critically ill, he is having copious amounts of purulent material suctioned out of his endotracheal tube.  He is oxygenating adequately and has good pulses with a blood pressure over 100 [id number].  I suspect that he aspirated, due to poor access I placed a central line on arrival under sterile conditions, please see separate note.  Discussion will be had with intensive care unit physician for admission.  Suspect that this was a primary respiratory arrest secondary to aspiration.  Currently he seems to be doing better and has returned spontaneous circulation.  The patient had a very small 7 French endotracheal tube which needed to be replaced for a larger tube to facilitate possible bronchoscopy and to better oxygenate given the amount of material in the tube.  It was replaced with repeat intubation, this was not successful over a bougie secondary to the patient's anatomy, and had to be redone from scratch.  The patient tolerated this well.  Critical Care notified, the pt has done well on fentanyl Levophed has been stopped and he is holding his pressure  Antibioitics started, the patient will go to the intensive care unit.  I personally looked at the chest x-ray and find there appears to be no signs of obvious pneumonia, based on what is being suctioned out of the tube I suspect there was significant aspiration.  Final Clinical Impressions(s) / ED Diagnoses   Final diagnoses:  Cardiac arrest ([place])  Respiratory arrest ([place])  Aspiration pneumonia, unspecified aspiration pneumonia type, unspecified laterality, unspecified part of lung ([facility])      Noemi Chapel, MD 01/12/2019 1540

## 2019-01-13 NOTE — ED Triage Notes (Addendum)
CPR at facility. Choking, went unresponsive. <15 min down time before CPR start at facility. Epi x2 given by EMS, ROSC PTA. Per EMS, Pt was alert and eating lunch with wife who is also a pt at same facility. Pt began choking, wife notified staff, eMS called thereafter. Pt was unresponsive upon arrival to ED; strong femoral pulses present. #7 ET tube in place, placed by EMS. Ventilations via BVM upon arrival.

## 2019-01-14 ENCOUNTER — Inpatient Hospital Stay (HOSPITAL_COMMUNITY): Payer: PPO

## 2019-01-14 DIAGNOSIS — I469 Cardiac arrest, cause unspecified: Secondary | ICD-10-CM

## 2019-01-14 DIAGNOSIS — J9601 Acute respiratory failure with hypoxia: Secondary | ICD-10-CM

## 2019-01-14 DIAGNOSIS — Z515 Encounter for palliative care: Secondary | ICD-10-CM

## 2019-01-14 DIAGNOSIS — G931 Anoxic brain damage, not elsewhere classified: Secondary | ICD-10-CM

## 2019-01-14 DIAGNOSIS — I340 Nonrheumatic mitral (valve) insufficiency: Secondary | ICD-10-CM

## 2019-01-14 DIAGNOSIS — I361 Nonrheumatic tricuspid (valve) insufficiency: Secondary | ICD-10-CM

## 2019-01-14 DIAGNOSIS — L8990 Pressure ulcer of unspecified site, unspecified stage: Secondary | ICD-10-CM

## 2019-01-14 LAB — POCT I-STAT 7, (LYTES, BLD GAS, ICA,H+H)
Acid-Base Excess: 6 mmol/L — ABNORMAL HIGH (ref 0.0–2.0)
Acid-Base Excess: 6 mmol/L — ABNORMAL HIGH (ref 0.0–2.0)
Bicarbonate: 27.2 mmol/L (ref 20.0–28.0)
Bicarbonate: 27.9 mmol/L (ref 20.0–28.0)
CALCIUM ION: 1.1 mmol/L — AB (ref 1.15–1.40)
Calcium, Ion: 1.05 mmol/L — ABNORMAL LOW (ref 1.15–1.40)
HCT: 36 % — ABNORMAL LOW (ref 39.0–52.0)
HCT: 33 % — ABNORMAL LOW (ref 39.0–52.0)
Hemoglobin: 12.2 g/dL — ABNORMAL LOW (ref 13.0–17.0)
Hemoglobin: 11.2 g/dL — ABNORMAL LOW (ref 13.0–17.0)
O2 SAT: 100 %
O2 Saturation: 100 %
Patient temperature: 97.6
Patient temperature: 98.5
Potassium: 3.8 mmol/L (ref 3.5–5.1)
Potassium: 4 mmol/L (ref 3.5–5.1)
Sodium: 131 mmol/L — ABNORMAL LOW (ref 135–145)
Sodium: 128 mmol/L — ABNORMAL LOW (ref 135–145)
TCO2: 28 mmol/L (ref 22–32)
TCO2: 29 mmol/L (ref 22–32)
pCO2 arterial: 27.2 mmHg — ABNORMAL LOW (ref 32.0–48.0)
pCO2 arterial: 29.5 mmHg — ABNORMAL LOW (ref 32.0–48.0)
pH, Arterial: 7.607 (ref 7.350–7.450)
pH, Arterial: 7.583 — ABNORMAL HIGH (ref 7.350–7.450)
pO2, Arterial: 170 mmHg — ABNORMAL HIGH (ref 83.0–108.0)
pO2, Arterial: 179 mmHg — ABNORMAL HIGH (ref 83.0–108.0)

## 2019-01-14 LAB — CBC
HCT: 36 % — ABNORMAL LOW (ref 39.0–52.0)
Hemoglobin: 11.8 g/dL — ABNORMAL LOW (ref 13.0–17.0)
MCH: 30.1 pg (ref 26.0–34.0)
MCHC: 32.8 g/dL (ref 30.0–36.0)
MCV: 91.8 fL (ref 80.0–100.0)
PLATELETS: 255 10*3/uL (ref 150–400)
RBC: 3.92 MIL/uL — ABNORMAL LOW (ref 4.22–5.81)
RDW: 13.7 % (ref 11.5–15.5)
WBC: 16.4 10*3/uL — ABNORMAL HIGH (ref 4.0–10.5)
nRBC: 0 % (ref 0.0–0.2)

## 2019-01-14 LAB — GLUCOSE, CAPILLARY
Glucose-Capillary: 101 mg/dL — ABNORMAL HIGH (ref 70–99)
Glucose-Capillary: 124 mg/dL — ABNORMAL HIGH (ref 70–99)
Glucose-Capillary: 130 mg/dL — ABNORMAL HIGH (ref 70–99)
Glucose-Capillary: 130 mg/dL — ABNORMAL HIGH (ref 70–99)
Glucose-Capillary: 138 mg/dL — ABNORMAL HIGH (ref 70–99)
Glucose-Capillary: 157 mg/dL — ABNORMAL HIGH (ref 70–99)

## 2019-01-14 LAB — MRSA PCR SCREENING: MRSA by PCR: NEGATIVE

## 2019-01-14 LAB — PHOSPHORUS: Phosphorus: 1.4 mg/dL — ABNORMAL LOW (ref 2.5–4.6)

## 2019-01-14 LAB — BASIC METABOLIC PANEL
Anion gap: 12 (ref 5–15)
BUN: 19 mg/dL (ref 8–23)
CO2: 27 mmol/L (ref 22–32)
Calcium: 8.3 mg/dL — ABNORMAL LOW (ref 8.9–10.3)
Chloride: 95 mmol/L — ABNORMAL LOW (ref 98–111)
Creatinine, Ser: 0.88 mg/dL (ref 0.61–1.24)
GFR calc Af Amer: >60 mL/min (ref >60)
Glucose, Bld: 143 mg/dL — ABNORMAL HIGH (ref 70–99)
Potassium: 3.2 mmol/L — ABNORMAL LOW (ref 3.5–5.1)
Sodium: 134 mmol/L — ABNORMAL LOW (ref 135–145)

## 2019-01-14 LAB — TSH: TSH: 1.946 u[IU]/mL (ref 0.350–4.500)

## 2019-01-14 LAB — ECHOCARDIOGRAM COMPLETE: Height: 71 in

## 2019-01-14 LAB — MAGNESIUM: MAGNESIUM: 1.8 mg/dL (ref 1.7–2.4)

## 2019-01-14 MED ORDER — POTASSIUM PHOSPHATES 15 MMOLE/5ML IV SOLN
30.0000 mmol | Freq: Once | INTRAVENOUS | Status: AC
  Administered 2019-01-14: 30 mmol via INTRAVENOUS
  Filled 2019-01-14: qty 10

## 2019-01-14 MED ORDER — PIPERACILLIN-TAZOBACTAM 3.375 G IVPB
3.3750 g | Freq: Three times a day (TID) | INTRAVENOUS | Status: DC
  Administered 2019-01-14 – 2019-01-15 (×4): 3.375 g via INTRAVENOUS
  Filled 2019-01-14 (×3): qty 50

## 2019-01-14 MED ORDER — VITAL AF 1.2 CAL PO LIQD
1000.0000 mL | ORAL | Status: DC
  Administered 2019-01-14: 1000 mL

## 2019-01-14 MED ORDER — PERFLUTREN LIPID MICROSPHERE
1.0000 mL | INTRAVENOUS | Status: AC | PRN
  Administered 2019-01-14: 5 mL via INTRAVENOUS
  Filled 2019-01-14: qty 10

## 2019-01-14 MED ORDER — VITAL HIGH PROTEIN PO LIQD: 1000.0000 mL | ORAL | Status: DC

## 2019-01-14 MED ORDER — MAGNESIUM SULFATE 2 GM/50ML IV SOLN
2.0000 g | Freq: Once | INTRAVENOUS | Status: AC
  Administered 2019-01-14: 2 g via INTRAVENOUS
  Filled 2019-01-14: qty 50

## 2019-01-14 MED ORDER — SODIUM CHLORIDE 0.9% FLUSH: 10.0000 mL | INTRAVENOUS | Status: DC | PRN

## 2019-01-14 MED ORDER — CHLORHEXIDINE GLUCONATE CLOTH 2 % EX PADS
6.0000 | MEDICATED_PAD | Freq: Every day | CUTANEOUS | Status: DC
  Administered 2019-01-14 – 2019-01-15 (×2): 6 via TOPICAL

## 2019-01-14 MED ORDER — SODIUM CHLORIDE 0.9% FLUSH
10.0000 mL | Freq: Two times a day (BID) | INTRAVENOUS | Status: DC
  Administered 2019-01-14: 10 mL

## 2019-01-14 MED ORDER — PERFLUTREN LIPID MICROSPHERE
INTRAVENOUS | Status: AC
  Administered 2019-01-14: 5 mL via INTRAVENOUS
  Filled 2019-01-14: qty 10

## 2019-01-14 MED ORDER — SODIUM CHLORIDE 0.9 % IV SOLN
INTRAVENOUS | Status: DC | PRN
  Administered 2019-01-14 – 2019-01-15 (×2): 250 mL via INTRAVENOUS

## 2019-01-14 NOTE — Progress Notes (Signed)
Called and spoke with Gregory [name]- patient's niece and reported HPOA.  Patient's wife still resides in SNF.  She has spoken with Dr. Rory Percy and is aware of the EEG results and recommendations from neurology of no significant change of any meaningful neurological recovery.  Family at this time wishes to pursue comfort measures.  Patient is DNR.   Leafy Ro will check with the SNF to see if patient's wife will be able to leave SNF and return.   Will plan tentatively for transition to comfort care 3/26 around 1300.    Kennieth Rad, MSN, AGACNP-BC [facility] Pgr: [phone] or if no answer [phone] 01/14/2019, 5:33 PM
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
## 2019-01-14 NOTE — Progress Notes (Signed)
EEG Complete. Results pending. 

## 2019-01-14 NOTE — Progress Notes (Addendum)
NAME:  Gregory [name], MRN:  [id number], DOB:  1933-09-03, LOS: 1 ADMISSION DATE:  01/14/2019, CONSULTATION DATE:  3/24 REFERRING MD:  Dr. Sabra Heck, CHIEF COMPLAINT:  PEA   Brief History   85 yoM from SNF- mostly bed bound w/hx congestive heart failure, diabetes mellitus, ischemic cardiomyopathy, and myocardial infarction , w/PEA arrest 3/24 while eating lunch and likely aspirating and re- arrest w/ EMS.   Past Medical History   has a past medical history of Abnormality of gait (09/19/2016), Allergy, Cataract, CHF (congestive heart failure) ([place]), Colon polyps, Diabetes mellitus, Diabetic neuropathy ([place]), Diverticulosis, Dyslipidemia, Essential hypertension, Glaucoma, Hyperlipidemia, Hypothyroidism, ICD (implantable cardiac defibrillator) in place (11-2004), Internal hemorrhoid, Ischemic cardiomyopathy, Ischemic heart disease, and Myocardial infarction ([place]) (2006).  Significant Hospital Events   3/24 admit for cardiac arrest  Consults:    Procedures:  ETT 3/24 > RIJ CVL 3/24 >  Significant Diagnostic Tests:  EEG 3/24 > TTE 3/25 > Chronic foley   Micro Data:  Blood cx 3/24 > Tracheal asp 3/24 > MRSA PCR 3/24 > neg UC 3/24 >   Antimicrobials:  Vancomycin 3/24 x 1 zosyn 3/24 >  Interim history/subjective:  Afebrile Minimal oxygenation requirements hemodynamically stable No sedation since 3/24 @ 1454, no improvement in mental status   Objective   Blood pressure 110/78, pulse 86, temperature 97.6 F (36.4 C), temperature source Oral, resp. rate 12, height 5\' 11"  (1.803 m), SpO2 100 %. CVP:  [1 mmHg-7 mmHg] 2 mmHg  Vent Mode: PRVC FiO2 (%):  [40 %-100 %] 40 % Set Rate:  [12 bmp-18 bmp] 12 bmp Vt Set:  [590 mL-600 mL] 600 mL PEEP:  [5 cmH20] 5 cmH20 Plateau Pressure:  [22 cmH20-29 cmH20] 28 cmH20   Intake/Output Summary (Last 24 hours) at 01/14/2019 0900 Last data filed at 01/14/2019 0600 Gross per 24 hour  Intake 194.08 ml  Output 355 ml  Net -160.92 ml   There  were no vitals filed for this visit.  Examination: General:  emacin elderly male in no distress on vent HEENT: MM pink/moist, right eye missing, left eye pupil 4/reactive with lateral nystagmus with conjunctival redness and drainage, + corneal - orbital area with erythema Neuro:  Unresponsive. No response to noxious stimuli CV: intermittently vpaced, no murmur PULM: even/non-labored on full MV support, lungs bilaterally coarse, right insp wheeze, - placed on PSV- no breath triggered OJ:[id number],  hypobs Extremities: muscle atrophy, warm/dry, no edema, right great toe dry necrotic   Skin: no rashes, multiple pressure wounds  Resolved Hospital Problem list    Assessment & Plan:   Cardiac arrest: secondary to food aspiration and hypoxia. PEA arrest. Rather brief once CPR initiated, but sounds like up to 15 mins of downtime without resuscitative efforts. Not candidate for TTM therapy - concern for significant neurological injury given ongoing poor neurological exam P:  Avoid fever with PRN tylenol and bair hugger if needed.  EEG pending ABG now, rule out metabolic abnormalities  Ongoing neuro exams Given his baseline poor functional status and now poor neurological status post prolonged arrest, prognosis remains very poor  Aspiration event: - minimal vent requirements  - empiric zosyn  - Follow cultures as above   Inability to protect airway s/p cardiac arrest - Full vent support - Mental status prevents PSV trials - VAP bundle - Follow CXR, ABG now  History of ventricular arrythmia - continue amiodarone - ICD in place.   Shock: resolved  - telemetry monitoring  Chronic systolic CHF: secondary to ischemic  cardiomyopathy. Most recent echo in 2015 with EF 25-30% - Echo pending  Hypothyroid - TSH ordered - continue home synthroid  DM - CBG monitoring and SSI q 4 hours PRN  Chronic foley - follow culture  Hypokalemia, mag, phos P:  Kphos 30 mmol now, Mag 2 gm  now Trend renal panel/ mag  Protein calorie malnutrition P:  Start TF, monitor for refeeding syndrome  Multiple pressure wounds Right dry gangrene toe P:  Maximize nutrition  Best practice:  Diet: NPO Pain/Anxiety/Delirium protocol (if indicated): hold sedation VAP protocol (if indicated): Yes DVT prophylaxis: heparin SQ GI prophylaxis: Pepcid Glucose control: CBG monitoring and SSI Mobility: BR Code Status: DNR Family Communication: Niece Leafy Ro who is also POA updated 3/24-. She elects to pursue DNR should he arrest again, but would be ok with low dose pressors if able to buy [place] time to evaluate his neurologic recovery. If pressors escalating would likely want to consider comfort measures.  Will await for todays EEG and ABG prior to calling family, however prognosis remains grim. Disposition: ICU  Labs   CBC: Recent Labs  Lab 01/16/2019 1452 01/11/2019 1544 01/14/19 0519  WBC 20.3*  --  16.4*  NEUTROABS 12.2*  --   --   HGB 11.5* 12.2* 11.8*  HCT 38.5* 36.0* 36.0*  MCV 100.5*  --  91.8  PLT 283  --  188    Basic Metabolic Panel: Recent Labs  Lab 01/16/2019 1452 01/07/2019 1500 01/07/2019 1544 01/14/19 0519  NA 133*  --  133* 134*  K 3.5  --  3.1* 3.2*  CL 98  --   --  95*  CO2 27  --   --  27  GLUCOSE 230*  --   --  143*  BUN 14  --   --  19  CREATININE 1.01 0.80  --  0.88  CALCIUM 8.0*  --   --  8.3*  MG  --   --   --  1.8  PHOS  --   --   --  1.4*   GFR: CrCl cannot be calculated (Unknown ideal weight.). Recent Labs  Lab 12/25/2018 1452 01/14/19 0519  WBC 20.3* 16.4*    Liver Function Tests: Recent Labs  Lab 01/01/2019 1452  AST 55*  ALT 28  ALKPHOS 135*  BILITOT 0.8  PROT 5.8*  ALBUMIN 1.7*   No results for input(s): LIPASE, AMYLASE in the last 168 hours. No results for input(s): AMMONIA in the last 168 hours.  ABG    Component Value Date/Time   PHART 7.532 (H) 12/26/2018 1840   PCO2ART 31.5 (L) 01/20/2019 1840   PO2ART 74.1 (L) 12/22/2018  1840   HCO3 26.3 12/31/2018 1840   TCO2 31 01/20/2019 1544   O2SAT 96.1 01/07/2019 1840     Coagulation Profile: Recent Labs  Lab 12/31/2018 1452  INR 1.5*    Cardiac Enzymes: No results for input(s): CKTOTAL, CKMB, CKMBINDEX, TROPONINI in the last 168 hours.  HbA1C: Hgb A1c MFr Bld  Date/Time Value Ref Range Status  12/31/2011 12:20 PM 9.3 (H) 4.6 - 6.5 % Final    Comment:    Glycemic Control Guidelines for People with Diabetes:Non Diabetic:  <6%Goal of Therapy: <7%Additional Action Suggested:  >8%     CBG: Recent Labs  Lab 12/24/2018 1851 01/02/2019 2109 01/14/19 0033 01/14/19 0518  GLUCAP 276* 165* 101* 130*    Critical care time: 35 mins    Kennieth Rad, MSN, AGACNP-BC [facility] Pgr:  [phone] or if no answer [phone] 01/14/2019, 9:56 AM  Attending Note:  85 year old male with extensive cardiac history.  No events overnight.  No sedation.  Patient remains completely unresponsive on exam.  I reviewed CXR myself, ETT is in a good position.  EEG reviewed, spikes noted.  Informed family over the phone that patient remains unresponsive.  We are awaiting TSH.  Patient is now DNR.  Once EEG results then will consider comfort measures.  The patient is critically ill with multiple organ systems failure and requires high complexity decision making for assessment and support, frequent evaluation and titration of therapies, application of advanced monitoring technologies and extensive interpretation of multiple databases.   Critical Care Time devoted to patient care services described in this note is  33  Minutes. This time reflects time of care of this signee Dr Jennet Maduro. This critical care time does not reflect procedure time, or teaching time or supervisory time of PA/NP/Med student/Med Resident etc but could involve care discussion time.  [name] Farmer, M.D. [facility] Pulmonary/Critical Care Medicine. Pager: [phone]. After hours pager:  [phone].

## 2019-01-14 NOTE — Procedures (Signed)
ELECTROENCEPHALOGRAM REPORT   Patient: Gregory [name]       Room #: [facility] EEG No. ID: [id number] Age: 85 y.o.        Sex: male Referring Physician: Agarwala Report Date:  01/14/2019        Interpreting Physician: Alexis Goodell  History: Gregory [name] is an 85 y.o. male s/p arrest  Medications:  Pacerone, Pepcid, Insulin, Synthroid, Zosyn, Fentanyl  Conditions of Recording:  This is a 21 channel routine scalp EEG performed with bipolar and monopolar montages arranged in accordance to the international 10/20 system of electrode placement. One channel was dedicated to EKG recording.  The patient is in the intubated and sedated state.  Description:  The background activity is discontinuous. It consists of bursts of polyspike, spike and slow wave activity alternating with periods of attenuation. The burst activity lasts up to 3 seconds.  There are two periods of prolonged burst activity lasting 8 seconds on one occasion and 15 seconds on another occasion.  During these periods burst activity is rhythmic at a frequency of approximately 2 Hz.  There is no change in clinical activity during this time.  The periods of attenuation last up to 3 seconds.  This discontinuous activity is maintained throughout the tracing.  Painful stimulation is applied during the recording with no change in the background rhythm noted.   Hyperventilation and intermittent photic stimulation were not performed.   IMPRESSION: This is an abnormal EEG due to a burst-suppression pattern seen throughout the tracing.  Burst suppression pattern can be seen in a variety of circumstances, including anesthesia, drug intoxication, hypothermia, as well as cerebral anoxia.  Clinical/neurological, and radiographic correlation advised.   Also noted were two periods of rhythmic burst activity at a frequency of 2Hz .  Can not rule out the possibility of electrographic seizure activity on these occasions.     Alexis Goodell,  MD Neurology [phone] 01/14/2019, 11:17 AM

## 2019-01-14 NOTE — Clinical Social Work Note (Signed)
Pt on the ventilator. Unable to complete readmission risk screening at this time.   [place], [place]

## 2019-01-14 NOTE — Consult Note (Signed)
Neurology Consultation  Reason for Consult: Prognostication post cardiac arrest Referring Physician: Dr. Alma Downs  CC: Status post cardiac arrest-neurological prognostication  History is obtained from: Chart review  HPI: Gregory [name] is a 85 y.o. male who has a past medical history of hypertension, hyperlipidemia, ischemic cardiomyopathy status post ICD placement, congestive heart failure, diabetes and dyslipidemia, brought in on 01/12/2019 from skilled nursing facility where he resides with his wife since May 2019, when he suffered a cardiac arrest at lunch. Chart review reveals that on 12/25/2018 he and his wife are eating lunch when he appeared to choke and subsequently became unresponsive, with EMS called afterwards, they responded within 15 minutes, patient found to be in PEA and had a ROSC after 1 round of epinephrine in conjunction with CPR.  He then had another cardiac arrest briefly which also required 1 dose of epinephrine and CPR to be resolved.  He was brought into the [facility] emergency room where he was found to have copious amounts of respiratory secretions, remained hemodynamically unstable, was intubated and admitted to the ICU. At baseline, he is bedridden and barely gets out of the bed anymore.   Modified Rankin of 4-5  ROS: ROS was performed and is negative except as noted in the HPI  Past Medical History:  Diagnosis Date  . Abnormality of gait 09/19/2016  . Allergy   . Cataract   . CHF (congestive heart failure) ([place])   . Colon polyps   . Diabetes mellitus   . Diabetic neuropathy ([place])   . Diverticulosis   . Dyslipidemia   . Essential hypertension   . Glaucoma   . Hyperlipidemia   . Hypothyroidism   . ICD (implantable cardiac defibrillator) in place 11-2004  . Internal hemorrhoid   . Ischemic cardiomyopathy   . Ischemic heart disease   . Myocardial infarction [facility]) 2006   LARGE ANTERIOR WALL    Family History  Problem Relation Age of Onset  .  Diabetes Mother   . Prostate cancer Father   . Cancer Daughter        ? unknown, in stomach  . Diabetes Unknown   . Colon cancer Neg Hx   . Esophageal cancer Neg Hx   . Rectal cancer Neg Hx   . Stomach cancer Neg Hx    Social History:   reports that he quit smoking about 14 years ago. His smoking use included cigarettes. He has never used smokeless tobacco. He reports that he does not drink alcohol or use drugs.  Medications  Current Facility-Administered Medications:  .  acetaminophen (TYLENOL) tablet 650 mg, 650 mg, Oral, Q4H PRN, Corey Harold, NP .  amiodarone (PACERONE) tablet 100 mg, 100 mg, Per Tube, q morning - 10a, Corey Harold, NP, 100 mg at 01/14/19 0940 .  chlorhexidine gluconate (MEDLINE KIT) (PERIDEX) 0.12 % solution 15 mL, 15 mL, Mouth Rinse, BID, Agarwala, Ravi, MD, 15 mL at 01/14/19 0909 .  famotidine (PEPCID) IVPB 20 mg in NS 100 mL IVPB, 20 mg, Intravenous, Q12H, Corey Harold, NP, Stopped at 01/14/19 1025 .  feeding supplement (VITAL AF 1.2 CAL) liquid 1,000 mL, 1,000 mL, Per Tube, Continuous, Jennelle Human B, NP, Last Rate: 20 mL/hr at 01/14/19 1300 .  fentaNYL 2520mg in NS 2568m(1028mml) infusion-PREMIX, 0-400 mcg/hr, Intravenous, Continuous, MilNoemi ChapelD, Stopped at 01/12/2019 1542 .  heparin injection 5,000 Units, 5,000 Units, Subcutaneous, Q8H, HofCorey HaroldP, 5,000 Units at 01/14/19 1426 .  insulin aspart (novoLOG) injection  0-9 Units, 0-9 Units, Subcutaneous, Q4H, Corey Harold, NP, 2 Units at 01/14/19 1213 .  levothyroxine (SYNTHROID, LEVOTHROID) tablet 137 mcg, 137 mcg, Per Tube, Daily, Corey Harold, NP, 137 mcg at 01/14/19 0630 .  MEDLINE mouth rinse, 15 mL, Mouth Rinse, 10 times per day, Agarwala, Ravi, MD, 15 mL at 01/14/19 1426 .  piperacillin-tazobactam (ZOSYN) IVPB 3.375 g, 3.375 g, Intravenous, Q8H, Simpson, Paula B, NP, Last Rate: 12.5 mL/hr at 01/14/19 1300 .  potassium PHOSPHATE 30 mmol in dextrose 5 % 500 mL infusion, 30 mmol,  Intravenous, Once, Jennelle Human B, NP, Last Rate: 85 mL/hr at 01/14/19 1400  Exam: Current vital signs: BP 110/68   Pulse 88   Temp 98.6 F (37 C) (Oral)   Resp 12   Ht '5\' 11"'  (1.803 m)   Wt 66.9 kg   SpO2 100%   BMI 20.57 kg/m  Vital signs in last 24 hours: Temp:  [97.5 F (36.4 C)-98.9 F (37.2 C)] 98.6 F (37 C) (03/25 1527) Pulse Rate:  [71-106] 88 (03/25 1518) Resp:  [12-26] 12 (03/25 1518) BP: (107-134)/(57-81) 110/68 (03/25 1518) SpO2:  [98 %-100 %] 100 % (03/25 1518) FiO2 (%):  [40 %] 40 % (03/25 1518) Weight:  [66.9 kg] 66.9 kg (03/25 0900) General: Intubated on no sedation HEENT: Normocephalic atraumatic Lungs: Clear to auscultation Cardiovascular: S1-2 heard, regular rate rhythm, normotensive Abdomen: Nondistended nontender Neurological exam Intubated, no sedation No spontaneous movements Breathing with the ventilator Sluggish left pupil, right eye  Gag, cough weakly present No withdrawal to nox stim   Labs I have reviewed labs in epic and the results pertinent to this consultation are: Leukocytosis, hyponatremia, hyperglycemia, anemia CBC    Component Value Date/Time   WBC 16.4 (H) 01/14/2019 0519   RBC 3.92 (L) 01/14/2019 0519   HGB 11.2 (L) 01/14/2019 1522   HCT 33.0 (L) 01/14/2019 1522   HCT 23.2 (L) 08/03/2018 0216   PLT 255 01/14/2019 0519   MCV 91.8 01/14/2019 0519   MCH 30.1 01/14/2019 0519   MCHC 32.8 01/14/2019 0519   RDW 13.7 01/14/2019 0519   LYMPHSABS 5.9 (H) 01/04/2019 1452   MONOABS 1.6 (H) 01/12/2019 1452   EOSABS 0.6 (H) 12/31/2018 1452   BASOSABS 0.0 12/29/2018 1452   CMP     Component Value Date/Time   NA 128 (L) 01/14/2019 1522   K 4.0 01/14/2019 1522   CL 95 (L) 01/14/2019 0519   CO2 27 01/14/2019 0519   GLUCOSE 143 (H) 01/14/2019 0519   BUN 19 01/14/2019 0519   CREATININE 0.88 01/14/2019 0519   CALCIUM 8.3 (L) 01/14/2019 0519   PROT 5.8 (L) 01/04/2019 1452   PROT 6.7 09/19/2016 1507   ALBUMIN 1.7 (L)  01/03/2019 1452   AST 55 (H) 01/12/2019 1452   ALT 28 01/07/2019 1452   ALKPHOS 135 (H) 12/23/2018 1452   BILITOT 0.8 12/31/2018 1452   GFRNONAA >60 01/14/2019 0519   GFRAA >60 01/14/2019 0519   EEG done today shows G peds with completely silent background. Official read of the EEG is burst suppression with discontinuous background but I think there is nearly absent background.  Assessment: 85 year old man, past medical history of hypertension hyperlipidemia ischemic cardiomyopathy CHF diabetes brought in with at least 15 to 20-minute downtime post cardiac arrest and then another brief cardiac arrest, continues to be unresponsive on life support. His examination at this time is dismal- only suggestive of some brainstem function but no higher cortical function.  His EEG  also reveals a nearly silent background. At baseline, he is very debilitated and over the past few weeks to months has had a significant decline in his baseline functional status. I spoke in depth with his healthcare power of attorney, Berline Lopes who is his niece and explained the situation.  She said that he has made it very clear that he would not like to be on prolonged life support if such a situation came. I discussed my assessment with her explaining dismal chance of any meaningful neurological recovery, given the above findings as well as the poor baseline. I discussed the option of pursuing comfort measures only, which the family is amenable to. I will relay my plan to the primary team, who can pursue this further.  Impression: Anoxic brain injury status post cardiac arrest  Recommendations: At this time, there is not a significant chance of any meaningful neurological recovery.  This has been discussed in detail with a healthcare power of attorney over the phone. The family is interested in pursuing comfort measures only.  They would like for the wife to be able to see him, who is also a nursing home resident and  elderly herself. I would defer to the primary team to coordinate this and answer all the other questions that the family might have prior to initiating comfort measures. Neurology services will be available as needed.  Please call us with questions.  Amie Portland, MD [facility] Pager: [phone] If 7pm to 7am, please call on call as listed on AMION.   CRITICAL CARE ATTESTATION Performed by: Amie Portland, MD Total critical care time: 45 minutes Critical care time was exclusive of separately billable procedures and treating other patients and/or supervising APPs/Residents/Students Critical care was necessary to treat or prevent imminent or life-threatening deterioration due to anoxic brain injury. This patient is critically ill and at significant risk for neurological worsening and/or death and care requires constant monitoring. Critical care was time spent personally by me on the following activities: development of treatment plan with patient and/or surrogate as well as nursing, discussions with consultants, evaluation of patient's response to treatment, examination of patient, obtaining history from patient or surrogate, ordering and performing treatments and interventions, ordering and review of laboratory studies, ordering and review of radiographic studies, pulse oximetry, re-evaluation of patient's condition, participation in multidisciplinary rounds and medical decision making of high complexity in the care of this patient.

## 2019-01-14 NOTE — Progress Notes (Addendum)
  Echocardiogram 2D Echocardiogram Limited with Definty has been performed due to contact precautions.  Gregory [name] 01/14/2019, 8:57 AM

## 2019-01-15 DIAGNOSIS — Z7189 Other specified counseling: Secondary | ICD-10-CM

## 2019-01-15 LAB — RENAL FUNCTION PANEL
ALBUMIN: 1.8 g/dL — AB (ref 3.5–5.0)
Anion gap: 11 (ref 5–15)
BUN: 24 mg/dL — ABNORMAL HIGH (ref 8–23)
CO2: 27 mmol/L (ref 22–32)
Calcium: 7.7 mg/dL — ABNORMAL LOW (ref 8.9–10.3)
Chloride: 96 mmol/L — ABNORMAL LOW (ref 98–111)
Creatinine, Ser: 1.03 mg/dL (ref 0.61–1.24)
GFR calc Af Amer: >60 mL/min (ref >60)
GFR calc non Af Amer: >60 mL/min (ref >60)
Glucose, Bld: 136 mg/dL — ABNORMAL HIGH (ref 70–99)
Phosphorus: 4.2 mg/dL (ref 2.5–4.6)
Potassium: 3.2 mmol/L — ABNORMAL LOW (ref 3.5–5.1)
Sodium: 134 mmol/L — ABNORMAL LOW (ref 135–145)

## 2019-01-15 LAB — CBC
HCT: 32.7 % — ABNORMAL LOW (ref 39.0–52.0)
Hemoglobin: 10.5 g/dL — ABNORMAL LOW (ref 13.0–17.0)
MCH: 30 pg (ref 26.0–34.0)
MCHC: 32.1 g/dL (ref 30.0–36.0)
MCV: 93.4 fL (ref 80.0–100.0)
Platelets: 234 10*3/uL (ref 150–400)
RBC: 3.5 MIL/uL — ABNORMAL LOW (ref 4.22–5.81)
RDW: 14.6 % (ref 11.5–15.5)
WBC: 17.7 10*3/uL — ABNORMAL HIGH (ref 4.0–10.5)
nRBC: 0 % (ref 0.0–0.2)

## 2019-01-15 LAB — GLUCOSE, CAPILLARY
Glucose-Capillary: 120 mg/dL — ABNORMAL HIGH (ref 70–99)
Glucose-Capillary: 133 mg/dL — ABNORMAL HIGH (ref 70–99)
Glucose-Capillary: 145 mg/dL — ABNORMAL HIGH (ref 70–99)
Glucose-Capillary: 151 mg/dL — ABNORMAL HIGH (ref 70–99)

## 2019-01-15 LAB — MAGNESIUM: MAGNESIUM: 2.2 mg/dL (ref 1.7–2.4)

## 2019-01-15 MED ORDER — POTASSIUM CHLORIDE 10 MEQ/50ML IV SOLN
10.0000 meq | INTRAVENOUS | Status: AC
  Administered 2019-01-15 (×4): 10 meq via INTRAVENOUS
  Filled 2019-01-15 (×4): qty 50

## 2019-01-15 MED ORDER — MORPHINE SULFATE (PF) 2 MG/ML IV SOLN: 2.0000 mg | INTRAVENOUS | Status: DC | PRN

## 2019-01-15 MED ORDER — GLYCOPYRROLATE 0.2 MG/ML IJ SOLN: 0.2000 mg | Freq: Four times a day (QID) | INTRAMUSCULAR | Status: DC | PRN

## 2019-01-15 MED ORDER — MORPHINE 100MG IN NS 100ML (1MG/ML) PREMIX INFUSION
0.0000 mg/h | INTRAVENOUS | Status: DC
  Administered 2019-01-15: 5 mg/h via INTRAVENOUS
  Filled 2019-01-15 (×2): qty 100

## 2019-01-15 MED ORDER — POLYVINYL ALCOHOL 1.4 % OP SOLN
1.0000 [drp] | Freq: Four times a day (QID) | OPHTHALMIC | Status: DC | PRN
  Filled 2019-01-15: qty 15

## 2019-01-15 MED ORDER — MORPHINE BOLUS VIA INFUSION
5.0000 mg | INTRAVENOUS | Status: DC | PRN
  Filled 2019-01-15: qty 5

## 2019-01-16 LAB — CULTURE, RESPIRATORY W GRAM STAIN

## 2019-01-16 LAB — CULTURE, RESPIRATORY: CULTURE: NORMAL

## 2019-01-19 ENCOUNTER — Telehealth: Payer: Self-pay | Admitting: Pulmonary Disease

## 2019-01-19 NOTE — Telephone Encounter (Incomplete)
01/19/19 received D/C from [place]. Will send to Dr.Yacoub at 19M to Sign. PWR

## 2019-01-21 ENCOUNTER — Telehealth: Payer: Self-pay

## 2019-01-21 NOTE — Death Summary Note (Addendum)
Pt expired at 21:45. Asystole strip printed, placed in chart. No heart sounds auscultated by this RN or by primary RN London Pepper. Warren Lacy MD and family notified. CDS notified, not a candidate for donation.   Post mortem checklist complete. To take pt down to morgue shortly.

## 2019-01-21 NOTE — Progress Notes (Signed)
RT NOTE: RT extubated patient to comfort per MD order and family wishes. RN at bedside.

## 2019-01-21 NOTE — Progress Notes (Signed)
eLink Physician-Brief Progress Note Patient Name: Gregory [name] DOB: [birth year] MRN: [id number]   Date of Service  2019-01-21  HPI/Events of Note  Hypokalemia  eICU Interventions  Elink electrolyte replacement protocol for K+        [name] 21-Jan-2019, 5:23 AM

## 2019-01-21 NOTE — Progress Notes (Addendum)
NAME:  Gregory [name], MRN:  [id number], DOB:  [birth year], LOS: 2 ADMISSION DATE:  01/08/2019, CONSULTATION DATE:  3/24 REFERRING MD:  Dr. Sabra Heck, CHIEF COMPLAINT:  PEA   Brief History   85 yoM from SNF- mostly bed bound w/hx congestive heart failure, diabetes mellitus, ischemic cardiomyopathy, and myocardial infarction , w/PEA arrest 3/24 while eating lunch and likely aspirating and re- arrest w/ EMS.  No improvements and EEG 3/25 with burst suppression.   Past Medical History   has a past medical history of Abnormality of gait (09/19/2016), Allergy, Cataract, CHF (congestive heart failure) ([place]), Colon polyps, Diabetes mellitus, Diabetic neuropathy ([place]), Diverticulosis, Dyslipidemia, Essential hypertension, Glaucoma, Hyperlipidemia, Hypothyroidism, ICD (implantable cardiac defibrillator) in place (11-2004), Internal hemorrhoid, Ischemic cardiomyopathy, Ischemic heart disease, and Myocardial infarction ([place]) (2006).  Significant Hospital Events   3/24 admit for cardiac arrest  Consults:   Procedures:  ETT 3/24 > RIJ CVL 3/24 >  Significant Diagnostic Tests:  EEG 3/25 > diffuse burst suppresion TTE 3/25 > EF 20-25 % diffuse hypokinesis, mod reduced systolic RV Chronic foley   Micro Data:  Blood cx 3/24 >  Tracheal asp 3/24 > MRSA PCR 3/24 > neg UC 3/24 >   Antimicrobials:  Vancomycin 3/24 x 1 zosyn 3/24 >  Interim history/subjective:  yest - > TTE with EF 20-25%, EEG with diffuse burst suppression, s/p Neuro eval for prognostication with no significant chance for meaningful recovery.  No changes overnight.   Objective   Blood pressure 117/66, pulse 78, temperature (!) 97.5 F (36.4 C), temperature source Oral, resp. rate 12, height 5\' 11"  (1.803 m), weight 61 kg, SpO2 100 %. CVP:  [0 mmHg-9 mmHg] 4 mmHg  Vent Mode: PRVC FiO2 (%):  [40 %] 40 % Set Rate:  [12 bmp] 12 bmp Vt Set:  [450 mL] 450 mL PEEP:  [5 cmH20] 5 cmH20 Plateau Pressure:  [17 cmH20-20 cmH20] 19  cmH20   Intake/Output Summary (Last 24 hours) at 02-Feb-2019 3086 Last data filed at February 02, 2019 0900 Gross per 24 hour  Intake 1380.02 ml  Output 225 ml  Net 1155.02 ml   Filed Weights   01/14/19 0900 02-02-2019 0400  Weight: 66.9 kg 61 kg    Examination: General:  Thin elderly male lying in bed on MV in NAD HEENT: MM pink/moist, ETT, OGT, right eye absent, left eye -pupil 4-nonreactive, slight improvement in erythema and drainage Neuro: some movement in mouth with stimulation otherwise no changes, unresponsive CV: paced PULM: even/non-labored on MV, lungs bilaterally clear, - apneic on PSV, currently not breathing over set rate VH:QIONGEXB, hypoBS, chronic foley Extremities: warm/dry, no LE edema, bilateral boots and dry gangrene of right greater toe Skin: no rashes, multiple pressure wounds  Resolved Hospital Problem list   hypotension  Assessment & Plan:   PEA Cardiac arrest: secondary to food aspiration and hypoxia.  Anoxic encephalopathy  Acute hypoxic respiratory failure  History of ventricular arrythmia Chronic systolic CHF: secondary to ischemic cardiomyopathy Hypothyroid DM Chronic foley Protein calorie malnutrition Multiple pressure wounds Right dry gangrene toe P:  Appreciate Neurology consult for prognostication- EEG c/w severe anoxic brain injury with no significant chance of meaningful recovery.  Patient has remained unresponsive off sedation since arrest.  TTE confirms lower EF now 20-25% Have spoke to patient's niece, Gregory [name] who is his HPOA multiple time.  Patient has a wife in the SNF and two sisters.  Family is in agreement to transition to comfort care given poor prognosis.  Currently awaiting to see  if patient's wife will be able to leave SNF to be present for withdrawal of care, currently scheduled at 1pm today. Will order morphine gtt and will need to notify [employer] at withdrawal to turn off pacer.   Best practice:  Diet: NPO Pain/Anxiety/Delirium  protocol (if indicated):  VAP protocol (if indicated): Yes DVT prophylaxis: d/c GI prophylaxis: d/c Glucose control: d/c Mobility: BR Code Status: DNR Family Communication: Niece Gregory [name] is point person and HPOA as patients wife is in SNF. Disposition: ICU  Labs   CBC: Recent Labs  Lab 12/21/2018 1452 01/07/2019 1544 01/14/19 0519 01/14/19 1042 01/14/19 1522 22-Jan-2019 0345  WBC 20.3*  --  16.4*  --   --  17.7*  NEUTROABS 12.2*  --   --   --   --   --   HGB 11.5* 12.2* 11.8* 12.2* 11.2* 10.5*  HCT 38.5* 36.0* 36.0* 36.0* 33.0* 32.7*  MCV 100.5*  --  91.8  --   --  93.4  PLT 283  --  255  --   --  242    Basic Metabolic Panel: Recent Labs  Lab 01/12/2019 1452 12/26/2018 1500 01/12/2019 1544 01/14/19 0519 01/14/19 1042 01/14/19 1522 2019-01-22 0345  NA 133*  --  133* 134* 131* 128* 134*  K 3.5  --  3.1* 3.2* 3.8 4.0 3.2*  CL 98  --   --  95*  --   --  96*  CO2 27  --   --  27  --   --  27  GLUCOSE 230*  --   --  143*  --   --  136*  BUN 14  --   --  19  --   --  24*  CREATININE 1.01 0.80  --  0.88  --   --  1.03  CALCIUM 8.0*  --   --  8.3*  --   --  7.7*  MG  --   --   --  1.8  --   --  2.2  PHOS  --   --   --  1.4*  --   --  4.2   GFR: Estimated Creatinine Clearance: 46.9 mL/min (by C-G formula based on SCr of 1.03 mg/dL). Recent Labs  Lab 01/10/2019 1452 01/14/19 0519 01/22/2019 0345  WBC 20.3* 16.4* 17.7*    Liver Function Tests: Recent Labs  Lab 01/01/2019 1452 Jan 22, 2019 0345  AST 55*  --   ALT 28  --   ALKPHOS 135*  --   BILITOT 0.8  --   PROT 5.8*  --   ALBUMIN 1.7* 1.8*   No results for input(s): LIPASE, AMYLASE in the last 168 hours. No results for input(s): AMMONIA in the last 168 hours.  ABG    Component Value Date/Time   PHART 7.583 (H) 01/14/2019 1522   PCO2ART 29.5 (L) 01/14/2019 1522   PO2ART 179.0 (H) 01/14/2019 1522   HCO3 27.9 01/14/2019 1522   TCO2 29 01/14/2019 1522   O2SAT 100.0 01/14/2019 1522     Coagulation Profile: Recent Labs    Lab 01/14/2019 1452  INR 1.5*    Cardiac Enzymes: No results for input(s): CKTOTAL, CKMB, CKMBINDEX, TROPONINI in the last 168 hours.  HbA1C: Hgb A1c MFr Bld  Date/Time Value Ref Range Status  12/31/2011 12:20 PM 9.3 (H) 4.6 - 6.5 % Final    Comment:    Glycemic Control Guidelines for People with Diabetes:Non Diabetic:  <6%Goal of Therapy: <7%Additional Action Suggested:  >8%  CBG: Recent Labs  Lab 01/14/19 1528 01/14/19 2040 24-Jan-2019 0004 01/24/2019 0341 01-24-2019 0757  GLUCAP 138* 124* 133* 120* 151*    Critical care time: 35 mins    Kennieth Rad, MSN, AGACNP-BC [facility] Pgr: [phone] or if no answer [phone] Jan 24, 2019, 9:16 AM  Attending Note:  85 year old male with extensive PMH who presents to PCCM after a cardiac arrest.  No events overnight.  On exam, not following any commands with clear lungs.  I reviewed CXR myself, ETT is in a good position.  Discussed with PCCM-NP.  Communicated with family, patient is anoxic.  EEG reflect poor prognosis.  They are attempting to get the patient's wife out of SNF to come see him and plan on withdrawal to comfort at 1 PM today. Will come back and revisit with family once they arrive.  The patient is critically ill with multiple organ systems failure and requires high complexity decision making for assessment and support, frequent evaluation and titration of therapies, application of advanced monitoring technologies and extensive interpretation of multiple databases.   Critical Care Time devoted to patient care services described in this note is  32  Minutes. This time reflects time of care of this signee Dr Jennet Maduro. This critical care time does not reflect procedure time, or teaching time or supervisory time of PA/NP/Med student/Med Resident etc but could involve care discussion time.  [name] Farmer, M.D. [facility]/Critical Care Medicine. Pager: [phone]. After hours pager: [phone].

## 2019-01-21 NOTE — Telephone Encounter (Signed)
On 01/21/2019 I received dc back from Doctor Nelda Marseille. I got dc ready and called the funeral home to let them know dc was mailed to [employer]. I also faxed a copy to the funeral home per the funeral home request.

## 2019-01-21 NOTE — Progress Notes (Signed)
Pt ICD turned off at this time.

## 2019-01-21 NOTE — Progress Notes (Signed)
[facility] ADULT ICU REPLACEMENT PROTOCOL FOR AM LAB REPLACEMENT ONLY  The patient does not apply for the [facility] Adult ICU Electrolyte Replacment Protocol based on the criteria listed below:   Is urine output >/= 0.5 ml/kg/hr for the last 6 hours? No. Patient's UOP is 0.4 ml/kg/hr   Abnormal electrolyte(s): K3.2  If a panic level lab has been reported, has the CCM MD in charge been notified? Yes.  .   Physician:  Rosine Door 02/01/19 5:10 AM

## 2019-01-21 NOTE — Progress Notes (Signed)
Pt has a [employer] ICD placed in 2015 by Dr. Crissie Sickles

## 2019-01-21 NOTE — Progress Notes (Signed)
I responded to request from the nurse to provide spiritual support for the patient's family before the removal of the ventilator. I provided pastoral presence, read from the [place], and led in prayer. I remained available and present for additional support as needed or requested.    Jan 20, 2019 1335  Clinical Encounter Type  Visited With Patient and family together  Visit Type Spiritual support  Referral From Nurse  Consult/Referral To Chaplain  Spiritual Encounters  Spiritual Needs Prayer  Stress Factors  Patient Stress Factors Exhausted  Family Stress Factors Exhausted    Chaplain Dr Redgie Grayer

## 2019-01-21 NOTE — Progress Notes (Signed)
Wasted remaining morphine from comfort infusion with Dentist at 2330

## 2019-01-21 DEATH — deceased

## 2019-01-22 LAB — SUSCEPTIBILITY, AER + ANAEROB

## 2019-01-22 LAB — SUSCEPTIBILITY RESULT

## 2019-01-27 ENCOUNTER — Ambulatory Visit: Payer: PPO | Admitting: Cardiovascular Disease

## 2019-01-27 LAB — URINE CULTURE: Culture: 20000 — AB

## 2019-02-20 NOTE — Death Summary Note (Signed)
DEATH SUMMARY   Patient Details  Name: Gregory [name] MRN: [id number] DOB: 09-17-1933  Admission/Discharge Information   Admit Date:  2019/01/16  Date of Death: Date of Death: 2019/01/18  Time of Death: Time of Death: 14-Jan-2144  Length of Stay: 2  Referring Physician: Christain Sacramento, MD   Reason(s) for Hospitalization  Cardiac arrest  Diagnoses  Preliminary cause of death:   Pulseless electrical activity cardiac arrest Secondary Diagnoses (including complications and co-morbidities):  Active Problems:   Cardiac arrest ([place])   Pressure injury of skin Acute respiratory failure Anoxic brain injury  Brief Hospital Course (including significant findings, care, treatment, and services provided and events leading to death)  23 yoM from SNF- mostly bed bound w/hx congestive heart failure, diabetes mellitus, ischemic cardiomyopathy, and myocardial infarction , w/PEA arrest 01/16/2023 while eating lunch and likely aspirating and re- arrest w/ EMS.  No improvements and EEG 3/25 with burst suppression.   Communicated with family, patient is anoxic.  EEG reflect poor prognosis.  They are attempting to get the patient's wife out of SNF to come see him and plan on withdrawal to comfort.  Family arrived, asked to proceed with comfort care.  Started morphine drip.  Patient was extubated to expire shortly thereafter with the family bedside.    Pertinent Labs and Studies  Significant Diagnostic Studies Dg Chest Port 1 View  Result Date: 01/14/2019 CLINICAL DATA:  Hypoxia EXAM: PORTABLE CHEST 1 VIEW COMPARISON:  Jan 16, 2019 FINDINGS: Endotracheal tube tip is 3.0 cm above the carina. Nasogastric tube tip is below the diaphragm. Nasogastric side port is seen in the stomach region. Central catheter tip is at the cavoatrial junction. Pacemaker leads are attached to the right heart, stable. No appreciable pneumothorax. There is a small left pleural effusion with left base atelectasis. There is also mild  atelectatic change in the left mid lung region. There has been partial clearing of ill-defined opacity from the left upper lobe. The right lung remains clear. Heart size and pulmonary vascularity are normal. There is aortic atherosclerosis. Patient is status post coronary artery bypass grafting. IMPRESSION: Partial clearing of infiltrate from left upper lobe. Patchy atelectasis left mid lung and left base with small left pleural effusion. No new opacity. Stable cardiac silhouette. Tube and catheter positions as described without pneumothorax. Aortic Atherosclerosis (ICD10-I70.0). Electronically Signed   By: Lowella Grip III M.D.   On: 01/14/2019 07:55   Dg Chest Port 1 View  Result Date: 01/16/2019 CLINICAL DATA:  Endotracheal tube placement. History of ischemic cardiomyopathy, hypertension and diabetes with CHF. Query aspiration. EXAM: PORTABLE CHEST 1 VIEW COMPARISON:  08/10/2018 FINDINGS: Stable cardiomegaly with aortic atherosclerosis. Endotracheal tube tip terminates 3.7 cm above the carina in satisfactory position. Right IJ central line catheter extends into the right proximal atrium. AICD device projects over the left axilla with leads in the expected location the right ventricle. Gastric tube extends below the left hemidiaphragm. Relative hyperinflation of the right lung compared to left with left-sided volume loss, small left effusion and chronic coarsened interstitial markings noted. No definite evidence of aspiration. IMPRESSION: 1. No apparent evidence of aspiration. 2. Support line and tube positions as above with endotracheal tube approximately 3.7 cm above the carina. 3. Stable cardiomegaly with aortic atherosclerosis. Electronically Signed   By: Ashley Royalty M.D.   On: 16-Jan-2019 15:25    Microbiology Recent Results (from the past 240 hour(s))  Culture, respiratory (tracheal aspirate)     Status: None   Collection Time:  01/11/2019  9:15 PM  Result Value Ref Range Status   Specimen  Description TRACHEAL ASPIRATE  Final   Special Requests NONE  Final   Gram Stain   Final    ABUNDANT WBC PRESENT,BOTH PMN AND MONONUCLEAR RARE GRAM POSITIVE COCCI FEW YEAST    Culture   Final    FEW Consistent with normal respiratory flora. Performed at [facility], [place] [address] [place], [place] [address]    Report Status 01/16/2019 FINAL  Final  Urine culture     Status: Abnormal (Preliminary result)   Collection Time: 01/18/2019  9:15 PM  Result Value Ref Range Status   Specimen Description URINE, RANDOM  Final   Special Requests NONE  Final   Culture (A)  Final    20,000 COLONIES/mL PSEUDOMONAS AERUGINOSA Sent to [place] for further susceptibility testing. Performed at [facility], [place] [address], [place], [place] [address]    Report Status PENDING  Incomplete  MRSA PCR Screening     Status: None   Collection Time: 01/11/2019 10:31 PM  Result Value Ref Range Status   MRSA by PCR NEGATIVE NEGATIVE Final    Comment:        The GeneXpert MRSA Assay (FDA approved for NASAL specimens only), is one component of a comprehensive MRSA colonization surveillance program. It is not intended to diagnose MRSA infection nor to guide or monitor treatment for MRSA infections. Performed at [facility], [place] [address] [place], [place] [address]   Susceptibility, Aer + Anaerob     Status: None   Collection Time: 01/05/2019 11:07 PM  Result Value Ref Range Status   Suscept, Aer + Anaerob Preliminary report  Final    Comment: (NOTE) Performed At: [facility] [place], [place] [id number] [name] Farmer MD VQ:[id number]    Source of Sample  PAER SENSI URINE CULTURE  Final    Comment: Performed at [facility], [place] [address] [place], [place] [address]  Susceptibility Result     Status: Abnormal   Collection Time: 01/08/2019 11:07 PM  Result Value Ref Range Status   Suscept Result 1 Comment (A)  Final    Comment: (NOTE) Pseudomonas  aeruginosa Identification performed by account, not confirmed by this laboratory. Performed At: [facility] [place], [place] [id number] [name] Farmer MD YT:[id number]     Lab Basic Metabolic Panel: Recent Labs  Lab 01/24/2019 0345  NA 134*  K 3.2*  CL 96*  CO2 27  GLUCOSE 136*  BUN 24*  CREATININE 1.03  CALCIUM 7.7*  MG 2.2  PHOS 4.2   Liver Function Tests: Recent Labs  Lab January 24, 2019 0345  ALBUMIN 1.8*   No results for input(s): LIPASE, AMYLASE in the last 168 hours. No results for input(s): AMMONIA in the last 168 hours. CBC: Recent Labs  Lab 01/24/2019 0345  WBC 17.7*  HGB 10.5*  HCT 32.7*  MCV 93.4  PLT 234   Cardiac Enzymes: No results for input(s): CKTOTAL, CKMB, CKMBINDEX, TROPONINI in the last 168 hours. Sepsis Labs: Recent Labs  Lab 01/24/2019 0345  WBC 17.7*    Procedures/Operations     YACOUB,WESAM 01/21/2019, 5:07 PM

## 2019-07-31 IMAGING — DX DG CHEST 1V PORT
1 series · 1 of 1 positions shown · non-contrast
Comparison: Chest x-ray of August 04, 2018

CLINICAL DATA: Respiratory failure, CHF, history of previous MI
former smoker.

EXAM:
PORTABLE CHEST 1 VIEW

[chest ap]
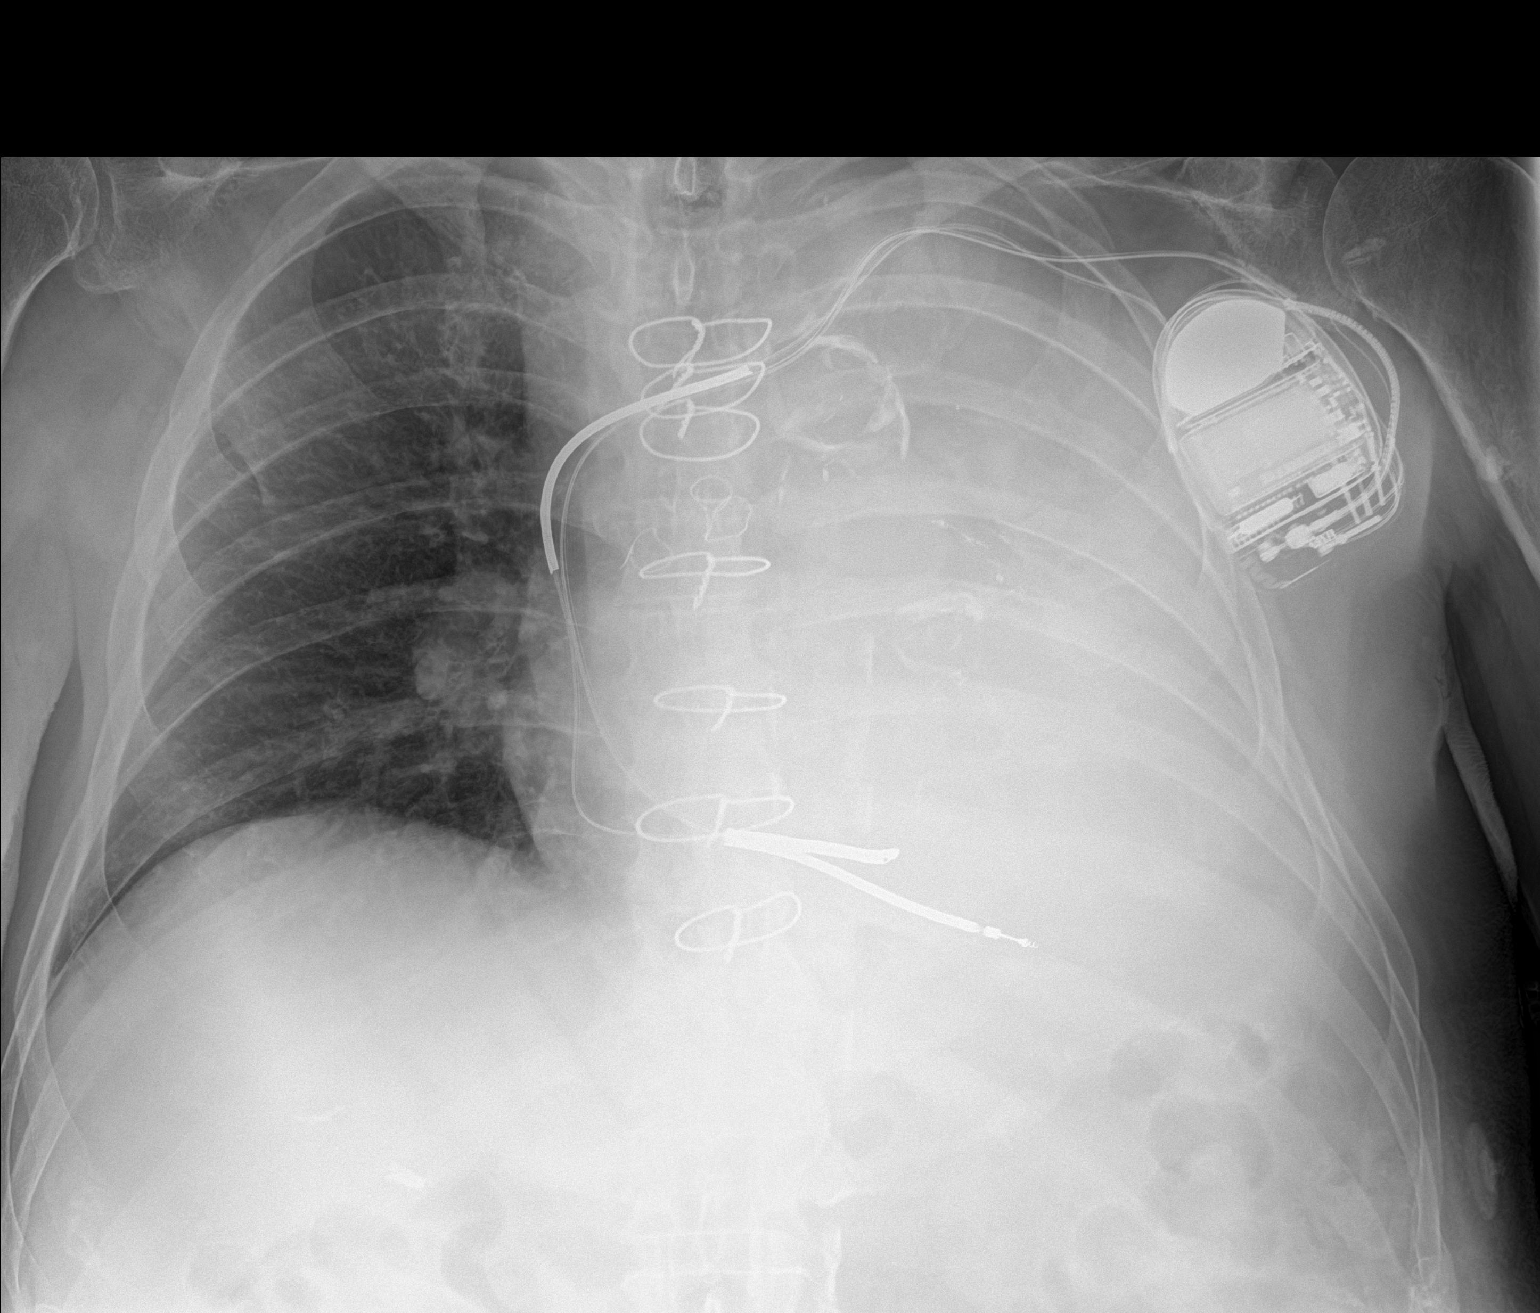

[1 of 1 positions shown; findings below may reference images not displayed]

FINDINGS: There has been further opacification of the left hemithorax such
that there is minimal if any aerated lung remaining. There is no
mediastinal shift. The right lung is adequately inflated and clear.
The pulmonary vascularity is not engorged. The ICD is in stable
position. There are post CABG changes. There is dense calcification
in the wall of the thoracic aorta.
IMPRESSION: Opacification of the left hemithorax consistent with progressive
atelectasis of the left lung. Possible pneumonia.

## 2019-08-01 IMAGING — DX DG CHEST 1V PORT
1 series · 1 of 1 positions shown · non-contrast
Comparison: 08/06/2018

CLINICAL DATA: Atelectasis

EXAM:
PORTABLE CHEST 1 VIEW

[chest]
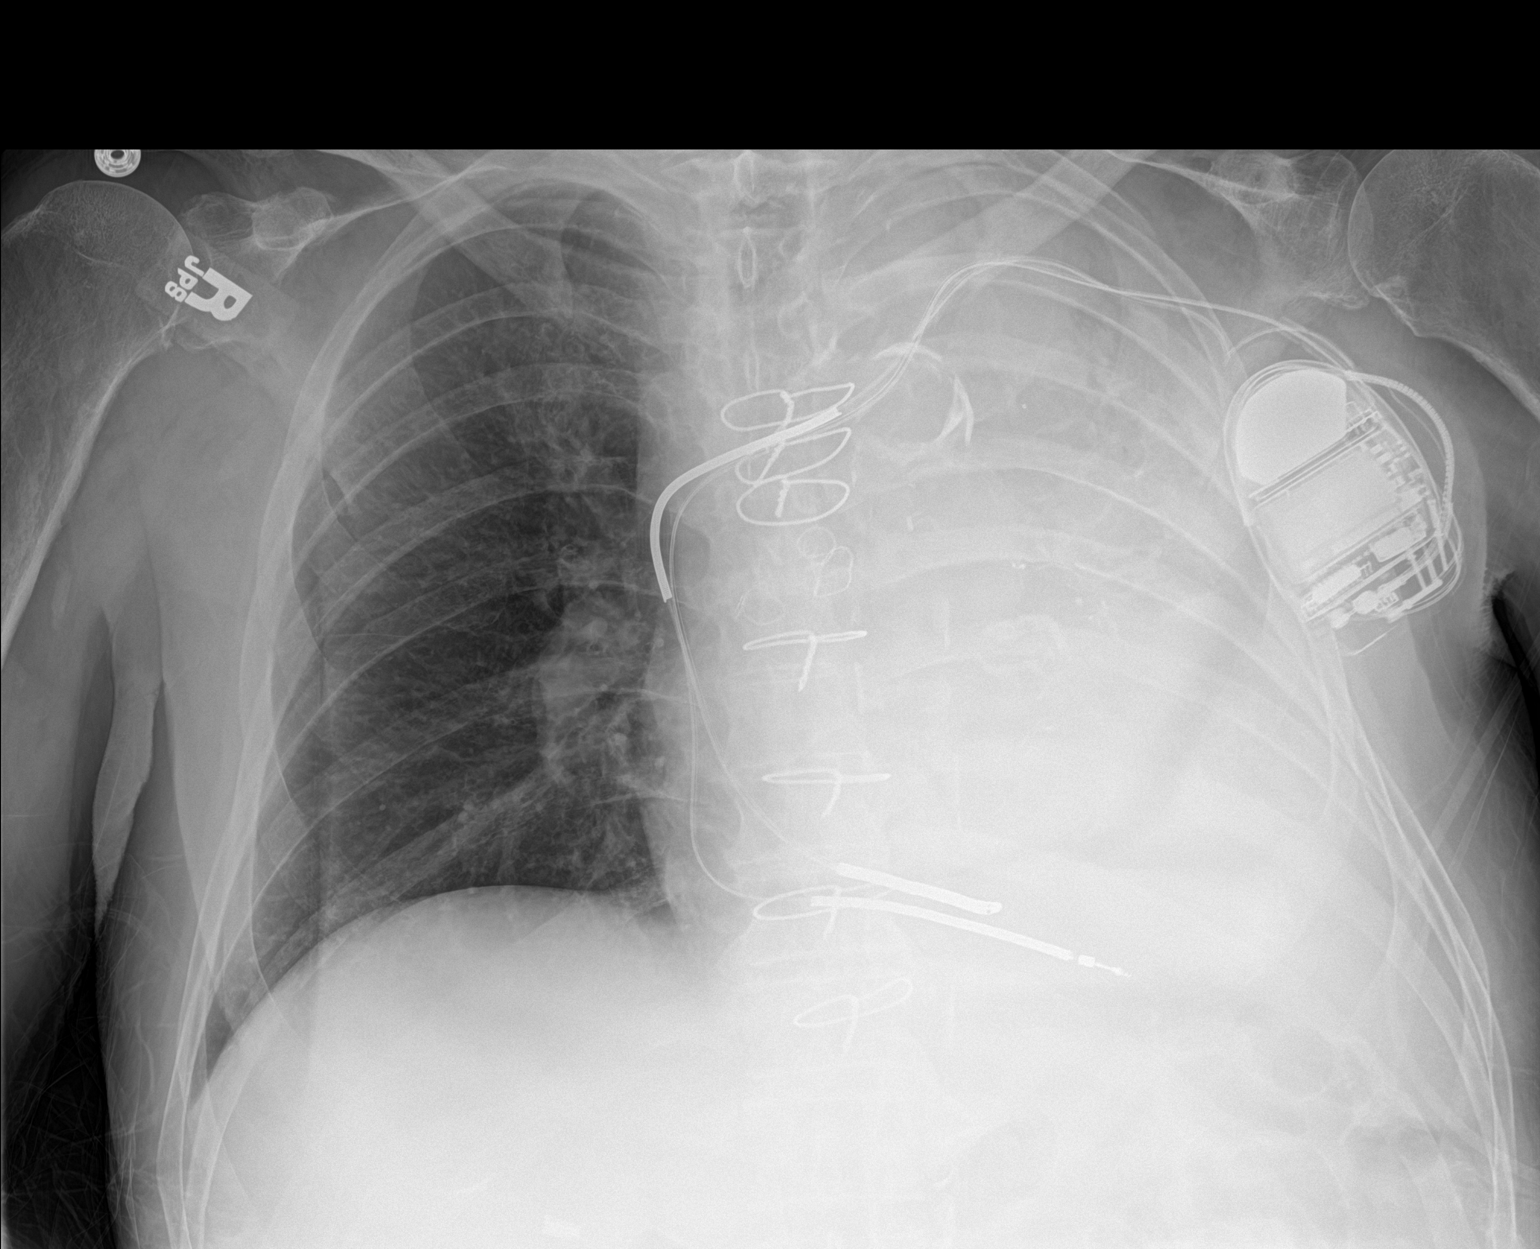

[1 of 1 positions shown; findings below may reference images not displayed]

FINDINGS: Near complete opacification in the left hemithorax is stable. Left
subclavian AICD device and leads are stable and intact. Right lung
is clear. No pneumothorax.
IMPRESSION: Stable near complete opacification in the left hemithorax which is
likely due to a combination of consolidation and volume loss.

## 2019-08-02 IMAGING — DX DG CHEST 1V PORT
1 series · 1 of 1 positions shown · non-contrast
Comparison: 08/07/2018

CLINICAL DATA: Left lung collapse

EXAM:
PORTABLE CHEST 1 VIEW

[chest]
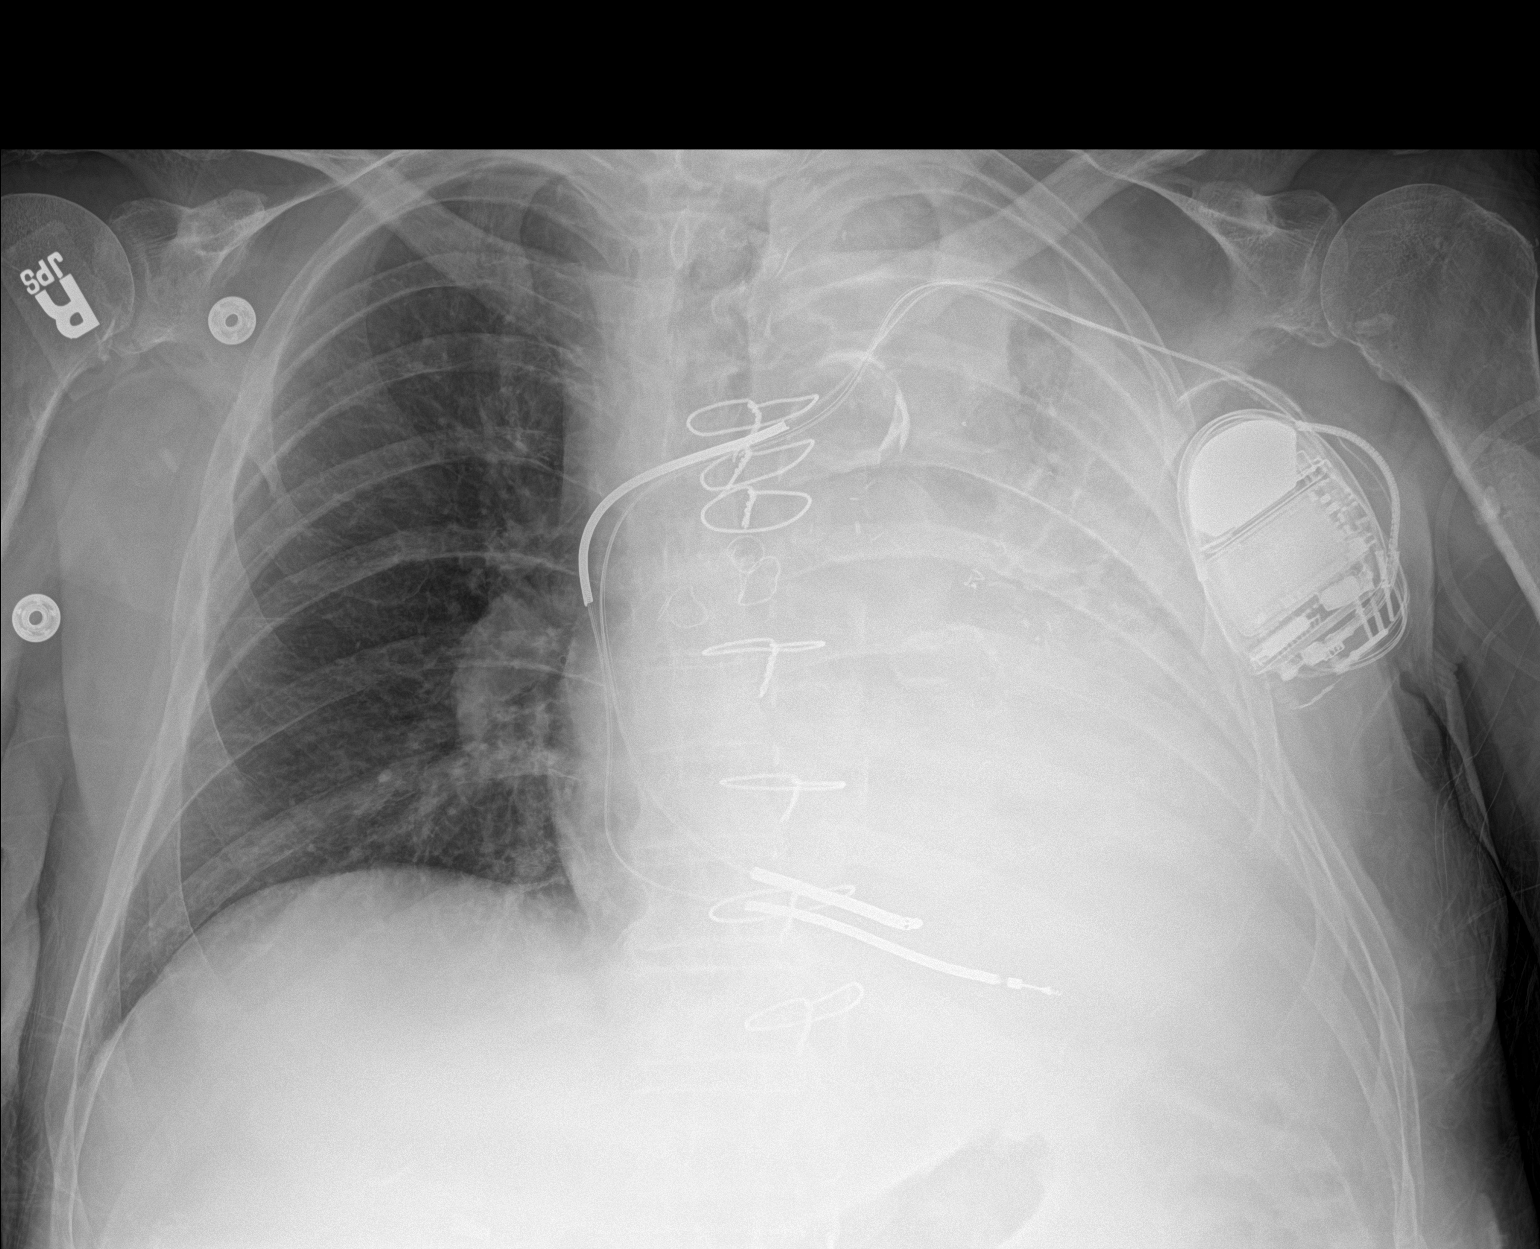

[1 of 1 positions shown; findings below may reference images not displayed]

FINDINGS: Cardiac shadow remains enlarged. Defibrillator is again noted. There
is been slight improved aeration in the left lung when compared with
the prior exam although significant opacification of left hemithorax
remains. The right lung is clear. No acute bony abnormality is
noted.
IMPRESSION: Slight improved aeration on the left when compared with the prior
exam.

## 2019-08-04 IMAGING — DX DG CHEST 1V PORT
1 series · 1 of 1 positions shown · non-contrast
Comparison: Radiograph August 09, 2018.

CLINICAL DATA: Acute respiratory failure.

EXAM:
PORTABLE CHEST 1 VIEW

[chest]
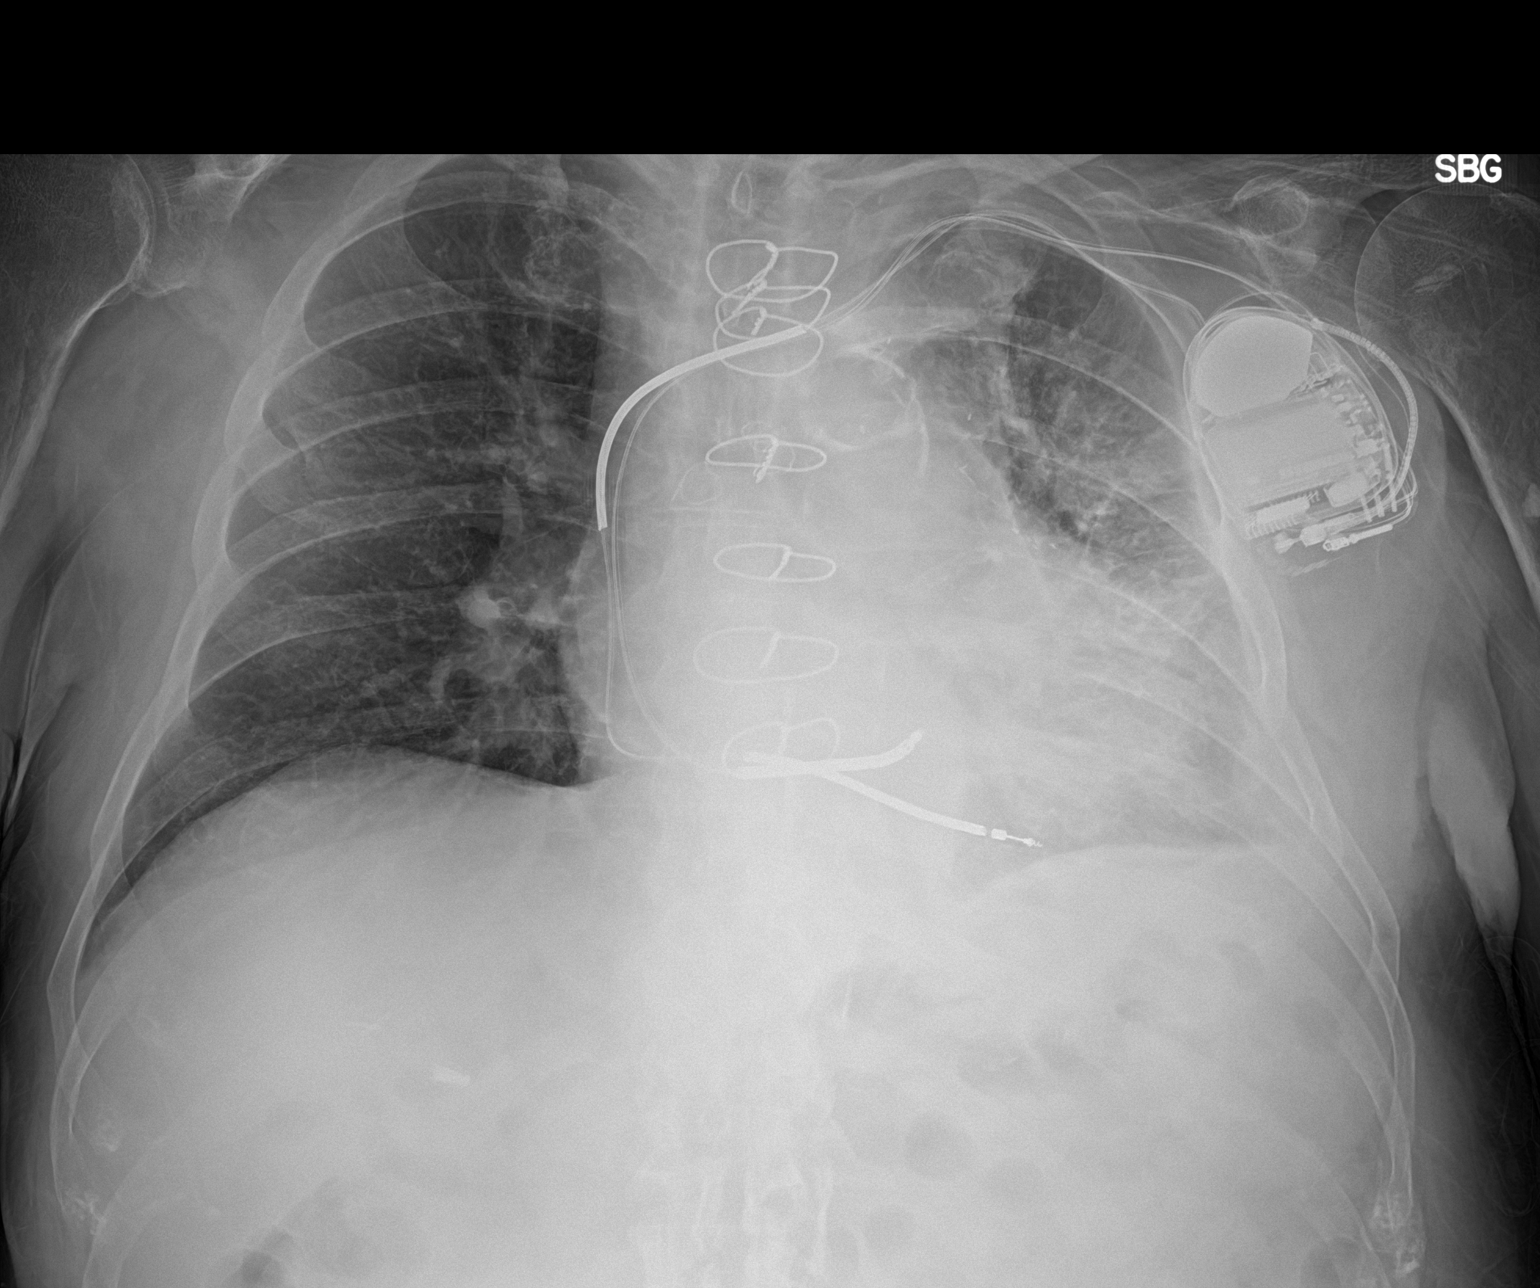

[1 of 1 positions shown; findings below may reference images not displayed]

FINDINGS: Stable cardiomegaly. Status post coronary artery bypass graft.
Left-sided pacemaker is unchanged in position. No pneumothorax is
noted. Right lung is clear. Atherosclerosis of thoracic aorta is
noted. Stable diffuse left lung opacity is noted concerning for
pneumonia with probable associated small pleural effusion. Bony
thorax is unremarkable.
IMPRESSION: Stable left lung opacity as described above.

Aortic Atherosclerosis ([id number]).

## 2020-01-07 IMAGING — DX PORTABLE CHEST - 1 VIEW
1 series · 1 of 1 positions shown · non-contrast
Comparison: 08/10/2018

CLINICAL DATA: Endotracheal tube placement. History of ischemic
cardiomyopathy, hypertension and diabetes with CHF. Query
aspiration.

EXAM:
PORTABLE CHEST 1 VIEW

[chest]
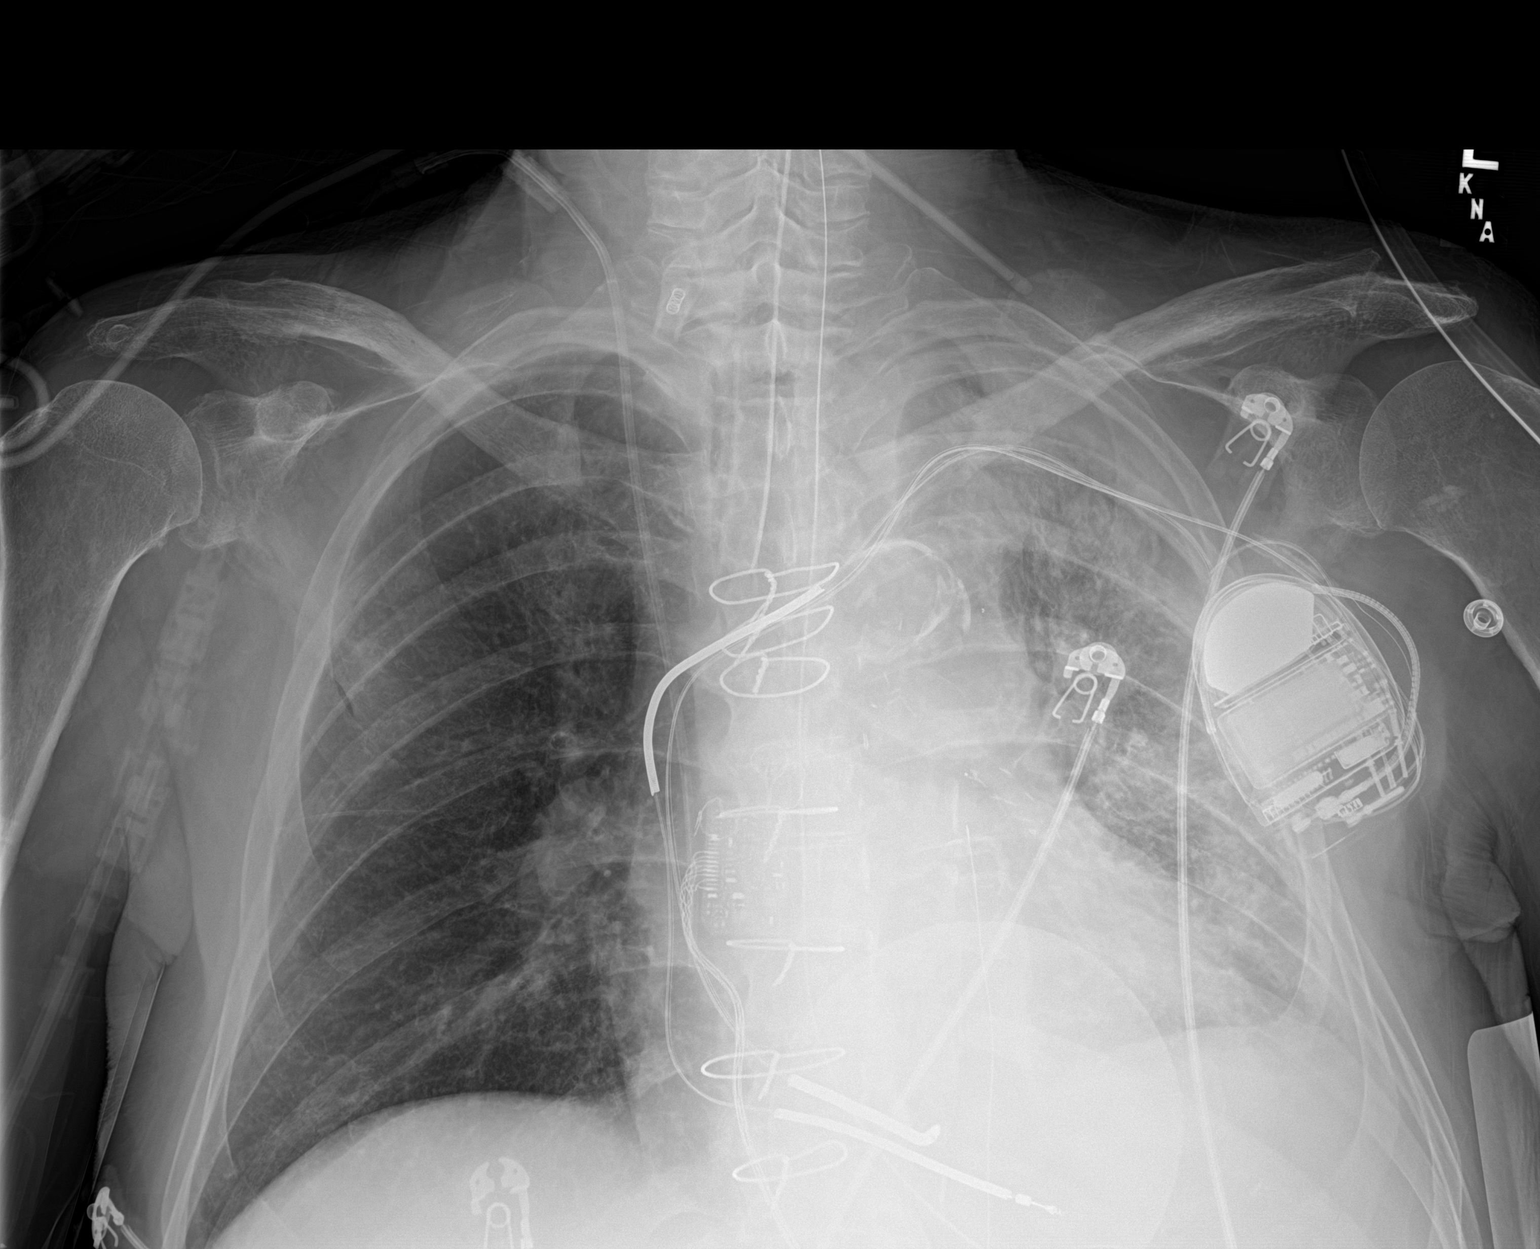

[1 of 1 positions shown; findings below may reference images not displayed]

FINDINGS: Stable cardiomegaly with aortic atherosclerosis. Endotracheal tube
tip terminates 3.7 cm above the carina in satisfactory position.
Right IJ central line catheter extends into the right proximal
atrium. AICD device projects over the left axilla with leads in the
expected location the right ventricle. Gastric tube extends below
the left hemidiaphragm.

Relative hyperinflation of the right lung compared to left with
left-sided volume loss, small left effusion and chronic coarsened
interstitial markings noted. No definite evidence of aspiration.
IMPRESSION: 1. No apparent evidence of aspiration.
2. Support line and tube positions as above with endotracheal tube
approximately 3.7 cm above the carina.
3. Stable cardiomegaly with aortic atherosclerosis.
